# Patient Record
Sex: Male | Born: 1955 | Race: Black or African American | Hispanic: No | Smoking: Never smoker
Health system: Southern US, Community
[De-identification: ages and names within clinical notes are randomized; demographics above are authoritative.]

## PROBLEM LIST (undated history)

## (undated) DIAGNOSIS — I10 Essential (primary) hypertension: Secondary | ICD-10-CM

## (undated) DIAGNOSIS — G8929 Other chronic pain: Secondary | ICD-10-CM

## (undated) DIAGNOSIS — N400 Enlarged prostate without lower urinary tract symptoms: Secondary | ICD-10-CM

## (undated) DIAGNOSIS — I5030 Unspecified diastolic (congestive) heart failure: Secondary | ICD-10-CM

## (undated) DIAGNOSIS — E785 Hyperlipidemia, unspecified: Secondary | ICD-10-CM

## (undated) DIAGNOSIS — F419 Anxiety disorder, unspecified: Secondary | ICD-10-CM

## (undated) DIAGNOSIS — R51 Headache: Secondary | ICD-10-CM

## (undated) DIAGNOSIS — N189 Chronic kidney disease, unspecified: Secondary | ICD-10-CM

## (undated) DIAGNOSIS — F1910 Other psychoactive substance abuse, uncomplicated: Secondary | ICD-10-CM

## (undated) DIAGNOSIS — Z789 Other specified health status: Secondary | ICD-10-CM

## (undated) DIAGNOSIS — K469 Unspecified abdominal hernia without obstruction or gangrene: Secondary | ICD-10-CM

## (undated) DIAGNOSIS — G919 Hydrocephalus, unspecified: Secondary | ICD-10-CM

## (undated) DIAGNOSIS — J449 Chronic obstructive pulmonary disease, unspecified: Secondary | ICD-10-CM

## (undated) HISTORY — DX: Anxiety disorder, unspecified: F41.9

## (undated) HISTORY — DX: Other specified health status: Z78.9

## (undated) HISTORY — DX: Unspecified diastolic (congestive) heart failure: I50.30

## (undated) HISTORY — PX: VENTRICULOPERITONEAL SHUNT: SHX204

## (undated) HISTORY — DX: Hyperlipidemia, unspecified: E78.5

## (undated) HISTORY — DX: Headache: R51

## (undated) HISTORY — DX: Chronic kidney disease, unspecified: N18.9

## (undated) HISTORY — DX: Benign prostatic hyperplasia without lower urinary tract symptoms: N40.0

## (undated) HISTORY — DX: Other psychoactive substance abuse, uncomplicated: F19.10

## (undated) HISTORY — DX: Chronic obstructive pulmonary disease, unspecified: J44.9

## (undated) HISTORY — DX: Hydrocephalus, unspecified: G91.9

## (undated) HISTORY — PX: BRAIN SURGERY: SHX531

## (undated) HISTORY — PX: MANDIBLE SURGERY: SHX707

---

## 2003-05-03 ENCOUNTER — Encounter: Payer: Self-pay | Admitting: Unknown Physician Specialty

## 2003-05-03 ENCOUNTER — Ambulatory Visit (HOSPITAL_COMMUNITY): Admission: RE | Admit: 2003-05-03 | Discharge: 2003-05-03 | Payer: Self-pay | Admitting: Unknown Physician Specialty

## 2003-11-05 ENCOUNTER — Emergency Department (HOSPITAL_COMMUNITY): Admission: EM | Admit: 2003-11-05 | Discharge: 2003-11-05 | Payer: Self-pay | Admitting: Emergency Medicine

## 2004-08-21 ENCOUNTER — Ambulatory Visit: Payer: Self-pay | Admitting: Family Medicine

## 2004-11-02 ENCOUNTER — Ambulatory Visit: Payer: Self-pay | Admitting: Family Medicine

## 2005-02-12 ENCOUNTER — Ambulatory Visit: Payer: Self-pay | Admitting: Family Medicine

## 2005-04-02 ENCOUNTER — Ambulatory Visit: Payer: Self-pay | Admitting: Family Medicine

## 2005-06-14 ENCOUNTER — Ambulatory Visit: Payer: Self-pay | Admitting: Family Medicine

## 2005-08-15 ENCOUNTER — Ambulatory Visit: Payer: Self-pay | Admitting: Family Medicine

## 2005-11-19 ENCOUNTER — Ambulatory Visit: Payer: Self-pay | Admitting: Family Medicine

## 2005-12-14 ENCOUNTER — Ambulatory Visit (HOSPITAL_COMMUNITY): Admission: RE | Admit: 2005-12-14 | Discharge: 2005-12-14 | Payer: Self-pay

## 2006-03-05 ENCOUNTER — Ambulatory Visit: Payer: Self-pay | Admitting: Family Medicine

## 2006-09-10 ENCOUNTER — Ambulatory Visit: Payer: Self-pay | Admitting: Family Medicine

## 2006-10-02 ENCOUNTER — Ambulatory Visit: Payer: Self-pay | Admitting: Family Medicine

## 2006-11-08 ENCOUNTER — Ambulatory Visit: Payer: Self-pay | Admitting: Family Medicine

## 2006-11-15 ENCOUNTER — Ambulatory Visit: Payer: Self-pay | Admitting: Family Medicine

## 2006-11-25 ENCOUNTER — Ambulatory Visit: Payer: Self-pay | Admitting: Family Medicine

## 2007-02-06 ENCOUNTER — Ambulatory Visit: Payer: Self-pay | Admitting: Family Medicine

## 2009-10-03 ENCOUNTER — Encounter: Payer: Self-pay | Admitting: Cardiology

## 2009-10-04 ENCOUNTER — Encounter: Payer: Self-pay | Admitting: Cardiology

## 2009-10-07 ENCOUNTER — Encounter: Payer: Self-pay | Admitting: Cardiology

## 2009-10-08 ENCOUNTER — Ambulatory Visit: Payer: Self-pay | Admitting: Cardiology

## 2009-10-10 ENCOUNTER — Encounter: Payer: Self-pay | Admitting: Cardiology

## 2009-10-12 ENCOUNTER — Encounter: Payer: Self-pay | Admitting: Cardiology

## 2009-10-26 ENCOUNTER — Ambulatory Visit: Payer: Self-pay | Admitting: Cardiology

## 2009-10-26 DIAGNOSIS — E119 Type 2 diabetes mellitus without complications: Secondary | ICD-10-CM

## 2009-10-26 DIAGNOSIS — I509 Heart failure, unspecified: Secondary | ICD-10-CM | POA: Insufficient documentation

## 2009-10-26 DIAGNOSIS — I10 Essential (primary) hypertension: Secondary | ICD-10-CM | POA: Insufficient documentation

## 2009-11-03 ENCOUNTER — Ambulatory Visit: Payer: Self-pay | Admitting: Cardiology

## 2009-11-25 ENCOUNTER — Ambulatory Visit: Payer: Self-pay | Admitting: Cardiology

## 2009-12-02 ENCOUNTER — Ambulatory Visit: Payer: Self-pay | Admitting: Cardiology

## 2010-05-14 ENCOUNTER — Emergency Department (HOSPITAL_COMMUNITY): Admission: EM | Admit: 2010-05-14 | Discharge: 2010-05-14 | Payer: Self-pay | Admitting: Emergency Medicine

## 2010-06-14 ENCOUNTER — Inpatient Hospital Stay (HOSPITAL_COMMUNITY): Admission: EM | Admit: 2010-06-14 | Discharge: 2010-06-18 | Payer: Self-pay | Admitting: Emergency Medicine

## 2010-06-14 ENCOUNTER — Ambulatory Visit: Payer: Self-pay | Admitting: Cardiology

## 2010-06-15 ENCOUNTER — Encounter (INDEPENDENT_AMBULATORY_CARE_PROVIDER_SITE_OTHER): Payer: Self-pay | Admitting: Family Medicine

## 2010-08-10 ENCOUNTER — Encounter: Payer: Self-pay | Admitting: Physician Assistant

## 2010-08-10 ENCOUNTER — Emergency Department (HOSPITAL_COMMUNITY): Admission: EM | Admit: 2010-08-10 | Discharge: 2010-08-10 | Payer: Self-pay | Admitting: Emergency Medicine

## 2010-08-14 ENCOUNTER — Telehealth (INDEPENDENT_AMBULATORY_CARE_PROVIDER_SITE_OTHER): Payer: Self-pay | Admitting: *Deleted

## 2010-08-21 ENCOUNTER — Encounter: Payer: Self-pay | Admitting: Cardiology

## 2010-08-21 ENCOUNTER — Encounter: Payer: Self-pay | Admitting: Physician Assistant

## 2010-08-21 ENCOUNTER — Ambulatory Visit: Payer: Self-pay | Admitting: Cardiology

## 2010-08-21 ENCOUNTER — Inpatient Hospital Stay (HOSPITAL_COMMUNITY)
Admission: EM | Admit: 2010-08-21 | Discharge: 2010-08-23 | Payer: Self-pay | Source: Home / Self Care | Admitting: Emergency Medicine

## 2010-08-22 ENCOUNTER — Encounter: Payer: Self-pay | Admitting: Cardiology

## 2010-08-28 ENCOUNTER — Ambulatory Visit: Payer: Self-pay | Admitting: Physician Assistant

## 2010-08-31 ENCOUNTER — Encounter: Payer: Self-pay | Admitting: Physician Assistant

## 2010-09-04 ENCOUNTER — Encounter: Payer: Self-pay | Admitting: Physician Assistant

## 2010-09-25 ENCOUNTER — Emergency Department (HOSPITAL_COMMUNITY)
Admission: EM | Admit: 2010-09-25 | Discharge: 2010-09-25 | Payer: Self-pay | Source: Home / Self Care | Admitting: Emergency Medicine

## 2010-10-04 ENCOUNTER — Telehealth (INDEPENDENT_AMBULATORY_CARE_PROVIDER_SITE_OTHER): Payer: Self-pay | Admitting: *Deleted

## 2010-10-05 ENCOUNTER — Encounter: Payer: Self-pay | Admitting: Physician Assistant

## 2010-10-08 ENCOUNTER — Emergency Department (HOSPITAL_COMMUNITY)
Admission: EM | Admit: 2010-10-08 | Discharge: 2010-10-08 | Payer: Self-pay | Source: Home / Self Care | Admitting: Emergency Medicine

## 2010-10-09 LAB — DIFFERENTIAL
Basophils Absolute: 0 10*3/uL (ref 0.0–0.1)
Basophils Relative: 0 % (ref 0–1)
Eosinophils Absolute: 0.3 10*3/uL (ref 0.0–0.7)
Eosinophils Relative: 4 % (ref 0–5)
Lymphocytes Relative: 20 % (ref 12–46)
Lymphs Abs: 1.7 10*3/uL (ref 0.7–4.0)
Monocytes Absolute: 0.7 10*3/uL (ref 0.1–1.0)
Monocytes Relative: 8 % (ref 3–12)
Neutro Abs: 5.8 10*3/uL (ref 1.7–7.7)
Neutrophils Relative %: 68 % (ref 43–77)

## 2010-10-09 LAB — POCT CARDIAC MARKERS
CKMB, poc: 1.1 ng/mL (ref 1.0–8.0)
CKMB, poc: <1 ng/mL — ABNORMAL LOW (ref 1.0–8.0)
Myoglobin, poc: 65 ng/mL (ref 12–200)
Myoglobin, poc: 44.1 ng/mL (ref 12–200)
Troponin i, poc: <0.05 ng/mL (ref 0.00–0.09)
Troponin i, poc: <0.05 ng/mL (ref 0.00–0.09)

## 2010-10-09 LAB — GLUCOSE, CAPILLARY: Glucose-Capillary: 200 mg/dL — ABNORMAL HIGH (ref 70–99)

## 2010-10-09 LAB — BLOOD GAS, ARTERIAL
Acid-Base Excess: 1.7 mmol/L (ref 0.0–2.0)
Bicarbonate: 26.4 mEq/L — ABNORMAL HIGH (ref 20.0–24.0)
FIO2: 0.21 %
O2 Saturation: 94 %
Patient temperature: 37
TCO2: 23.2 mmol/L (ref 0–100)
pCO2 arterial: 46 mmHg — ABNORMAL HIGH (ref 35.0–45.0)
pH, Arterial: 7.376 (ref 7.350–7.450)
pO2, Arterial: 69.7 mmHg — ABNORMAL LOW (ref 80.0–100.0)

## 2010-10-09 LAB — BASIC METABOLIC PANEL
BUN: 12 mg/dL (ref 6–23)
CO2: 26 mEq/L (ref 19–32)
Calcium: 8.9 mg/dL (ref 8.4–10.5)
Chloride: 103 mEq/L (ref 96–112)
Creatinine, Ser: 0.91 mg/dL (ref 0.4–1.5)
GFR calc Af Amer: >60 mL/min (ref >60)
GFR calc non Af Amer: >60 mL/min (ref >60)
Glucose, Bld: 111 mg/dL — ABNORMAL HIGH (ref 70–99)
Potassium: 3.2 mEq/L — ABNORMAL LOW (ref 3.5–5.1)
Sodium: 139 mEq/L (ref 135–145)

## 2010-10-09 LAB — CBC
HCT: 41.4 % (ref 39.0–52.0)
Hemoglobin: 14.1 g/dL (ref 13.0–17.0)
MCH: 31.5 pg (ref 26.0–34.0)
MCHC: 34.1 g/dL (ref 30.0–36.0)
MCV: 92.6 fL (ref 78.0–100.0)
Platelets: 230 10*3/uL (ref 150–400)
RBC: 4.47 MIL/uL (ref 4.22–5.81)
RDW: 12.9 % (ref 11.5–15.5)
WBC: 8.6 10*3/uL (ref 4.0–10.5)

## 2010-10-09 LAB — TROPONIN I: Troponin I: 0.01 ng/mL (ref 0.00–0.06)

## 2010-10-09 LAB — CK TOTAL AND CKMB (NOT AT ARMC)
CK, MB: 2.5 ng/mL (ref 0.3–4.0)
Relative Index: 1.6 (ref 0.0–2.5)
Total CK: 153 U/L (ref 7–232)

## 2010-10-09 LAB — MAGNESIUM: Magnesium: 1.9 mg/dL (ref 1.5–2.5)

## 2010-10-09 LAB — BRAIN NATRIURETIC PEPTIDE: Pro B Natriuretic peptide (BNP): <30 pg/mL (ref 0.0–100.0)

## 2010-10-09 LAB — D-DIMER, QUANTITATIVE: D-Dimer, Quant: 0.22 ug/mL-FEU (ref 0.00–0.48)

## 2010-10-10 ENCOUNTER — Emergency Department (HOSPITAL_COMMUNITY)
Admission: EM | Admit: 2010-10-10 | Discharge: 2010-10-11 | Payer: Self-pay | Source: Home / Self Care | Admitting: Emergency Medicine

## 2010-10-11 ENCOUNTER — Encounter: Payer: Self-pay | Admitting: Cardiology

## 2010-10-24 NOTE — Assessment & Plan Note (Signed)
Summary: bp check  Nurse Visit   Vital Signs:  Patient profile:   55 year old male Height:      68 inches Weight:      243 pounds Pulse rate:   92 / minute BP sitting:   125 / 90  (left arm) Cuff size:   large  Vitals Entered By: Carlye Grippe (November 03, 2009 8:42 AM)  CC: nurse BP check  WN:[id number] HTN--yes YQI:[id number] meds?--yes Side effects?-no Chest pain, SOB, Dizziness?--mo A/P: 1. HTN (401.1)             At goal?              If no, physician will be notified.              Follow up in ...Marland KitchenMarland KitchenMarland Kitchen  5 minutes was spent with the patient.      Preventive Screening-Counseling & Management  Alcohol-Tobacco     Smoking Status: never  Visit Type:  nurse BP check  CC:  nurse BP check.   Current Medications (verified): 1)  Depakote Er 500 Mg Xr24h-Tab (Divalproex Sodium) .... Take 1 Tablet By Mouth Two Times A Day 2)  Glyburide 5 Mg Tabs (Glyburide) .... Take 1 Tablet By Mouth Once A Day 3)  Metformin Hcl 500 Mg Xr24h-Tab (Metformin Hcl) .... Take 1 Tablet By Mouth Once A Day 4)  Proventil Hfa 108 (90 Base) Mcg/act Aers (Albuterol Sulfate) .... As Needed 5)  Advair Diskus 500-50 Mcg/dose Aepb (Fluticasone-Salmeterol) .... One Inhalation Two Times A Day 6)  Chlorthalidone 25 Mg Tabs (Chlorthalidone) .... Take 1 Tablet By Mouth Once A Day 7)  Norvasc 10 Mg Tabs (Amlodipine Besylate) .... Take 1 Tablet By Mouth Once A Day 8)  Potassium Chloride Crys Cr 20 Meq Cr-Tabs (Potassium Chloride Crys Cr) .... Take One Tablet By Mouth Daily  Allergies (verified): 1)  ! * Morphine  Comments:  Nurse/Medical Assistant: The patient's medications and allergies were reviewed with the patient and were updated in the Medication and Allergy Lists. Bottles reviewed.  Orders Added: 1)  Est. Patient Level I [95638]

## 2010-10-24 NOTE — Letter (Signed)
Summary: [facility] D/C DR.JAMES PARSONS  [facility] D[facility]C DR[facility]JAMES PARSONS   Imported By: Zachary George 10/21/2009 14:42:09  _____________________________________________________________________  External Attachment:    Type:   Image     Comment:   External Document

## 2010-10-24 NOTE — Assessment & Plan Note (Signed)
Summary: bp check  --agh  Nurse Visit   Vital Signs:  Patient profile:   55 year old male Weight:      249.50 pounds Pulse rate:   63 / minute BP sitting:   123 / 79  (left arm) Cuff size:   large  Vitals Entered By: Hoover Brunette, LPN (December 02, 2009 4:53 PM) Comments Reviewed meds with pt. and doing well.     Allergies: 1)  ! * Morphine

## 2010-10-24 NOTE — Assessment & Plan Note (Signed)
Summary: new hosp fu- d/c [facility]   Visit Type:  Initial Consult Primary Provider:  Dr. Lysbeth Galas  CC:  hospital follow-up visit CHF.  History of Present Illness: the patient is a 55 year old male with history of asthmatic bronchitis. Patient was initially hospitalized on 1:15 2011 with significant shortness of breath. He also reported atypical left-sided chest pain. The patient was ruled out for myocardial infarction. An echocardiogram was obtained and demonstrated an ejection fraction of 60% but with diastolic dysfunction. The patient was initially treated with diuretics and ACE inhibitor which caused a rise in his creatinine. ACE Inhibitor was discontinued. The patient reports function studies done which showed severe obstructive defect with an FEV1 of 35%. The patient does endorse as a long-standing history of asthma. However he feels that more recently he has developed increasing shortness of breath particularly noted during exertion. He states that sometimes there is associated severe left-sided chest pain. Rest seems to relieve the symptoms. The patient has significant hypertension is poorly controlled again in the office today. Upon further questioning the patient he admitted that he has been noncompliant with his medical therapy. As a matter of fact he never filled his amlodipine or hydrochlorothiazide. The patient is also exposed to secondhand smoke from his wife was on oxygen therapy. The patient is not aware of his cholesterol levels. He denies any orthopnea PND he has no palpitations or syncope.  Preventive Screening-Counseling & Management  Alcohol-Tobacco     Smoking Status: never  Current Problems (verified): 1)  Shortness of Breath  (ICD-786.05) 2)  Chronic Obstructive Asthma With Exacerbation  (ICD-493.22) 3)  Acute Diastolic Heart Failure  (ICD-428.31) 4)  CHF  (ICD-428.0) 5)  Hypertension  (ICD-401.9) 6)  Dm  (ICD-250.00) 7)  Place of Occurrence [facility]   (ICD-E849.7) 8)  Unspecified Disorder of Kidney and Ureter  (ICD-593.9) 9)  Hypopotassemia  (ICD-276.8) 10)  Presence of Cerebrospinal Fluid Drainage Device  (ICD-V45.2)  Current Medications (verified): 1)  Amlodipine Besylate 10 Mg Tabs (Amlodipine Besylate) .... Take 1 Tablet By Mouth Once A Day 2)  Potassium Chloride Crys Cr 20 Meq Cr-Tabs (Potassium Chloride Crys Cr) .... Take 1 Tablet By Mouth Once A Day 3)  Hydrochlorothiazide 25 Mg Tabs (Hydrochlorothiazide) .... Take 1 Tablet By Mouth Once A Day 4)  Depakote Er 500 Mg Xr24h-Tab (Divalproex Sodium) .... Take 1 Tablet By Mouth Two Times A Day 5)  Glyburide 5 Mg Tabs (Glyburide) .... Take 1 Tablet By Mouth Once A Day 6)  Metformin Hcl 500 Mg Xr24h-Tab (Metformin Hcl) .... Take 1 Tablet By Mouth Once A Day 7)  Proventil Hfa 108 (90 Base) Mcg/act Aers (Albuterol Sulfate) .... As Needed 8)  Advair Diskus 500-50 Mcg/dose Aepb (Fluticasone-Salmeterol) .... One Inhalation Two Times A Day 9)  Chlorthalidone 25 Mg Tabs (Chlorthalidone) .... Take 1 Tablet By Mouth Once A Day 10)  Norvasc 10 Mg Tabs (Amlodipine Besylate) .... Take 1 Tablet By Mouth Once A Day 11)  Potassium Chloride Crys Cr 20 Meq Cr-Tabs (Potassium Chloride Crys Cr) .... Take One Tablet By Mouth Daily  Allergies: 1)  ! * Morphine  Comments:  Nurse/Medical Assistant: The patient's medications and allergies were reviewed with the patient and were updated in the Medication and Allergy Lists. Bottles reviewed.  Past History:  Past Medical History: Last updated: 10/26/2009 shortness of breath Asthma, Bronchitis Mild CHF Hypertension diabetes Mellitus, type 2 noninsulin dependent Transient renal insufficiency Seizure disorder Hypokalemia  Past Surgical History: Last updated: 10/26/2009 Jaw broke status  post surgical repair VP shunt   Family History: Last updated: 10/26/2009 Father: had CAD and recv coronary artery bypass surgery father died of lung cancer age  86  Social History: Last updated: 10/26/2009 Disabled  Divorced  Lives with a significant other for the past 15 years has 2 grown children No cigarette smoking Past use of alcohol He denies illicit drug use.  Risk Factors: Smoking Status: never (10/26/2009)  Social History: Smoking Status:  never  Review of Systems       The patient complains of chest pain, shortness of breath, and wheezing.  The patient denies fatigue, malaise, fever, weight gain/loss, vision loss, decreased hearing, hoarseness, palpitations, prolonged cough, sleep apnea, coughing up blood, abdominal pain, blood in stool, nausea, vomiting, diarrhea, heartburn, incontinence, blood in urine, muscle weakness, joint pain, leg swelling, rash, skin lesions, headache, fainting, dizziness, depression, anxiety, enlarged lymph nodes, easy bruising or bleeding, and environmental allergies.    Vital Signs:  Patient profile:   55 year old male Height:      68 inches Weight:      241 pounds BMI:     36.78 Pulse rate:   76 / minute BP sitting:   157 / 100  (left arm)  Vitals Entered By: Carlye Grippe (October 26, 2009 9:59 AM)  Nutrition Counseling: Patient's BMI is greater than 25 and therefore counseled on weight management options.  Serial Vital Signs/Assessments:  Time      Position  BP       Pulse  Resp  Temp     By 10:06 AM            147/96   68                    Carlye Grippe  CC: hospital follow-up visit CHF   Physical Exam  Additional Exam:  General: Well-developed, well-nourished in no distress head: Normocephalic and atraumatic eyes PERRLA/EOMI intact, conjunctiva and lids normal nose: No deformity or lesions mouth normal dentition, normal posterior pharynx neck: Supple, no JVD.  No masses, thyromegaly or abnormal cervical nodes lungs: Normal breath sounds bilaterally without wheezing.  Normal percussion heart: regular rate and rhythm with normal S1 and S2, no S3 or S4.  PMI is normal.  No  pathological murmurs abdomen: Normal bowel sounds, abdomen is soft and nontender without masses, organomegaly or hernias noted.  No hepatosplenomegaly musculoskeletal: Back normal, normal gait muscle strength and tone normal pulsus: Pulse is normal in all 4 extremities Extremities: No peripheral pitting edema neurologic: Alert and oriented x 3 skin: Intact without lesions or rashes cervical nodes: No significant adenopathy psychologic: Normal affect    Impression & Recommendations:  Problem # 1:  SHORTNESS OF BREATH (ICD-786.05) the patient's shortness of breath is likely related to his underlying asthmatic bronchitis.his PFTs are very abnormal. There are consistent obstructive pulmonary disease. However it is possible that the patient has a component of diastolic heart failure related to hypertensive heart disease. His blood pressure is poorly controlled. He has been noncompliant with his medications. We will first try to control his blood pressure and if he remains symptomatic consider exercise stress testing. His updated medication list for this problem includes:    Amlodipine Besylate 10 Mg Tabs (Amlodipine besylate) .Marland Kitchen... Take 1 tablet by mouth once a day    Hydrochlorothiazide 25 Mg Tabs (Hydrochlorothiazide) .Marland Kitchen... Take 1 tablet by mouth once a day    Chlorthalidone 25 Mg Tabs (Chlorthalidone) .Marland Kitchen... Take 1 tablet  by mouth once a day    Norvasc 10 Mg Tabs (Amlodipine besylate) .Marland Kitchen... Take 1 tablet by mouth once a day  Problem # 2:  HYPERTENSION (ICD-401.9) patient endorses that is not compliant with his medications. We have refilled amlodipine and change hydrochlorothiazide to chlorthalidone. The patient will come back in one week for an R.N. visit to check his blood pressure. He will see me back in one month to make a decision whether he needs further up titration of medication or additional stress testing. Potassium will be supplemented 20 milliequivalents p.o. q. daily. His updated  medication list for this problem includes:    Amlodipine Besylate 10 Mg Tabs (Amlodipine besylate) .Marland Kitchen... Take 1 tablet by mouth once a day    Hydrochlorothiazide 25 Mg Tabs (Hydrochlorothiazide) .Marland Kitchen... Take 1 tablet by mouth once a day    Chlorthalidone 25 Mg Tabs (Chlorthalidone) .Marland Kitchen... Take 1 tablet by mouth once a day    Norvasc 10 Mg Tabs (Amlodipine besylate) .Marland Kitchen... Take 1 tablet by mouth once a day  Problem # 3:  CHF (ICD-428.0) chronic diastolic heart failure but unable to tolerate ACE inhibitor. Focus of treatment will be on hypertension. His updated medication list for this problem includes:    Amlodipine Besylate 10 Mg Tabs (Amlodipine besylate) .Marland Kitchen... Take 1 tablet by mouth once a day    Hydrochlorothiazide 25 Mg Tabs (Hydrochlorothiazide) .Marland Kitchen... Take 1 tablet by mouth once a day    Chlorthalidone 25 Mg Tabs (Chlorthalidone) .Marland Kitchen... Take 1 tablet by mouth once a day    Norvasc 10 Mg Tabs (Amlodipine besylate) .Marland Kitchen... Take 1 tablet by mouth once a day  Patient Instructions: 1)  Chlorthalidone 25mg  daily 2)  Norvasc 10mg  daily 3)  K-Dur daily 4)  Nurse visit in one week for blood pressure check 5)  Follow up in  1 month Prescriptions: POTASSIUM CHLORIDE CRYS CR 20 MEQ CR-TABS (POTASSIUM CHLORIDE CRYS CR) Take one tablet by mouth daily  #30 x 6   Entered by:   Hoover Brunette, LPN   Authorized by:   Lewayne Bunting, MD, [facility]   Signed by:   Hoover Brunette, LPN on 14/78/2956   Method used:   Electronically to        [facility] [phone]* (retail)       [address]       [place], [place]  [address]       Ph: [phone] or [phone]       Fax: [phone]   RxID:   [id number]   Handout requested. NORVASC 10 MG TABS (AMLODIPINE BESYLATE) Take 1 tablet by mouth once a day  #30 x 6   Entered by:   Hoover Brunette, LPN   Authorized by:   Lewayne Bunting, MD, [facility]   Signed by:   Hoover Brunette, LPN on 95/18/8416   Method used:   [facility]ronically to        [facility] [phone]* (retail)       [address]       [place], [place]  [address]       Ph: [phone] or [phone]       Fax: [phone]   RxID:   [id number]   Handout requested. CHLORTHALIDONE 25 MG TABS (CHLORTHALIDONE) Take 1 tablet by mouth once a day  #30 x 6   Entered by:   Hoover Brunette,  LPN   Authorized by:   Lewayne Bunting, MD, [facility]   Signed by:   Hoover Brunette, LPN on 54/05/8118   Method used:   [facility]ronically to        [facility] [phone]* (retail)       [address]       [place], [place]  [address]       Ph: [phone] or [phone]       Fax: [phone]   RxID:   [id number]   Handout requested.   Appended Document: new hosp fu- d/c [facility] need to discontinue hydrochlorothiazide, because restarted on chlorthalidone per Dr. Andee Lineman.    Appended Document: new hosp fu- d/c [facility] Patient notified.   Patient verbalized understanding.

## 2010-10-24 NOTE — Assessment & Plan Note (Signed)
Summary: 1 mo fu -srs   Visit Type:  Follow-up Primary Provider:  Dr. Lysbeth Galas  CC:  follow-up visit.  History of Present Illness: the patient is a 55 year old male with history of asthmatic bronchitis.  The patient also severe hypertension.  He has been seen for dyspnea.  He svelte of diastolic heart failure related to hypertensive heart disease.  Is initially not been compliant with medications.  He has not been able to tolerate an ACE inhibitor.  He now presents for follow-up.  He denies any chest pain.  He states has less shortness of breath.  His blood pressure however still poorly controlled.  He has no palpitations or syncope.  He has no orthopnea PND.  the patient also has a VP shunt in place.  He is followed at [facility].  Preventive Screening-Counseling & Management  Alcohol-Tobacco     Smoking Status: never  Clinical Review Panels:  CXR CXR results            CHEST - 2 VIEW                             Comparison: 10/03/2009                   Findings: Right basilar atelectasis versus consolidation is         identified.         Upper limits normal heart size again noted.         No evidence of definite pleural effusion or pneumothorax noted.         A catheter overlying the right chest is again noted.         No acute bony abnormalities are identified.                   IMPRESSION:         Right basilar atelectasis versus consolidation.                                           Reported By: JEFF T HU, MD           (10/07/2009)  Echocardiogram Echocardiogram Transthoracic Echocardiogram                    Conclusions:         1. The estimated ejection fraction is 60-65%.          2. Moderate concentric left ventricular hypertrophy is observed.         3. The left atrial chamber size is normal.         4. Abnormal left ventricular diastolic function is observed.         5. There is an E to A reversal in the mitral valve flow pattern  suggestive of diastolic dysfunction.         6. The aortic valve structure is normal.         7. The mitral valve leaflets appear normal.         8. There is mild tricuspid regurgitation.         9. There is a trace pulmonic regurgitation.          10. The inferior vena cava appears normal.  Caymen Dubray NMN Maxine Fredman, MD       (10/10/2009)    Current Medications (verified): 1)  Glyburide 5 Mg Tabs (Glyburide) .... Take 1 Tablet By Mouth Once A Day 2)  Proventil Hfa 108 (90 Base) Mcg/act Aers (Albuterol Sulfate) .... As Needed 3)  Advair Diskus 500-50 Mcg/dose Aepb (Fluticasone-Salmeterol) .... One Inhalation Two Times A Day 4)  Chlorthalidone 25 Mg Tabs (Chlorthalidone) .... Take 1 Tablet By Mouth Once A Day 5)  Norvasc 10 Mg Tabs (Amlodipine Besylate) .... Take 1 Tablet By Mouth Once A Day 6)  Potassium Chloride Crys Cr 20 Meq Cr-Tabs (Potassium Chloride Crys Cr) .... Take One Tablet By Mouth Daily 7)  Clonidine Hcl 0.1 Mg Tabs (Clonidine Hcl) .... Take 1 Tablet By Mouth Once A Day  Allergies (verified): 1)  ! * Morphine  Comments:  Nurse/Medical Assistant: The patient's medications and allergies were reviewed with the patient and were updated in the Medication and Allergy Lists. Bottles reviewed.  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  Risk Factors: Smoking Status: never (11/25/2009)  Family History: Reviewed history from 10/26/2009 and no changes required. Father: had CAD and recv coronary artery bypass surgery father died of lung cancer age 54  Social History: Reviewed history from 10/26/2009 and no changes required. Disabled  Divorced  Lives with a significant other for the past 15 years has 2 grown children No cigarette smoking Past use of alcohol He denies illicit drug use.  Review of Systems       The patient complains of shortness of breath.  The patient denies fatigue, malaise, fever, weight gain/loss, vision loss, decreased hearing, hoarseness, chest pain, palpitations, prolonged cough, wheezing, sleep apnea, coughing up blood, abdominal pain, blood in stool, nausea, vomiting, diarrhea, heartburn, incontinence, blood in urine, muscle weakness, joint pain, leg swelling, rash, skin lesions, headache, fainting, dizziness, depression, anxiety, enlarged lymph nodes, easy bruising or bleeding, and environmental allergies.    Vital Signs:  Patient profile:   55 year old male Height:      68 inches Weight:      245 pounds Pulse rate:   64 / minute BP sitting:   151 / 99  (left arm) Cuff size:   large  Vitals Entered By: Carlye Grippe (November 25, 2009 8:36 AM)  Serial Vital Signs/Assessments:  Time      Position  BP       Pulse  Resp  Temp     By 8:41 AM             148/94   62                    Carlye Grippe  CC: follow-up visit   Physical Exam  Additional Exam:  General: Well-developed, well-nourished in no distress head: Normocephalic and atraumatic eyes PERRLA/EOMI intact, conjunctiva and lids normal nose: No deformity or lesions mouth normal dentition, normal posterior pharynx neck:  Supple, no JVD.  No masses, thyromegaly or abnormal cervical nodes lungs: Normal breath sounds bilaterally without wheezing.  Normal percussion heart: regular rate and rhythm with normal S1 and S2,  no S3 or S4.  PMI is normal.  No pathological murmurs abdomen: Normal bowel sounds, abdomen is soft and nontender without masses, organomegaly or hernias noted.  No hepatosplenomegaly musculoskeletal: Back normal, normal gait muscle strength and tone normal pulsus: Pulse is normal in all 4 extremities Extremities: No peripheral pitting edema neurologic: Alert and oriented x 3 skin: Intact without lesions or rashes cervical nodes: No significant adenopathy psychologic: Normal affect    Impression & Recommendations:  Problem # 1:  SHORTNESS OF BREATH (ICD-786.05) the patient's dyspnea has improved.  I do not think we need to proceed with exercise testing at the present time but continue with Ferderer blood pressure control. His updated medication list for this problem includes:    Chlorthalidone 25 Mg Tabs (Chlorthalidone) .Marland Kitchen... Take 1 tablet by mouth once a day    Norvasc 10 Mg Tabs (Amlodipine besylate) .Marland Kitchen... Take 1 tablet by mouth once a day  Problem # 2:  HYPERTENSION (ICD-401.9) the patient's blood pressure remains poorly controlled.  We will add clonidine .1 mg p.o. daily.  He will have a follow-up RN visit for blood pressure check. His updated medication list for this problem includes:    Chlorthalidone 25 Mg Tabs (Chlorthalidone) .Marland Kitchen... Take 1 tablet by mouth once a day    Norvasc 10 Mg Tabs (Amlodipine besylate) .Marland Kitchen... Take 1 tablet by mouth once a day    Clonidine Hcl 0.1 Mg Tabs (Clonidine hcl) .Marland Kitchen... Take 1 tablet by mouth once a day  Problem # 3:  CHRONIC OBSTRUCTIVE ASTHMA WITH EXACERBATION (ICD-493.22) the patient's dyspnea is a large part related to asthmatic bronchitis.  He has not been able to tolerate an ACE inhibitor.  Pulmonary function studies showed severe obstructive defect with FEV1 of 35%.  He is not compliant with his inhalers. His updated medication list for this problem includes:    Proventil Hfa 108 (90 Base) Mcg/act Aers (Albuterol sulfate) .Marland Kitchen... As needed     Advair Diskus 500-50 Mcg/dose Aepb (Fluticasone-salmeterol) ..... One inhalation two times a day  Problem # 4:  PRESENCE OF CEREBROSPINAL FLUID DRAINAGE DEVICE (ICD-V45.2)  Patient Instructions: 1)  Clonidine 0.1mg  daily  2)  Nurse visit in one week to recheck blood pressure.   3)  Follow up in  6 months. Prescriptions: CLONIDINE HCL 0.1 MG TABS (CLONIDINE HCL) Take 1 tablet by mouth once a day  #30 x 6   Entered by:   Hoover Brunette, LPN   Authorized by:   Lewayne Bunting, MD, [facility]   Signed by:   Hoover Brunette, LPN on 19/14/7829   Method used:   [facility]ronically to        [facility] [phone]* (retail)       [address]       [place], [place]  [address]       Ph: [phone] or [phone]       Fax: [phone]   RxID:   [id number]   Handout requested.

## 2010-10-24 NOTE — Assessment & Plan Note (Signed)
Summary: f81m & f/u on er visit - chest pain  --agh   Visit Type:  Follow-up Primary Provider:  Dr. Lysbeth Galas   History of Present Illness: patient presents for post hospital followup.  He was recently briefly hospitalized at [facility], discharged 11/30. He presented with chest pain, ruled out for MI, and had a low risk exercise Myoview, reviewed by Dr. Diona Browner. A 2-D echo indicated hyperdynamic LVF (EF 65-70%), with no focal wall motion abnormalities; moderate LVH.  Notable lab findings: Potassium 3.2, negative d-dimer, and LDL 115.  Patient has had followup with Dr. Lysbeth Galas, but has not had any post hospital labs.  Patient complains of constant chest pain for the past 2 days, exacerbated by palpation. He reports compliance with his medications. Patient has never smoked tobacco.  Preventive Screening-Counseling & Management  Alcohol-Tobacco     Smoking Status: never  Current Medications (verified): 1)  Glyburide 5 Mg Tabs (Glyburide) .... Take 1 Tablet By Mouth Once A Day 2)  Proventil Hfa 108 (90 Base) Mcg/act Aers (Albuterol Sulfate) .... As Needed 3)  Advair Diskus 500-50 Mcg/dose Aepb (Fluticasone-Salmeterol) .... One Inhalation Two Times A Day 4)  Norvasc 10 Mg Tabs (Amlodipine Besylate) .... Take 1 Tablet By Mouth Once A Day 5)  Depakote 500 Mg Tbec (Divalproex Sodium) .... Take 1 Tablet By Mouth Two Times A Day 6)  Lisinopril 10 Mg Tabs (Lisinopril) .... Take 1 Tablet By Mouth Once A Day 7)  Vicodin 5-500 Mg Tabs (Hydrocodone-Acetaminophen) .... Take One By Mouth Four Times A Day As Needed 8)  Spiriva Handihaler 18 Mcg Caps (Tiotropium Bromide Monohydrate) .... One Inhalation Daily 9)  Aspir-Low 81 Mg Tbec (Aspirin) .... Take 1 Tablet By Mouth Once A Day  Allergies (verified): 1)  ! * Morphine  Comments:  Nurse/Medical Assistant: The patient's medication list and allergies were reviewed with the patient and were updated in the Medication and Allergy Lists.  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 All other systems negative.   Vital Signs:  Patient profile:   55 year old male Height:      68 inches Weight:      243 pounds BMI:     37.08 Pulse rate:   81 / minute BP sitting:   152 / 91  (left arm) Cuff size:   large  Vitals Entered By: Carlye Grippe (August 28, 2010 10:16 AM)  Nutrition Counseling: Patient's BMI is greater than 25 and therefore counseled on weight management options.  Physical Exam  Additional Exam:  GEN: 55 year old male, no distress HEENT: NCAT,PERRLA,EOMI NECK: palpable pulses, no bruits; no JVD; no TM LUNGS: CTA bilaterally HEART: RRR (S1S2); no significant murmurs; no rubs; no gallops ABD: soft, NT; intact BS EXT: intact distal pulses; no edema SKIN: warm, dry MUSC: no obvious deformity NEURO: A/O (x3)     EKG  Procedure date:  08/28/2010  Findings:      normal sinus rhythm with first degree AV block at 68 bpm LVH by voltage criteria; no acute changes  Impression & Recommendations:  Problem # 1:  CHEST PAIN (ICD-786.50)  patient presents with chest pain syndrome. Recently hospitalized at [facility], ruled out for MI, and had a low risk stress Myoview. A  2-D echo indicated hyperdynamic LVF with moderate LVH, and no focal wall motion abnormalities. His chest pain today is atypical, constant, and exacerbated by palpation. A 2-D echocardiogram indicated no acute changes. Therefore, no further cardiac testing is warranted. Patient needs aggressive primary prevention, including blood pressure and diabetes control. Also needs to be started on a statin. We will have him return  to Dr. Andee Lineman, on an as needed basis. Of note, he asked me for a pain medication; I advised him to return to Dr. Lysbeth Galas for management of this issue.  Problem # 2:  DM (ICD-250.00)  patient needs to be on a statin. We will start Lipitor 20 mg daily (generic), with recommended followup FLP/LFT profile in 12 weeks, with Dr. Joyce Copa office. Recommend target LDL 70 or less, if feasible.  Problem # 3:  HYPERTENSION (ICD-401.9)  aggressive BP control recommended, with ongoing monitoring and management, per Dr. Lysbeth Galas.  Problem # 4:  HYPOKALEMIA (ICD-276.8)  Will order post hospital labs, following recent discharge low of 3.2. Further monitoring and management, per Dr. Lysbeth Galas.  Other Orders: T-Basic Metabolic Panel [phone])  Patient Instructions: 1)  Follow up with Dr. Earnestine Leys as needed. 2)  Start Lipitor 20mg  by mouth at bedtime.  3)  Have Dr. Lysbeth Galas do cholesterol/liver function labs in 12 weeks and prescribe Lipitor in the future.  Prescriptions: LIPITOR 20 MG TABS (ATORVASTATIN CALCIUM) Take one tablet by mouth at bedtime.  #30 x 2   Entered by:   Cyril Loosen, RN, BSN   Authorized by:   Nelida Meuse, PA-C   Signed by:   Cyril Loosen, RN, BSN on 08/28/2010   Method used:   [facility] to        [facility] [phone][facility] (retail)       [address]       [place], [place]  [address]       Ph: [phone] or [phone]       Fax: [phone]   RxID:   [id number] LIPITOR 80 MG TABS (ATORVASTATIN CALCIUM) Take one tablet by mouth at bedtime.  #30 x 2   Entered by:   Cyril Loosen, RN, BSN   Authorized by:   Nelida Meuse, PA-C   Signed by:   Cyril Loosen, RN, BSN on 08/28/2010   Method used:   Electronically to        [facility] [phone]* (retail)       [address]       [place], [place]  [address]       Ph: [phone] or [phone]       Fax: [phone]   RxID:    [id number]   Handout requested.   I have reviewed and approved all prescriptions at the time of the office visit. Nelida Meuse, PA-C  August 28, 2010 11:13 AM

## 2010-10-24 NOTE — Progress Notes (Signed)
Summary: CHEST HURTUNG  Phone Note Call from Patient Call back at Home Phone [phone]   Caller: Patient Reason for Call: Talk to Nurse Summary of Call: PATIENT EXPERIENCING CHEST TIGHTNESS & HURTING REALLY BAD.  SHARP PAINS GOING DOWN HIS ARM  HAS BEEN TO [place] ED AND WAS TOLD IT WAS HIS MUSCLES  TELEPHONE  [phone] Initial call taken by: Claudette Laws,  August 14, 2010 1:00 PM  Follow-up for Phone Call        Spoke with patient and states that he has had this pain all day.  Does not have NTG.  Was seen in ED at [facility] and PMD.  Was told muscle related and give pain med.  Advised pt to go back to ED or call 911 if symptoms worsen.  Scheduled OV for 12/5 at 10:20 with GS.  This visit will serve as his 6 month f/u since he was due in September and f/u on chest pain.  Patient verbalized understanding.  Follow-up by: Hoover Brunette, LPN,  August 14, 2010 4:18 PM

## 2010-10-26 NOTE — Medication Information (Signed)
Summary: RX Folder/ PRIOR AUTHORIZATION ATORVASTATIN  RX Folder/ PRIOR AUTHORIZATION ATORVASTATIN   Imported By: Dorise Hiss 10/05/2010 14:09:30  _____________________________________________________________________  External Attachment:    Type:   Image     Comment:   External Document

## 2010-10-26 NOTE — Progress Notes (Signed)
Summary: PHONE:   Phone Note Call from Patient Call back at Home Phone [phone]   Caller: SELF Details for Reason: Returned call Summary of Call: Mr. Spadafore is returning your telephone call regarding his cholesterol Initial call taken by: Zachary George,  October 04, 2010 10:56 AM  Follow-up for Phone Call        spoke with patient r/e questions of cholesterol medications. Follow-up by: Carlye Grippe,  October 04, 2010 3:35 PM

## 2010-10-26 NOTE — Miscellaneous (Signed)
Summary: Orders Update-BMET, MAGNESIUM  Clinical Lists Changes  Orders: Added new Test order of T-Basic Metabolic Panel [phone]) - Signed Added new Test order of T-Magnesium [phone]) - Signed

## 2010-10-26 NOTE — Medication Information (Signed)
Summary: RX Folder/ ATORVASTATIN APPROVED FOR 1 YR  RX Folder/ ATORVASTATIN APPROVED FOR 1 YR   Imported By: Dorise Hiss 10/05/2010 15:03:49  _____________________________________________________________________  External Attachment:    Type:   Image     Comment:   External Document

## 2010-10-26 NOTE — Miscellaneous (Signed)
Summary: rx - potassium  Clinical Lists Changes  Medications: Added new medication of POTASSIUM CHLORIDE CRYS CR 20 MEQ CR-TABS (POTASSIUM CHLORIDE CRYS CR) take 2 tabs ( ) daily - Signed Rx of POTASSIUM CHLORIDE CRYS CR 20 MEQ CR-TABS (POTASSIUM CHLORIDE CRYS CR) take 2 tabs ( ) daily;  #60 x 6;  Signed;  Entered by: Hoover Brunette, LPN;  Authorized by: Nelida Meuse, PA-C;  Method used: Electronically to [facility] [phone]*, [address], [place], [place], [place]  [address], Ph: [phone] or [phone], Fax: [phone]    Prescriptions: POTASSIUM CHLORIDE CRYS CR 20 MEQ CR-TABS (POTASSIUM CHLORIDE CRYS CR) take 2 tabs ( ) daily  #60 x 6   Entered by:   Hoover Brunette, LPN   Authorized by:   Nelida Meuse, PA-C   Signed by:   Hoover Brunette, LPN on 44/11/4740   Method used:   Electronically to        [facility] [phone]* (retail)       [address]       [place], [place]  [address]       Ph: [phone] or [phone]       Fax: [phone]   RxID:   [id number]

## 2010-10-26 NOTE — Consult Note (Signed)
Summary: [facility]  [facility]   Imported By: Marylou Mccoy 09/13/2010 12:28:46  _____________________________________________________________________  External Attachment:    Type:   Image     Comment:   External Document

## 2010-11-02 ENCOUNTER — Emergency Department (HOSPITAL_COMMUNITY)
Admission: EM | Admit: 2010-11-02 | Discharge: 2010-11-02 | Disposition: A | Discharge status: Home / Self Care | Payer: Medicaid Other | Attending: Emergency Medicine | Admitting: Emergency Medicine

## 2010-11-02 ENCOUNTER — Emergency Department (HOSPITAL_COMMUNITY): Payer: Medicaid Other

## 2010-11-02 DIAGNOSIS — R05 Cough: Secondary | ICD-10-CM | POA: Insufficient documentation

## 2010-11-02 DIAGNOSIS — R059 Cough, unspecified: Secondary | ICD-10-CM | POA: Insufficient documentation

## 2010-11-02 DIAGNOSIS — I10 Essential (primary) hypertension: Secondary | ICD-10-CM | POA: Insufficient documentation

## 2010-11-02 DIAGNOSIS — J449 Chronic obstructive pulmonary disease, unspecified: Secondary | ICD-10-CM | POA: Insufficient documentation

## 2010-11-02 DIAGNOSIS — Z79899 Other long term (current) drug therapy: Secondary | ICD-10-CM | POA: Insufficient documentation

## 2010-11-02 DIAGNOSIS — E119 Type 2 diabetes mellitus without complications: Secondary | ICD-10-CM | POA: Insufficient documentation

## 2010-11-02 DIAGNOSIS — J4489 Other specified chronic obstructive pulmonary disease: Secondary | ICD-10-CM | POA: Insufficient documentation

## 2010-11-02 DIAGNOSIS — R079 Chest pain, unspecified: Secondary | ICD-10-CM | POA: Insufficient documentation

## 2010-11-02 LAB — CBC
HCT: 43.4 % (ref 39.0–52.0)
Hemoglobin: 15 g/dL (ref 13.0–17.0)
MCH: 31.7 pg (ref 26.0–34.0)
RBC: 4.73 MIL/uL (ref 4.22–5.81)

## 2010-11-02 LAB — POCT CARDIAC MARKERS
CKMB, poc: 2.5 ng/mL (ref 1.0–8.0)
CKMB, poc: 2.8 ng/mL (ref 1.0–8.0)
Myoglobin, poc: 162 ng/mL (ref 12–200)
Myoglobin, poc: 128 ng/mL (ref 12–200)

## 2010-11-02 LAB — DIFFERENTIAL
Basophils Relative: 0 % (ref 0–1)
Lymphocytes Relative: 22 % (ref 12–46)
Lymphs Abs: 2.1 10*3/uL (ref 0.7–4.0)
Monocytes Absolute: 0.8 10*3/uL (ref 0.1–1.0)
Monocytes Relative: 8 % (ref 3–12)
Neutro Abs: 6.5 10*3/uL (ref 1.7–7.7)
Neutrophils Relative %: 70 % (ref 43–77)

## 2010-11-02 LAB — BASIC METABOLIC PANEL
CO2: 24 mEq/L (ref 19–32)
Chloride: 105 mEq/L (ref 96–112)
Creatinine, Ser: 1.18 mg/dL (ref 0.4–1.5)
GFR calc Af Amer: >60 mL/min (ref >60)
Glucose, Bld: 110 mg/dL — ABNORMAL HIGH (ref 70–99)

## 2010-11-02 LAB — D-DIMER, QUANTITATIVE: D-Dimer, Quant: 0.24 ug/mL-FEU (ref 0.00–0.48)

## 2010-11-05 ENCOUNTER — Emergency Department (HOSPITAL_COMMUNITY): Payer: Medicaid Other

## 2010-11-05 ENCOUNTER — Inpatient Hospital Stay (HOSPITAL_COMMUNITY)
Admission: EM | Admit: 2010-11-05 | Discharge: 2010-11-07 | DRG: 202 | Disposition: A | Discharge status: Home / Self Care | Payer: Medicaid Other | Attending: Internal Medicine | Admitting: Internal Medicine

## 2010-11-05 DIAGNOSIS — T380X5A Adverse effect of glucocorticoids and synthetic analogues, initial encounter: Secondary | ICD-10-CM | POA: Diagnosis present

## 2010-11-05 DIAGNOSIS — Z6835 Body mass index (BMI) 35.0-35.9, adult: Secondary | ICD-10-CM

## 2010-11-05 DIAGNOSIS — IMO0001 Reserved for inherently not codable concepts without codable children: Secondary | ICD-10-CM | POA: Diagnosis present

## 2010-11-05 DIAGNOSIS — E876 Hypokalemia: Secondary | ICD-10-CM | POA: Diagnosis present

## 2010-11-05 DIAGNOSIS — J45901 Unspecified asthma with (acute) exacerbation: Principal | ICD-10-CM | POA: Diagnosis present

## 2010-11-05 DIAGNOSIS — D72829 Elevated white blood cell count, unspecified: Secondary | ICD-10-CM | POA: Diagnosis not present

## 2010-11-05 DIAGNOSIS — R0789 Other chest pain: Secondary | ICD-10-CM | POA: Diagnosis present

## 2010-11-05 DIAGNOSIS — Z982 Presence of cerebrospinal fluid drainage device: Secondary | ICD-10-CM

## 2010-11-05 DIAGNOSIS — R51 Headache: Secondary | ICD-10-CM | POA: Diagnosis present

## 2010-11-05 DIAGNOSIS — I10 Essential (primary) hypertension: Secondary | ICD-10-CM | POA: Diagnosis not present

## 2010-11-05 LAB — POCT CARDIAC MARKERS: Troponin i, poc: <0.05 ng/mL (ref 0.00–0.09)

## 2010-11-05 LAB — CBC
HCT: 44.5 % (ref 39.0–52.0)
Hemoglobin: 15.1 g/dL (ref 13.0–17.0)
MCH: 30.6 pg (ref 26.0–34.0)
MCV: 90.1 fL (ref 78.0–100.0)
RBC: 4.94 MIL/uL (ref 4.22–5.81)

## 2010-11-05 LAB — GLUCOSE, CAPILLARY: Glucose-Capillary: 220 mg/dL — ABNORMAL HIGH (ref 70–99)

## 2010-11-05 LAB — BASIC METABOLIC PANEL
CO2: 27 mEq/L (ref 19–32)
Chloride: 104 mEq/L (ref 96–112)
GFR calc Af Amer: >60 mL/min (ref >60)
Potassium: 2.9 mEq/L — ABNORMAL LOW (ref 3.5–5.1)

## 2010-11-05 LAB — DIFFERENTIAL
Lymphocytes Relative: 27 % (ref 12–46)
Lymphs Abs: 2.7 10*3/uL (ref 0.7–4.0)
Monocytes Relative: 9 % (ref 3–12)
Neutro Abs: 6.2 10*3/uL (ref 1.7–7.7)
Neutrophils Relative %: 62 % (ref 43–77)

## 2010-11-05 LAB — CARDIAC PANEL(CRET KIN+CKTOT+MB+TROPI)
CK, MB: 2.9 ng/mL (ref 0.3–4.0)
Relative Index: 2.1 (ref 0.0–2.5)
Total CK: 139 U/L (ref 7–232)
Troponin I: 0.01 ng/mL (ref 0.00–0.06)

## 2010-11-05 LAB — MAGNESIUM: Magnesium: 2.3 mg/dL (ref 1.5–2.5)

## 2010-11-06 LAB — COMPREHENSIVE METABOLIC PANEL
ALT: 13 U/L (ref 0–53)
Alkaline Phosphatase: 55 U/L (ref 39–117)
CO2: 27 mEq/L (ref 19–32)
Calcium: 9.7 mg/dL (ref 8.4–10.5)
GFR calc non Af Amer: >60 mL/min (ref >60)
Glucose, Bld: 165 mg/dL — ABNORMAL HIGH (ref 70–99)
Potassium: 3.9 mEq/L (ref 3.5–5.1)
Sodium: 142 mEq/L (ref 135–145)
Total Bilirubin: 1.1 mg/dL (ref 0.3–1.2)

## 2010-11-06 LAB — GLUCOSE, CAPILLARY: Glucose-Capillary: 126 mg/dL — ABNORMAL HIGH (ref 70–99)

## 2010-11-06 LAB — CARDIAC PANEL(CRET KIN+CKTOT+MB+TROPI): CK, MB: 2.7 ng/mL (ref 0.3–4.0)

## 2010-11-06 LAB — MAGNESIUM: Magnesium: 2.2 mg/dL (ref 1.5–2.5)

## 2010-11-06 LAB — HEMOGLOBIN A1C: Mean Plasma Glucose: 108 mg/dL (ref <117)

## 2010-11-06 LAB — DIFFERENTIAL
Basophils Absolute: 0 10*3/uL (ref 0.0–0.1)
Lymphocytes Relative: 6 % — ABNORMAL LOW (ref 12–46)
Monocytes Absolute: 0.5 10*3/uL (ref 0.1–1.0)
Neutro Abs: 17.7 10*3/uL — ABNORMAL HIGH (ref 1.7–7.7)

## 2010-11-06 LAB — CBC
HCT: 42.4 % (ref 39.0–52.0)
Hemoglobin: 14.3 g/dL (ref 13.0–17.0)
MCHC: 33.7 g/dL (ref 30.0–36.0)

## 2010-11-06 LAB — TSH: TSH: 0.636 u[IU]/mL (ref 0.350–4.500)

## 2010-11-07 LAB — DIFFERENTIAL
Lymphs Abs: 1.8 10*3/uL (ref 0.7–4.0)
Monocytes Relative: 7 % (ref 3–12)
Neutro Abs: 19.7 10*3/uL — ABNORMAL HIGH (ref 1.7–7.7)
Neutrophils Relative %: 85 % — ABNORMAL HIGH (ref 43–77)

## 2010-11-07 LAB — BASIC METABOLIC PANEL
BUN: 14 mg/dL (ref 6–23)
CO2: 29 mEq/L (ref 19–32)
Chloride: 105 mEq/L (ref 96–112)
Creatinine, Ser: 1.08 mg/dL (ref 0.4–1.5)

## 2010-11-07 LAB — CBC
Hemoglobin: 12.9 g/dL — ABNORMAL LOW (ref 13.0–17.0)
MCH: 31.6 pg (ref 26.0–34.0)
MCV: 97.3 fL (ref 78.0–100.0)
RBC: 4.08 MIL/uL — ABNORMAL LOW (ref 4.22–5.81)

## 2010-11-09 NOTE — Consult Note (Signed)
Summary: [facility] Consultation Report  [facility] Consultation Report   Imported By: Earl Many 11/01/2010 16:58:05  _____________________________________________________________________  External Attachment:    Type:   Image     Comment:   External Document

## 2010-11-10 NOTE — Discharge Summary (Signed)
[name], [name]                ACCOUNT NO.:  [id number]  MEDICAL RECORD NO.:  [id number]           PATIENT TYPE:  I  LOCATION:  A308                          FACILITY:  [facility]  PHYSICIAN:  Elliot Cousin, M.D.    DATE OF BIRTH:  01-Jun-1956  DATE OF ADMISSION:  11/05/2010 DATE OF DISCHARGE:  02/14/2012LH                              DISCHARGE SUMMARY   DISCHARGE DIAGNOSES: 1. Asthma with acute exacerbation. 2. Malignant hypertension. 3. Hypokalemia. 4. Type 2 diabetes mellitus. 5. Noncardiac chest pain. 6. Leukocytosis secondary to steroid therapy. 7. Chronic headaches.  DISCHARGE MEDICATIONS: 1. Potassium chloride 20 mEq daily. 2. Levaquin 500 mg daily for 5 more days. 3. Prednisone taper with 10 mg tablets, to be tapered     as directed over the next 5 days. 4. Lisinopril.  (The dose was increased from 10 mg daily to 15 mg     daily.  The patient was instructed to take 1-1/2 tablets daily). 5. Advair Diskus 500/50 one puff b.i.d. 6. Albuterol inhaler 1-2 puffs every 6 hours as needed for wheezing     and shortness of breath. 7. Albuterol nebulization one nebulization every 4 hours as needed for     shortness of breath. 8. Amlodipine 10 mg daily. 9. Aspirin 81 mg daily. 10.Depakote 500 mg b.i.d. 11.Glyburide 5 mg daily. 12.Hydrocodone/APAP 7.5/325 mg twice daily as needed for headaches. 13.Spiriva 18 mcg 1 inhalation daily. 14.Topamax 50 mg three times daily as needed for headaches.  DISCHARGE DISPOSITION:  The patient was discharged to home in improved and stable condition on November 07, 2010.  He will follow up with his neurologist in [place] in 3 weeks and with his primary care physician Dr. Lysbeth Galas in 2-1/2 weeks.  CONSULTATIONS:  None.  PROCEDURES PERFORMED:  In the emergency department, the patient's chest x-ray revealed no acute disease.  HISTORY OF PRESENT ILLNESS:  The patient is a 55 year old man with a past medical history significant for  noncardiac chest pain, type 2 diabetes mellitus, hypertension, and asthma.  He presented to the emergency department on November 05, 2010, with a chief complaint of shortness of breath and wheezing.  When he was initially evaluated, he was noted to be hemodynamically stable and afebrile.  His EKG revealed normal sinus rhythm with a heart rate of 87 beats per minute but no ST or T-wave abnormalities.  His serum potassium was low at 2.9.  His chest x-ray revealed no acute disease.  He was admitted for further evaluation and management.  HOSPITAL COURSE: 1. ASTHMA WITH ACUTE BRONCHITIC EXACERBATION.  The patient was given a     total of 250 mg of Solu-Medrol by emergency department physician,     Dr. Rosalia Hammers.  He was also treated with a number of albuterol     nebulizations in the emergency department.  Following admission,     he was started on treatment with albuterol and Atrovent nebulizers     every 4 hours and then every 2 hours as needed, intravenous Solu-     Medrol 80 mg IV q.8 h. and empiric antibiotic coverage with  Levaquin 500 mg daily.  He was also given 1 g of magnesium sulfate.     He was maintained on Advair.  Spiriva was withheld due to Atrovent     nebulizations.  Over the course of the hospitalization, the     patient's wheezing and chest congestion quickly abated.  It     subsequently resolved completely prior to discharge.  Solu-Medrol     was discontinued in favor of prednisone.  The nebulizations were     discontinued in favor of albuterol HFA inhaler and Spiriva.  He was     discharged to home on a prednisone taper, Levaquin, and     bronchodilator therapy.  The patient was strongly advised to use     Advair twice daily on a more consistent basis.  He voiced     understanding. 2. HYPERTENSION.  The patient has a history of hypertension treated     with both lisinopril and amlodipine.  During the hospitalization,     his blood pressures became more elevated, ranging  in the 160s to     170s systolically.  Therefore, the dose of lisinopril was increased     from 10 mg to 15 mg daily.  In part, his elevated blood pressures     were likely the consequence of high-dose steroid therapy.  Further     management will be deferred to Dr. Lysbeth Galas. 3. HYPOKALEMIA.  The patient's serum potassium was 2.9 on admission.     His magnesium level was within normal limits.  He was repleted with     potassium chloride.  Prior to discharge, his serum potassium was     4.0. 4. TYPE 2 DIABETES MELLITUS.  The patient was maintained on glyburide.     His capillary blood glucose was monitored before each meal and at     bedtime.  His capillary blood glucose did become elevated on     steroid therapy.  He was treated with insulin accordingly.  His     hemoglobin A1c was noted to be 5.4. 5. LEUKOCYTOSIS.  The patient's white blood cell count increased to     23.1.  The leukocytosis was secondary to high-dose steroid therapy.     He remained afebrile.  There was no evidence of pneumonia on the     chest x-ray, although he was treated empirically with Levaquin. 6. CHRONIC HEADACHES AND HISTORY OF VP SHUNT.  The patient had no     neurological deficits during the hospitalization.  He was treated     appropriately with Depakote and analgesics.  He did admit being     noncompliant with Depakote.  His valproic acid level was less than     10.  He was given a prescription for Depakote prior to discharge     and encouraged to be more compliant.  He indicated that the     Depakote was not only for headaches but for prophylaxis against     seizures. 7. CHEST PAIN.  The patient complained of chest pain which was likely     secondary to chest wall pain from coughing and from asthma     exacerbation.  All of his cardiac enzymes were within normal     limits.  His TSH was     within normal limits at 0.6 and his free T4 was within normal     limits at 0.92. 8. MORBID OBESITY.  The  registered dietitian was consulted.  She  instructed the patient on weight loss strategies and changes in his     meal planning for healthier eating and for weight loss.  The     patient was receptive.     Elliot Cousin, M.D.     DF/MEDQ  D:  11/07/2010  T:  11/08/2010  Job:  [id number]  cc:   Delaney Meigs, M.D. Fax: [phone]  Electronically Signed by Elliot Cousin M.D. on 11/10/2010 09:50:32 AM

## 2010-11-10 NOTE — H&P (Signed)
[name], [name]                ACCOUNT NO.:  [id number]  MEDICAL RECORD NO.:  [id number]           PATIENT TYPE:  I  LOCATION:  A308                          FACILITY:  [facility]  PHYSICIAN:  Elliot Cousin, M.D.    DATE OF BIRTH:  December 21, 1955  DATE OF ADMISSION:  11/05/2010 DATE OF DISCHARGE:  LH                             HISTORY & PHYSICAL   The patient's primary care physician is Dr. Joette Catching.  CHIEF COMPLAINT:  Shortness of breath and wheezing.  HISTORY OF PRESENT ILLNESS:  The patient is a 55 year old man with a past medical history significant for asthma, VP shunt, chronic headaches, and type 2 diabetes mellitus.  He presents to the emergency department today with a chief complaint of shortness of breath and wheezing.  His symptoms started approximately 2-3 days ago.  He actually presented to the emergency department on November 02, 2010 with the same symptoms.  The transcribed report from the emergency department is not available currently.  The patient states that he was given breathing treatments and blood work was performed.  He was sent home after feeling better.  Over the past 48 hours, his wheezing and shortness of breath have worsened.  He has central chest pain which he believes is secondary to the wheezing and coughing.  His cough has been nonproductive.  He has had no pleurisy.  His shortness of breath is primarily with ambulation but not necessarily when laying flat or at rest.  He has had subjective fever and chills.  He has had several loose stools over the past 24 hours.  He has had nausea but no vomiting.  He has chronic headaches, not worse than usual.  Over the past 24 hours, he has used his albuterol nebulizer at least 6 times.  He uses his Spiriva daily.  He admits that he does not use Advair but 2-3 times weekly.  In the emergency department, the patient is noted to be afebrile and hemodynamically stable.  His EKG reveals normal sinus rhythm with  a heart rate of 87 beats per minute and no ST or T-wave abnormalities. His CBC is within normal limits.  His potassium is low at 2.9.  His chest x-ray reveals no acute disease.  He is being admitted for further evaluation and management.  PAST MEDICAL HISTORY: 1. Recent hospitalization in November to December 2011 for chest pain.     The patient ruled out for myocardial infarction.  The cardiac     stress Myoview test revealed no clearly diagnostic ST-segment     changes, no chest pain reported, moderate sized fixed inferior     perfusion defect most consistent with soft tissue attenuation, no     clear evidence of scar or ischemia, and ejection fraction of 49%     which appeared to be underestimated.  His 2-D echocardiogram on     August 22, 2010 revealed an ejection fraction of 65% and grade 1     diastolic dysfunction. 2. Asthma with recent frequent exacerbations. 3. History of VP shunt in 2005, presumably for hydrocephalus.  The VP     shunt  had to be replaced twice, once in 2005, and another time in     2008. 4. Chronic headaches. 5. Type 2 diabetes mellitus. 6. Hypertension. 7. Morbid obesity. 8. Status post circumcision in March 2007 for phimosis. 9. History of surgical jaw repair several years ago secondary to     trauma.  MEDICATIONS: 1. Aspirin 81 mg daily. 2. Advair Diskus 500 mg 1 puff twice daily. 3. Albuterol inhaler 1-2 puffs every 4-6 hours as needed for shortness     of breath and wheezing. 4. Albuterol nebulization as needed but lately 5-6 times daily. 5. Amlodipine 10 mg daily. 6. Depakote 500 mg twice daily. 7. Glyburide 5 mg daily. 8. Hydrocodone/APAP 5/500 mg 1 tablet every 4 hours as needed for     pain. 9. Lisinopril 10 mg daily. 10.Spiriva 18 mcg daily.  ALLERGIES:  The patient has an allergy to MORPHINE.  SOCIAL HISTORY:  The patient is married.  [place]  He [place] disability benefits.  His wife is  somewhat disabled and she is currently in a rest home.  He has 2 children.  He denies tobacco, alcohol, and illicit drug use.  FAMILY HISTORY:  His mother has hypertension and degenerative joint disease.  She is 37 years of age.  His father died of emphysema at 19 years of age.  REVIEW OF SYSTEMS:  The patient's review of systems is positive for increase in stress and anxiety at home due to the illness of his wife.  PHYSICAL EXAMINATION:  VITAL SIGNS:  Temperature 98.6, blood pressure 144/75, pulse 96, respiratory rate 17, oxygen saturation 93% on room air. GENERAL:  The patient is a pleasant 55 year old obese African American man who is currently lying in bed in no acute distress. HEENT:  Head is normocephalic nontraumatic.  Pupils are equal, round, and reactive to light.  Extraocular muscles are intact.  Conjunctivae are clear.  Sclerae are white.  Tympanic membranes are clear bilaterally.  Nasal mucosa is dry with no sinus tenderness.  Oropharynx reveals mildly dry mucous membranes.  No posterior exudates or erythema. NECK:  Supple.  No adenopathy, no thyromegaly, no bruit, no JVD. LUNGS:  Mild diffuse wheezing throughout all lung fields.  Breath sounds are audible down to the bases.  Breathing is currently nonlabored. HEART:  S1, S2 with borderline tachycardia. ABDOMEN:  Obese positive bowel sounds, soft, nontender, nondistended. No hepatosplenomegaly, no masses palpated. GU and RECTAL:  Deferred. EXTREMITIES:  Pedal pulses are palpable bilaterally.  No pretibial edema and no pedal edema. NEUROLOGIC:  The patient is alert and oriented x3.  Cranial nerves II- XII are intact.  Strength is 5/5 throughout.  Sensation is intact. PSYCHOLOGICAL:  The patient is alert and oriented x3.  His speech is clear.  He is cooperative.  He has somewhat of a sad affect.  ADMISSION LABORATORIES:  Chest x-ray reveals no acute disease.  EKG reveals normal sinus rhythm with a heart rate of 87 beats  per minute with no acute abnormalities.  WBC 9.9, hemoglobin 15.1, platelet count 332,000.  Sodium 140, potassium 2.9, chloride 104, CO2 27, glucose 132, BUN 9, creatinine 1.18, calcium 9.2, CK-MB 1.6, troponin I less than 0.05, myoglobin 118.  ASSESSMENT: 1. Asthma with acute exacerbation. 2. Hypokalemia with a history of hypokalemia.  The patient's serum     potassium is currently 2.9. 3. Type 2 diabetes mellitus.  The patient's hemoglobin A1c was 5.0 in     November 2011. 4. Chest pain.  It is likely that the patient's chest pain is the     consequence of asthma with exacerbation.  His EKG revealed no     worrisome changes. 5. Hypertension.  The patient does have a history of malignant     hypertension.  It is currently modestly elevated. 6. Status post VP shunt with residual chronic headaches.  PLAN: 1. The patient was given a total of 250 mg of Solu-Medrol and multiple     albuterol nebulizations in the emergency department as ordered by     Dr. Rosalia Hammers. 2. We will continue treatment with Solu-Medrol, albuterol and     Atrovent nebulizations, Advair, and Levaquin. 3. We will give him 1 empiric dose of magnesium sulfate. 4. We will continue Advair and hold Spiriva while he is receiving     Atrovent nebulizations. 5. We will check cardiac enzymes q.6 h. x3.  We will also order a     magnesium level to rule out deficiency. 6. We will replete his potassium chloride orally and in the IV fluids. 7. We will add sliding scale NovoLog and Lantus to glyburide in     anticipation that his capillary blood glucose will increase on     steroid therapy. 8. We will check a valproic acid level. 9. We will consult the nutritionist/registered dietitian to assist     with weight loss management.     Elliot Cousin, M.D.     DF/MEDQ  D:  11/05/2010  T:  11/05/2010  Job:  [id number]  cc:   Delaney Meigs, M.D. Fax: [phone]  Electronically Signed by Elliot Cousin M.D. on 11/10/2010  09:51:59 AM

## 2010-11-12 ENCOUNTER — Emergency Department (HOSPITAL_COMMUNITY): Payer: Medicaid Other

## 2010-11-12 ENCOUNTER — Emergency Department (HOSPITAL_COMMUNITY)
Admission: EM | Admit: 2010-11-12 | Discharge: 2010-11-12 | Disposition: A | Discharge status: Home / Self Care | Payer: Medicaid Other | Attending: Emergency Medicine | Admitting: Emergency Medicine

## 2010-11-12 DIAGNOSIS — R0609 Other forms of dyspnea: Secondary | ICD-10-CM | POA: Insufficient documentation

## 2010-11-12 DIAGNOSIS — R0989 Other specified symptoms and signs involving the circulatory and respiratory systems: Secondary | ICD-10-CM | POA: Insufficient documentation

## 2010-11-12 DIAGNOSIS — I10 Essential (primary) hypertension: Secondary | ICD-10-CM | POA: Insufficient documentation

## 2010-11-12 DIAGNOSIS — R05 Cough: Secondary | ICD-10-CM | POA: Insufficient documentation

## 2010-11-12 DIAGNOSIS — J45909 Unspecified asthma, uncomplicated: Secondary | ICD-10-CM | POA: Insufficient documentation

## 2010-11-12 DIAGNOSIS — R071 Chest pain on breathing: Secondary | ICD-10-CM | POA: Insufficient documentation

## 2010-11-12 DIAGNOSIS — R059 Cough, unspecified: Secondary | ICD-10-CM | POA: Insufficient documentation

## 2010-11-12 DIAGNOSIS — E876 Hypokalemia: Secondary | ICD-10-CM | POA: Insufficient documentation

## 2010-11-12 LAB — DIFFERENTIAL
Eosinophils Absolute: 0.1 10*3/uL (ref 0.0–0.7)
Eosinophils Relative: 1 % (ref 0–5)
Lymphs Abs: 2.1 10*3/uL (ref 0.7–4.0)
Monocytes Relative: 8 % (ref 3–12)

## 2010-11-12 LAB — BASIC METABOLIC PANEL
BUN: 7 mg/dL (ref 6–23)
Chloride: 102 mEq/L (ref 96–112)
Creatinine, Ser: 1.13 mg/dL (ref 0.4–1.5)
Glucose, Bld: 98 mg/dL (ref 70–99)

## 2010-11-12 LAB — CBC
MCH: 31.1 pg (ref 26.0–34.0)
MCV: 94.8 fL (ref 78.0–100.0)
Platelets: 261 10*3/uL (ref 150–400)
RBC: 4.6 MIL/uL (ref 4.22–5.81)

## 2010-11-20 ENCOUNTER — Encounter (HOSPITAL_COMMUNITY): Payer: Self-pay | Admitting: Radiology

## 2010-11-20 ENCOUNTER — Emergency Department (HOSPITAL_COMMUNITY): Payer: Medicaid Other

## 2010-11-20 ENCOUNTER — Observation Stay (HOSPITAL_COMMUNITY)
Admission: EM | Admit: 2010-11-20 | Discharge: 2010-11-21 | Disposition: A | Discharge status: Home / Self Care | Payer: Medicaid Other | Attending: Family Medicine | Admitting: Family Medicine

## 2010-11-20 DIAGNOSIS — J45901 Unspecified asthma with (acute) exacerbation: Principal | ICD-10-CM | POA: Insufficient documentation

## 2010-11-20 DIAGNOSIS — Z79899 Other long term (current) drug therapy: Secondary | ICD-10-CM | POA: Insufficient documentation

## 2010-11-20 DIAGNOSIS — E119 Type 2 diabetes mellitus without complications: Secondary | ICD-10-CM | POA: Insufficient documentation

## 2010-11-20 DIAGNOSIS — I10 Essential (primary) hypertension: Secondary | ICD-10-CM | POA: Insufficient documentation

## 2010-11-20 DIAGNOSIS — G894 Chronic pain syndrome: Secondary | ICD-10-CM | POA: Insufficient documentation

## 2010-11-20 HISTORY — DX: Essential (primary) hypertension: I10

## 2010-11-20 LAB — BLOOD GAS, ARTERIAL
Acid-Base Excess: 5.8 mmol/L — ABNORMAL HIGH (ref 0.0–2.0)
Bicarbonate: 30 mEq/L — ABNORMAL HIGH (ref 20.0–24.0)
FIO2: 0.21 %
O2 Saturation: 99.5 %
Patient temperature: 37
TCO2: 26 mmol/L (ref 0–100)

## 2010-11-20 LAB — CBC
HCT: 43.5 % (ref 39.0–52.0)
Hemoglobin: 14.2 g/dL (ref 13.0–17.0)
MCH: 31.1 pg (ref 26.0–34.0)
MCHC: 32.6 g/dL (ref 30.0–36.0)
MCV: 95.2 fL (ref 78.0–100.0)
RDW: 12.9 % (ref 11.5–15.5)

## 2010-11-20 LAB — BASIC METABOLIC PANEL
BUN: 7 mg/dL (ref 6–23)
CO2: 30 mEq/L (ref 19–32)
Calcium: 8.9 mg/dL (ref 8.4–10.5)
GFR calc non Af Amer: >60 mL/min (ref >60)
Glucose, Bld: 100 mg/dL — ABNORMAL HIGH (ref 70–99)

## 2010-11-20 LAB — CARDIAC PANEL(CRET KIN+CKTOT+MB+TROPI)
CK, MB: 2.3 ng/mL (ref 0.3–4.0)
Relative Index: INVALID (ref 0.0–2.5)
Total CK: 91 U/L (ref 7–232)

## 2010-11-20 LAB — DIFFERENTIAL
Eosinophils Relative: 1 % (ref 0–5)
Lymphocytes Relative: 29 % (ref 12–46)
Lymphs Abs: 2.2 10*3/uL (ref 0.7–4.0)
Monocytes Absolute: 0.5 10*3/uL (ref 0.1–1.0)
Monocytes Relative: 6 % (ref 3–12)

## 2010-11-21 LAB — CARDIAC PANEL(CRET KIN+CKTOT+MB+TROPI)
Relative Index: INVALID (ref 0.0–2.5)
Total CK: 90 U/L (ref 7–232)

## 2010-11-21 LAB — CBC
HCT: 40.8 % (ref 39.0–52.0)
MCH: 31.2 pg (ref 26.0–34.0)
MCHC: 32.4 g/dL (ref 30.0–36.0)
MCV: 96.5 fL (ref 78.0–100.0)
Platelets: 261 10*3/uL (ref 150–400)
RDW: 13.2 % (ref 11.5–15.5)
WBC: 22.3 10*3/uL — ABNORMAL HIGH (ref 4.0–10.5)

## 2010-11-21 LAB — BASIC METABOLIC PANEL
BUN: 11 mg/dL (ref 6–23)
CO2: 28 mEq/L (ref 19–32)
GFR calc non Af Amer: >60 mL/min (ref >60)
Glucose, Bld: 177 mg/dL — ABNORMAL HIGH (ref 70–99)
Potassium: 3.4 mEq/L — ABNORMAL LOW (ref 3.5–5.1)

## 2010-11-21 LAB — DIFFERENTIAL
Eosinophils Absolute: 0 10*3/uL (ref 0.0–0.7)
Eosinophils Relative: 0 % (ref 0–5)
Lymphocytes Relative: 4 % — ABNORMAL LOW (ref 12–46)
Lymphs Abs: 0.8 10*3/uL (ref 0.7–4.0)
Monocytes Absolute: 0.5 10*3/uL (ref 0.1–1.0)

## 2010-11-21 NOTE — H&P (Signed)
[name], [name]                ACCOUNT NO.:  [id number]  MEDICAL RECORD NO.:  [id number]           PATIENT TYPE:  I  LOCATION:  A301                          FACILITY:  [facility]  PHYSICIAN:  Tarry Kos, MD       DATE OF BIRTH:  03-15-1956  DATE OF ADMISSION:  11/20/2010 DATE OF DISCHARGE:  LH                             HISTORY & PHYSICAL   CHIEF COMPLAINT:  Wheezing.  PRIMARY CARE PHYSICIAN:  Dr. Arlyce Dice.  HISTORY OF PRESENT ILLNESS:  [name] is a 55 year old male with a longstanding history of asthma who presents to the emergency room after several days of wheezing.  He was actually discharged on November 08, 2010, with an asthma exacerbation, was placed on steroid taper that lasted only 5 days.  [name] says that as long as he was on steroids he did well and then within about 2-3 days of being off the steroids he started having more wheezing.  He came to the emergency department.  He got several breathing treatments in the ED and his wheezing has almost completely resolved.  He denies running any fevers.  He does not smoke, but his wife smokes and his wife smokes in the house and he is around tobacco smoke daily.  He is currently in the hospital bed.  He is improved clinically, however, he says he is still wheezing and is requesting IV Dilaudid.  He takes hydrocodone chronically at home.  REVIEW OF SYSTEMS:  Otherwise negative.  PAST MEDICAL HISTORY: 1. Malignant hypertension. 2. Type 2 diabetes. 3. Noncardiac chest pain. 4. Chronic headaches. 5. Chronic narcotic dependence. 6. Asthma. 7. In December 2011, stress Myoview test showed EF of 49%, moderate-     sized fixed inferior perfusion defect most consistent with soft     tissue attenuation, no ischemia. 8. Frequent ED visits for his abdomen. 9. 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 10.Status post circumcision in March 2007 for phimosis. 11.History of  surgical jaw repair secondary to trauma in the past.  MEDICATIONS:  Per  his discharge summary from November 08, 2010: 1. He is supposed to be taking KCl 20 mEq a day. 2. He just finished Levaquin 500 mg daily 5 days. 3. He just also finished prednisone taper with 10 mg tablets to be     tapered as directed over 5-day period from that time. 4. Lisinopril 15 mg daily. 5. Advair Diskus 500/50 one puff twice a day. 6. Albuterol inhaler 1-2 puffs every 6 as needed for wheezing or     shortness of breath. 7. Albuterol nebulizer 1 neb every 4 hours as needed for shortness of     breath. 8. Amlodipine 10 mg daily. 9. Aspirin 81 mg daily. 10.Depakote 500 mg twice a day. 11.Glyburide 5 mg daily/ 12.Hydrocodone/APAP 7.5/325 mg twice a day as needed for headaches. 13.Spiriva 18 mcg 1 inhaled daily. 14.Topamax 50 mg 3 times daily as needed for headaches.  ALLERGIES:  Allergic to MORPHINE.  SOCIAL HISTORY:  He is married.  He does not smoke  again, but his wife does.  He has 2 children.  He does not do any alcohol or any other drugs.  FAMILY HISTORY:  Noncontributory.  PHYSICAL EXAMINATION:  VITAL SIGNS:  Temperature is 98.1, pulse 99, respirations 22, blood pressure 173/101, and 94% O2 sats on room air. GENERAL:  He is alert and oriented, in no apparent distress, can speak in full sentences without any difficulty. HEENT:  Extraocular muscles are intact.  Pupils are equal and reactive to light.  Oropharynx is clear.  Mucous membranes are moist. NECK:  No JVD.  No carotid bruits. HEART:  Regular rate and rhythm without murmurs or gallops. CHEST:  Clear to auscultation bilaterally.  There are no wheezes, rhonchi, or rales.  When the patient breathes outward, he makes good efforts to make upper airway noise and he is trying to make himself sound like he is wheezing. ABDOMEN:  Soft and nontender.  Positive bowel sounds.  No hepatosplenomegaly.  He has got a reducible stable ventral  hernia. EXTREMITIES:  No clubbing, cyanosis, or edema. PSYCH:  Normal affect. NEURO:  No focal neurologic deficits. SKIN:  No rashes.  LABORATORY DATA:  Chest x-ray is negative.  BMP is normal with a potassium of 3.  White count is normal.  Hemoglobin is normal.  ABG: PCO2 is 44.8, pO2 is 169 and this was on room air with a pH of 7.44.  ASSESSMENT/PLAN:  This is a 55 year old male with asthma exacerbation. 1. Asthma exacerbation.  We will place him on prednisone 30 mg twice a     day and do a longer taper.  He was wheezing in the ED, however, he     has received about 3 breathing treatments and his wheezing has     resolved.  We will place him on oral steroids and observe him     overnight and probably discharge him tomorrow. 2. Chronic pain syndrome, highly suspecting drug-seeking behavior.  He     is only requesting IV Dilaudid at this     time.  I am not going to give him any narcotics.  He accepts he     chronically takes it at home. 3. Clarify home medications and resume those.  We will observe     overnight.  Further recommendations depending on hospital course.                                           ______________________________ Tarry Kos, MD     RD/MEDQ  D:  11/20/2010  T:  11/21/2010  Job:  [id number]  Electronically Signed by Eldridge Dace MD on 11/21/2010 11:04:41 AM

## 2010-11-24 NOTE — Discharge Summary (Signed)
  [name], [name]                ACCOUNT NO.:  [id number]  MEDICAL RECORD NO.:  [id number]           PATIENT TYPE:  LOCATION:                                 FACILITY:  PHYSICIAN:  Tarry Kos, MD       DATE OF BIRTH:  [birth year]  DATE OF ADMISSION: DATE OF DISCHARGE:  LH                              DISCHARGE SUMMARY   DISCHARGE DIAGNOSES: 1. Acute asthma exacerbation. 2. Hypertension. 3. Chronic pain syndrome. 4. Possibly anxiety.  SUMMARY OF HOSPITAL COURSE:  [name] is a 55 year old male presented to the emergency room with an asthma exacerbation.  He had a quick turnaround and had much improvement with approximately 2-3 nebulizer treatments in the ED and was observed overnight.  He never required any oxygen, he was afebrile.  PHYSICAL EXAMINATION:  VITAL SIGNS:  Stable. GENERAL:  He has been alert and oriented in no apparent stress, cooperative and friendly. COR:  Regular rate and rhythm without murmur, rubs, or gallops. CHEST:  Clear to auscultation bilaterally.  No wheezing, rhonchi, or rales. ABDOMEN:  Soft, nontender, nondistended, positive bowel sounds.  No hepatosplenomegaly. EXTREMITIES:  No clubbing, cyanosis, or edema. PSYCHIATRIC:  Normal affect. NEUROLOGICAL:  No focal neurologic deficits. SKIN:  No rashes.  He had a chest x-ray which did not show any infiltrate.  He was placed on prednisone 30 mg twice a day.  He also has ran out of his pain medications approximately a month ago and had multiple request for IV Dilaudid, drug seeking behavior was suspected, and indeed he has ran out of his chronic pain meds, he takes about 4 hydrocodone 7.5 mg daily for chronic headaches and noncardiac chest pain.  He gets his pain medications from his neurologist and he has missed his recent appointment because his wife is going to treatment for cancer.  For this reason, I am going to give him 30 pills of the hydrocodone, and he needs to follow up with the  physician who chronically gives him his pain meds for his followup appointment in March.  He is being discharged home.  Follow up with primary care physician in 1 week for on a long prednisone taper and again I have given him 30 of his hydrocodone pills.  He is to continue his home medication regimen otherwise.                                           ______________________________ Tarry Kos, MD     RD/MEDQ  D:  11/21/2010  T:  11/22/2010  Job:  [id number]  Electronically Signed by Eldridge Dace MD on 11/24/2010 02:10:53 PM

## 2010-11-30 ENCOUNTER — Emergency Department (HOSPITAL_COMMUNITY)
Admission: EM | Admit: 2010-11-30 | Discharge: 2010-11-30 | Disposition: A | Discharge status: Home / Self Care | Payer: Medicaid Other | Attending: Emergency Medicine | Admitting: Emergency Medicine

## 2010-11-30 ENCOUNTER — Emergency Department (HOSPITAL_COMMUNITY): Payer: Medicaid Other

## 2010-11-30 DIAGNOSIS — J069 Acute upper respiratory infection, unspecified: Secondary | ICD-10-CM | POA: Insufficient documentation

## 2010-11-30 DIAGNOSIS — E119 Type 2 diabetes mellitus without complications: Secondary | ICD-10-CM | POA: Insufficient documentation

## 2010-11-30 DIAGNOSIS — J45909 Unspecified asthma, uncomplicated: Secondary | ICD-10-CM | POA: Insufficient documentation

## 2010-11-30 DIAGNOSIS — I10 Essential (primary) hypertension: Secondary | ICD-10-CM | POA: Insufficient documentation

## 2010-12-04 ENCOUNTER — Emergency Department (HOSPITAL_COMMUNITY)
Admission: EM | Admit: 2010-12-04 | Discharge: 2010-12-05 | Disposition: A | Discharge status: Other Acute Inpatient Hospital | Payer: Medicaid Other | Attending: Emergency Medicine | Admitting: Emergency Medicine

## 2010-12-04 ENCOUNTER — Emergency Department (HOSPITAL_COMMUNITY): Payer: Medicaid Other

## 2010-12-04 DIAGNOSIS — R45850 Homicidal ideations: Secondary | ICD-10-CM | POA: Insufficient documentation

## 2010-12-04 DIAGNOSIS — F1420 Cocaine dependence, uncomplicated: Secondary | ICD-10-CM | POA: Insufficient documentation

## 2010-12-04 LAB — CBC
Hemoglobin: 14 g/dL (ref 13.0–17.0)
MCH: 31 pg (ref 26.0–34.0)
MCV: 93 fL (ref 78.0–100.0)
Platelets: 211 10*3/uL (ref 150–400)
Platelets: 307 10*3/uL (ref 150–400)
RBC: 4.32 MIL/uL (ref 4.22–5.81)
RBC: 4.41 MIL/uL (ref 4.22–5.81)
WBC: 6.4 10*3/uL (ref 4.0–10.5)
WBC: 7.1 10*3/uL (ref 4.0–10.5)

## 2010-12-04 LAB — COMPREHENSIVE METABOLIC PANEL
Alkaline Phosphatase: 55 U/L (ref 39–117)
BUN: 9 mg/dL (ref 6–23)
Chloride: 103 mEq/L (ref 96–112)
Creatinine, Ser: 1.33 mg/dL (ref 0.4–1.5)
GFR calc non Af Amer: 56 mL/min — ABNORMAL LOW (ref >60)
Glucose, Bld: 102 mg/dL — ABNORMAL HIGH (ref 70–99)
Potassium: 3.3 mEq/L — ABNORMAL LOW (ref 3.5–5.1)
Total Bilirubin: 0.8 mg/dL (ref 0.3–1.2)

## 2010-12-04 LAB — BASIC METABOLIC PANEL
Chloride: 103 mEq/L (ref 96–112)
Creatinine, Ser: 1.05 mg/dL (ref 0.4–1.5)
GFR calc Af Amer: >60 mL/min (ref >60)
Potassium: 3 mEq/L — ABNORMAL LOW (ref 3.5–5.1)
Sodium: 142 mEq/L (ref 135–145)

## 2010-12-04 LAB — DIFFERENTIAL
Basophils Absolute: 0 10*3/uL (ref 0.0–0.1)
Basophils Relative: 0 % (ref 0–1)
Eosinophils Absolute: 0.1 10*3/uL (ref 0.0–0.7)
Eosinophils Relative: 3 % (ref 0–5)
Lymphocytes Relative: 28 % (ref 12–46)
Lymphs Abs: 1.8 10*3/uL (ref 0.7–4.0)
Neutro Abs: 5 10*3/uL (ref 1.7–7.7)
Neutrophils Relative %: 62 % (ref 43–77)
Neutrophils Relative %: 70 % (ref 43–77)

## 2010-12-04 LAB — POCT CARDIAC MARKERS
Myoglobin, poc: 59.5 ng/mL (ref 12–200)
Troponin i, poc: <0.05 ng/mL (ref 0.00–0.09)

## 2010-12-04 LAB — CK TOTAL AND CKMB (NOT AT ARMC)
Relative Index: INVALID (ref 0.0–2.5)
Total CK: 98 U/L (ref 7–232)

## 2010-12-04 LAB — ETHANOL: Alcohol, Ethyl (B): <5 mg/dL (ref 0–10)

## 2010-12-04 LAB — TROPONIN I: Troponin I: 0.01 ng/mL (ref 0.00–0.06)

## 2010-12-04 LAB — RAPID URINE DRUG SCREEN, HOSP PERFORMED: Barbiturates: NOT DETECTED

## 2010-12-05 LAB — CBC
HCT: 42.8 % (ref 39.0–52.0)
HCT: 45.7 % (ref 39.0–52.0)
HCT: 45.8 % (ref 39.0–52.0)
Hemoglobin: 14.4 g/dL (ref 13.0–17.0)
Hemoglobin: 16.1 g/dL (ref 13.0–17.0)
MCHC: 32.8 g/dL (ref 30.0–36.0)
MCV: 94.6 fL (ref 78.0–100.0)
RBC: 5 MIL/uL (ref 4.22–5.81)
RDW: 12.7 % (ref 11.5–15.5)
RDW: 13.4 % (ref 11.5–15.5)
WBC: 6.8 10*3/uL (ref 4.0–10.5)
WBC: 7 10*3/uL (ref 4.0–10.5)

## 2010-12-05 LAB — POCT CARDIAC MARKERS
CKMB, poc: 1.2 ng/mL (ref 1.0–8.0)
CKMB, poc: 1.6 ng/mL (ref 1.0–8.0)
CKMB, poc: <1 ng/mL — ABNORMAL LOW (ref 1.0–8.0)
Myoglobin, poc: 96.7 ng/mL (ref 12–200)
Troponin i, poc: <0.05 ng/mL (ref 0.00–0.09)

## 2010-12-05 LAB — BASIC METABOLIC PANEL
BUN: 11 mg/dL (ref 6–23)
CO2: 30 mEq/L (ref 19–32)
Calcium: 8.8 mg/dL (ref 8.4–10.5)
Chloride: 103 mEq/L (ref 96–112)
GFR calc Af Amer: >60 mL/min (ref >60)
GFR calc Af Amer: >60 mL/min (ref >60)
GFR calc Af Amer: >60 mL/min (ref >60)
GFR calc non Af Amer: 55 mL/min — ABNORMAL LOW (ref >60)
GFR calc non Af Amer: >60 mL/min (ref >60)
GFR calc non Af Amer: >60 mL/min (ref >60)
Glucose, Bld: 119 mg/dL — ABNORMAL HIGH (ref 70–99)
Glucose, Bld: 179 mg/dL — ABNORMAL HIGH (ref 70–99)
Potassium: 3 mEq/L — ABNORMAL LOW (ref 3.5–5.1)
Potassium: 3.2 mEq/L — ABNORMAL LOW (ref 3.5–5.1)
Potassium: 3.5 mEq/L (ref 3.5–5.1)
Sodium: 140 mEq/L (ref 135–145)
Sodium: 143 mEq/L (ref 135–145)
Sodium: 143 mEq/L (ref 135–145)

## 2010-12-05 LAB — HEPATIC FUNCTION PANEL
ALT: 16 U/L (ref 0–53)
Bilirubin, Direct: 0.1 mg/dL (ref 0.0–0.3)
Indirect Bilirubin: 1.1 mg/dL — ABNORMAL HIGH (ref 0.3–0.9)
Total Bilirubin: 1.2 mg/dL (ref 0.3–1.2)

## 2010-12-05 LAB — LIPID PANEL
HDL: 34 mg/dL — ABNORMAL LOW (ref >39)
Triglycerides: 171 mg/dL — ABNORMAL HIGH (ref <150)

## 2010-12-05 LAB — DIFFERENTIAL
Basophils Absolute: 0 10*3/uL (ref 0.0–0.1)
Basophils Absolute: 0.1 10*3/uL (ref 0.0–0.1)
Basophils Relative: 1 % (ref 0–1)
Eosinophils Relative: 3 % (ref 0–5)
Lymphocytes Relative: 33 % (ref 12–46)
Lymphocytes Relative: 36 % (ref 12–46)
Lymphocytes Relative: 45 % (ref 12–46)
Lymphs Abs: 2.5 10*3/uL (ref 0.7–4.0)
Monocytes Absolute: 0.5 10*3/uL (ref 0.1–1.0)
Monocytes Absolute: 0.5 10*3/uL (ref 0.1–1.0)
Monocytes Absolute: 0.6 10*3/uL (ref 0.1–1.0)
Monocytes Relative: 7 % (ref 3–12)
Monocytes Relative: 8 % (ref 3–12)
Neutro Abs: 2.9 10*3/uL (ref 1.7–7.7)
Neutro Abs: 3.8 10*3/uL (ref 1.7–7.7)

## 2010-12-05 LAB — CK TOTAL AND CKMB (NOT AT ARMC)
CK, MB: 1.8 ng/mL (ref 0.3–4.0)
Relative Index: 1 (ref 0.0–2.5)

## 2010-12-05 LAB — GLUCOSE, CAPILLARY
Glucose-Capillary: 113 mg/dL — ABNORMAL HIGH (ref 70–99)
Glucose-Capillary: 52 mg/dL — ABNORMAL LOW (ref 70–99)
Glucose-Capillary: 74 mg/dL (ref 70–99)
Glucose-Capillary: 82 mg/dL (ref 70–99)

## 2010-12-05 LAB — HEMOGLOBIN A1C
Hgb A1c MFr Bld: 5 % (ref <5.7)
Mean Plasma Glucose: 97 mg/dL (ref <117)

## 2010-12-05 LAB — CARDIAC PANEL(CRET KIN+CKTOT+MB+TROPI)
CK, MB: 1.8 ng/mL (ref 0.3–4.0)
Relative Index: 1 (ref 0.0–2.5)
Total CK: 181 U/L (ref 7–232)
Troponin I: 0.01 ng/mL (ref 0.00–0.06)
Troponin I: 0.02 ng/mL (ref 0.00–0.06)

## 2010-12-05 LAB — PROTIME-INR
INR: 0.98 (ref 0.00–1.49)
Prothrombin Time: 13.2 seconds (ref 11.6–15.2)

## 2010-12-06 ENCOUNTER — Inpatient Hospital Stay (HOSPITAL_COMMUNITY)
Admission: EM | Admit: 2010-12-06 | Discharge: 2010-12-08 | DRG: 897 | Disposition: A | Discharge status: Home / Self Care | Payer: Medicaid Other | Attending: Psychiatry | Admitting: Psychiatry

## 2010-12-06 DIAGNOSIS — F1910 Other psychoactive substance abuse, uncomplicated: Principal | ICD-10-CM

## 2010-12-06 DIAGNOSIS — I10 Essential (primary) hypertension: Secondary | ICD-10-CM

## 2010-12-06 DIAGNOSIS — G894 Chronic pain syndrome: Secondary | ICD-10-CM

## 2010-12-06 DIAGNOSIS — E119 Type 2 diabetes mellitus without complications: Secondary | ICD-10-CM

## 2010-12-06 DIAGNOSIS — F1994 Other psychoactive substance use, unspecified with psychoactive substance-induced mood disorder: Secondary | ICD-10-CM

## 2010-12-06 DIAGNOSIS — F1420 Cocaine dependence, uncomplicated: Secondary | ICD-10-CM

## 2010-12-06 DIAGNOSIS — E7800 Pure hypercholesterolemia, unspecified: Secondary | ICD-10-CM

## 2010-12-06 DIAGNOSIS — J45909 Unspecified asthma, uncomplicated: Secondary | ICD-10-CM

## 2010-12-06 DIAGNOSIS — F3289 Other specified depressive episodes: Secondary | ICD-10-CM

## 2010-12-06 DIAGNOSIS — F329 Major depressive disorder, single episode, unspecified: Secondary | ICD-10-CM

## 2010-12-06 DIAGNOSIS — E669 Obesity, unspecified: Secondary | ICD-10-CM

## 2010-12-06 LAB — GLUCOSE, CAPILLARY
Glucose-Capillary: 108 mg/dL — ABNORMAL HIGH (ref 70–99)
Glucose-Capillary: 177 mg/dL — ABNORMAL HIGH (ref 70–99)
Glucose-Capillary: 137 mg/dL — ABNORMAL HIGH (ref 70–99)

## 2010-12-07 DIAGNOSIS — F1020 Alcohol dependence, uncomplicated: Secondary | ICD-10-CM

## 2010-12-07 DIAGNOSIS — F1910 Other psychoactive substance abuse, uncomplicated: Secondary | ICD-10-CM

## 2010-12-07 LAB — BASIC METABOLIC PANEL
BUN: 15 mg/dL (ref 6–23)
BUN: 6 mg/dL (ref 6–23)
CO2: 28 mEq/L (ref 19–32)
CO2: 29 mEq/L (ref 19–32)
CO2: 29 mEq/L (ref 19–32)
CO2: 34 mEq/L — ABNORMAL HIGH (ref 19–32)
Calcium: 8.9 mg/dL (ref 8.4–10.5)
Calcium: 9 mg/dL (ref 8.4–10.5)
Calcium: 9.3 mg/dL (ref 8.4–10.5)
Calcium: 9.5 mg/dL (ref 8.4–10.5)
Chloride: 103 mEq/L (ref 96–112)
Chloride: 94 mEq/L — ABNORMAL LOW (ref 96–112)
Creatinine, Ser: 0.92 mg/dL (ref 0.4–1.5)
Creatinine, Ser: 1.04 mg/dL (ref 0.4–1.5)
Creatinine, Ser: 1.08 mg/dL (ref 0.4–1.5)
Creatinine, Ser: 1.1 mg/dL (ref 0.4–1.5)
GFR calc Af Amer: >60 mL/min (ref >60)
GFR calc Af Amer: >60 mL/min (ref >60)
GFR calc Af Amer: >60 mL/min (ref >60)
GFR calc non Af Amer: >60 mL/min (ref >60)
GFR calc non Af Amer: >60 mL/min (ref >60)
GFR calc non Af Amer: >60 mL/min (ref >60)
Glucose, Bld: 216 mg/dL — ABNORMAL HIGH (ref 70–99)
Glucose, Bld: 229 mg/dL — ABNORMAL HIGH (ref 70–99)
Glucose, Bld: 244 mg/dL — ABNORMAL HIGH (ref 70–99)
Potassium: 2.8 mEq/L — ABNORMAL LOW (ref 3.5–5.1)
Sodium: 138 mEq/L (ref 135–145)
Sodium: 139 mEq/L (ref 135–145)
Sodium: 143 mEq/L (ref 135–145)

## 2010-12-07 LAB — DIFFERENTIAL
Basophils Absolute: 0 10*3/uL (ref 0.0–0.1)
Basophils Absolute: 0 10*3/uL (ref 0.0–0.1)
Basophils Absolute: 0 10*3/uL (ref 0.0–0.1)
Basophils Relative: 0 % (ref 0–1)
Basophils Relative: 0 % (ref 0–1)
Basophils Relative: 0 % (ref 0–1)
Eosinophils Absolute: 0 10*3/uL (ref 0.0–0.7)
Eosinophils Absolute: 0 10*3/uL (ref 0.0–0.7)
Eosinophils Absolute: 0 10*3/uL (ref 0.0–0.7)
Eosinophils Absolute: 0.4 10*3/uL (ref 0.0–0.7)
Eosinophils Relative: 0 % (ref 0–5)
Lymphocytes Relative: 29 % (ref 12–46)
Lymphocytes Relative: 8 % — ABNORMAL LOW (ref 12–46)
Lymphs Abs: 1.7 10*3/uL (ref 0.7–4.0)
Lymphs Abs: 1.8 10*3/uL (ref 0.7–4.0)
Monocytes Absolute: 1.3 10*3/uL — ABNORMAL HIGH (ref 0.1–1.0)
Monocytes Relative: 0 % — ABNORMAL LOW (ref 3–12)
Monocytes Relative: 6 % (ref 3–12)
Monocytes Relative: 8 % (ref 3–12)
Neutro Abs: 18.8 10*3/uL — ABNORMAL HIGH (ref 1.7–7.7)
Neutro Abs: 21.1 10*3/uL — ABNORMAL HIGH (ref 1.7–7.7)
Neutro Abs: 7.3 10*3/uL (ref 1.7–7.7)
Neutrophils Relative %: 55 % (ref 43–77)
Neutrophils Relative %: 86 % — ABNORMAL HIGH (ref 43–77)
Neutrophils Relative %: 94 % — ABNORMAL HIGH (ref 43–77)
Neutrophils Relative %: 95 % — ABNORMAL HIGH (ref 43–77)

## 2010-12-07 LAB — GLUCOSE, CAPILLARY
Glucose-Capillary: 101 mg/dL — ABNORMAL HIGH (ref 70–99)
Glucose-Capillary: 146 mg/dL — ABNORMAL HIGH (ref 70–99)
Glucose-Capillary: 163 mg/dL — ABNORMAL HIGH (ref 70–99)
Glucose-Capillary: 166 mg/dL — ABNORMAL HIGH (ref 70–99)
Glucose-Capillary: 202 mg/dL — ABNORMAL HIGH (ref 70–99)
Glucose-Capillary: 202 mg/dL — ABNORMAL HIGH (ref 70–99)
Glucose-Capillary: 228 mg/dL — ABNORMAL HIGH (ref 70–99)
Glucose-Capillary: 229 mg/dL — ABNORMAL HIGH (ref 70–99)
Glucose-Capillary: 259 mg/dL — ABNORMAL HIGH (ref 70–99)
Glucose-Capillary: 283 mg/dL — ABNORMAL HIGH (ref 70–99)
Glucose-Capillary: 131 mg/dL — ABNORMAL HIGH (ref 70–99)

## 2010-12-07 LAB — CBC
HCT: 44.7 % (ref 39.0–52.0)
Hemoglobin: 14.1 g/dL (ref 13.0–17.0)
Hemoglobin: 14.2 g/dL (ref 13.0–17.0)
Hemoglobin: 14.5 g/dL (ref 13.0–17.0)
MCH: 30.8 pg (ref 26.0–34.0)
MCH: 31.4 pg (ref 26.0–34.0)
MCH: 31.5 pg (ref 26.0–34.0)
MCH: 31.9 pg (ref 26.0–34.0)
MCHC: 32.5 g/dL (ref 30.0–36.0)
MCHC: 33.3 g/dL (ref 30.0–36.0)
MCHC: 33.6 g/dL (ref 30.0–36.0)
MCV: 94.8 fL (ref 78.0–100.0)
Platelets: 241 10*3/uL (ref 150–400)
Platelets: 269 10*3/uL (ref 150–400)
Platelets: 274 10*3/uL (ref 150–400)
RBC: 4.51 MIL/uL (ref 4.22–5.81)
RBC: 4.71 MIL/uL (ref 4.22–5.81)
RDW: 13.3 % (ref 11.5–15.5)
RDW: 13.4 % (ref 11.5–15.5)
WBC: 21.8 10*3/uL — ABNORMAL HIGH (ref 4.0–10.5)
WBC: 6.1 10*3/uL (ref 4.0–10.5)

## 2010-12-07 LAB — CARDIAC PANEL(CRET KIN+CKTOT+MB+TROPI)
CK, MB: 2.8 ng/mL (ref 0.3–4.0)
CK, MB: 4.9 ng/mL — ABNORMAL HIGH (ref 0.3–4.0)
Relative Index: 1.5 (ref 0.0–2.5)
Relative Index: 2.8 — ABNORMAL HIGH (ref 0.0–2.5)
Total CK: 168 U/L (ref 7–232)
Total CK: 181 U/L (ref 7–232)
Troponin I: 0.02 ng/mL (ref 0.00–0.06)
Troponin I: 0.02 ng/mL (ref 0.00–0.06)
Troponin I: <0.01 ng/mL (ref 0.00–0.06)

## 2010-12-07 LAB — BRAIN NATRIURETIC PEPTIDE: Pro B Natriuretic peptide (BNP): <30 pg/mL (ref 0.0–100.0)

## 2010-12-07 LAB — HEMOGLOBIN A1C: Hgb A1c MFr Bld: 6.1 % — ABNORMAL HIGH (ref <5.7)

## 2010-12-08 LAB — CBC
Hemoglobin: 14.4 g/dL (ref 13.0–17.0)
MCH: 31.1 pg (ref 26.0–34.0)
MCHC: 33.2 g/dL (ref 30.0–36.0)
MCV: 93.9 fL (ref 78.0–100.0)
RBC: 4.64 MIL/uL (ref 4.22–5.81)

## 2010-12-08 LAB — BASIC METABOLIC PANEL
CO2: 30 mEq/L (ref 19–32)
Calcium: 9 mg/dL (ref 8.4–10.5)
Chloride: 97 mEq/L (ref 96–112)
GFR calc Af Amer: >60 mL/min (ref >60)
Glucose, Bld: 114 mg/dL — ABNORMAL HIGH (ref 70–99)
Sodium: 134 mEq/L — ABNORMAL LOW (ref 135–145)

## 2010-12-08 LAB — DIFFERENTIAL
Basophils Relative: 2 % — ABNORMAL HIGH (ref 0–1)
Eosinophils Absolute: 0.4 10*3/uL (ref 0.0–0.7)
Eosinophils Relative: 6 % — ABNORMAL HIGH (ref 0–5)
Lymphs Abs: 2 10*3/uL (ref 0.7–4.0)
Monocytes Absolute: 0.5 10*3/uL (ref 0.1–1.0)
Monocytes Relative: 8 % (ref 3–12)

## 2010-12-08 LAB — GLUCOSE, CAPILLARY: Glucose-Capillary: 88 mg/dL (ref 70–99)

## 2010-12-08 NOTE — H&P (Signed)
[name], [name]                ACCOUNT NO.:  [id number]  MEDICAL RECORD NO.:  [id number]           PATIENT TYPE:  I  LOCATION:  0403                          FACILITY:  [facility]  PHYSICIAN:  Eulogio Ditch, MD DATE OF BIRTH:  1956-09-15  DATE OF ADMISSION:  12/06/2010 DATE OF DISCHARGE:                      PSYCHIATRIC ADMISSION ASSESSMENT   IDENTIFYING INFORMATION:  This is a 54 year old male who was voluntarily admitted December 05, 2010.  HISTORY OF PRESENT ILLNESS:  The patient presents with a history of wanting to get help with his substance use, has been using crack cocaine off and on for the past four to five months with the longest history of sobriety two to three years.  Denies any IV drug use.  Denies any other substance use.  Denies any suicidal or homicidal thoughts.  Endorses auditory hallucinations, non-command type.  He wants to get help and is considering going to an inpatient rehab program.  PAST PSYCHIATRIC HISTORY:  First admission to [facility]. No current outpatient mental health treatment.  SOCIAL HISTORY:  The patient lives in [place].  He lives with his fiance.  He receives an SSI check and denies any legal troubles.  FAMILY HISTORY:  None.  ALCOHOL/DRUG HISTORY:  Denies any other substance use, but has been using crack cocaine.  Primary care provider is Dr. Lysbeth Galas in [place], [place].  MEDICAL PROBLEMS:  History of hypertension and has a shunt, and history of asthma.  MEDICATIONS: 1. He list lisinopril 15 mg daily. 2. Advair Diskus 550. 3. Albuterol inhalers as needed. 4. Amlodipine 10 mg daily. 5. Glyburide 5 mg daily. 6. Spiriva 15 mcg unknown frequency with last use of medications     approximately 2 months ago.  DRUG ALLERGIES:  MORPHINE, complaints of itching.  PHYSICAL EXAM:  This is a well-nourished, well-developed male, assessed in the emergency department.  Physical exam was reviewed.  Of note, the patient  had some bruising under his toenails of both great toes.  LABORATORY DATA:  Shows a glucose of 112.  Urine drug screen positive for cocaine.  X-ray showed no acute cardiopulmonary abnormalities.  CBC within normal limits.  Alcohol level less than 5.  Potassium at 3.3.  MENTAL STATUS EXAM:  The patient is fully alert and cooperative, resting in bed.  His speech is clear.  He does not appear to be actively responding.  He denies any psychotic symptoms at this time.  He denies any suicidal or homicidal thoughts.  He feels tired.  AXIS I:  Substance use, mood disorder, cocaine abuse, rule out dependence. AXIS II:  Deferred. AXIS III:  History of hypertension, ventriculoperitoneal shunt, high cholesterol and type 2 diabetes. AXIS IV:  Medical problems, chronic substance use. AXIS V:  Current is 35-40.  PLAN:  Our plan is to monitor the patient's blood sugars, put the patient on a 60-g carb diet.  We will monitor his blood pressures, assess his motivation for rehab.  We will continue to assess other comorbidities in his support group.  Tentative length of stay at this time is 3-4 days.     Landry Corporal, N.P.   ______________________________  Eulogio Ditch, MD    JO/MEDQ  D:  12/06/2010  T:  12/06/2010  Job:  [id number]  Electronically Signed by Limmie PatriciaP. on 12/07/2010 09:18:34 AM Electronically Signed by Eulogio Ditch  on 12/08/2010 04:03:17 AM

## 2010-12-13 ENCOUNTER — Emergency Department (HOSPITAL_COMMUNITY)
Admission: EM | Admit: 2010-12-13 | Discharge: 2010-12-13 | Disposition: A | Discharge status: Home / Self Care | Payer: Medicaid Other | Attending: Emergency Medicine | Admitting: Emergency Medicine

## 2010-12-13 DIAGNOSIS — R0789 Other chest pain: Secondary | ICD-10-CM | POA: Insufficient documentation

## 2010-12-13 DIAGNOSIS — E119 Type 2 diabetes mellitus without complications: Secondary | ICD-10-CM | POA: Insufficient documentation

## 2010-12-13 DIAGNOSIS — R0602 Shortness of breath: Secondary | ICD-10-CM | POA: Insufficient documentation

## 2010-12-13 DIAGNOSIS — I10 Essential (primary) hypertension: Secondary | ICD-10-CM | POA: Insufficient documentation

## 2010-12-13 DIAGNOSIS — J45901 Unspecified asthma with (acute) exacerbation: Secondary | ICD-10-CM | POA: Insufficient documentation

## 2010-12-18 ENCOUNTER — Emergency Department (HOSPITAL_COMMUNITY)
Admission: EM | Admit: 2010-12-18 | Discharge: 2010-12-18 | Disposition: A | Discharge status: Home / Self Care | Payer: Medicaid Other | Attending: Emergency Medicine | Admitting: Emergency Medicine

## 2010-12-18 DIAGNOSIS — R059 Cough, unspecified: Secondary | ICD-10-CM | POA: Insufficient documentation

## 2010-12-18 DIAGNOSIS — R05 Cough: Secondary | ICD-10-CM | POA: Insufficient documentation

## 2010-12-18 DIAGNOSIS — E119 Type 2 diabetes mellitus without complications: Secondary | ICD-10-CM | POA: Insufficient documentation

## 2010-12-18 DIAGNOSIS — R0989 Other specified symptoms and signs involving the circulatory and respiratory systems: Secondary | ICD-10-CM | POA: Insufficient documentation

## 2010-12-18 DIAGNOSIS — J45909 Unspecified asthma, uncomplicated: Secondary | ICD-10-CM | POA: Insufficient documentation

## 2010-12-18 DIAGNOSIS — J45901 Unspecified asthma with (acute) exacerbation: Secondary | ICD-10-CM | POA: Insufficient documentation

## 2010-12-18 DIAGNOSIS — R0609 Other forms of dyspnea: Secondary | ICD-10-CM | POA: Insufficient documentation

## 2010-12-18 DIAGNOSIS — R51 Headache: Secondary | ICD-10-CM | POA: Insufficient documentation

## 2010-12-20 ENCOUNTER — Emergency Department (HOSPITAL_COMMUNITY): Payer: Medicaid Other

## 2010-12-20 ENCOUNTER — Emergency Department (HOSPITAL_COMMUNITY)
Admission: EM | Admit: 2010-12-20 | Discharge: 2010-12-20 | Disposition: A | Discharge status: Home / Self Care | Payer: Medicaid Other | Attending: Emergency Medicine | Admitting: Emergency Medicine

## 2010-12-20 DIAGNOSIS — R0602 Shortness of breath: Secondary | ICD-10-CM | POA: Insufficient documentation

## 2010-12-20 DIAGNOSIS — E119 Type 2 diabetes mellitus without complications: Secondary | ICD-10-CM | POA: Insufficient documentation

## 2010-12-20 DIAGNOSIS — J45909 Unspecified asthma, uncomplicated: Secondary | ICD-10-CM | POA: Insufficient documentation

## 2010-12-20 DIAGNOSIS — I10 Essential (primary) hypertension: Secondary | ICD-10-CM | POA: Insufficient documentation

## 2011-02-05 ENCOUNTER — Emergency Department (HOSPITAL_COMMUNITY): Payer: Medicaid Other

## 2011-02-05 ENCOUNTER — Emergency Department (HOSPITAL_COMMUNITY)
Admission: EM | Admit: 2011-02-05 | Discharge: 2011-02-05 | Disposition: A | Discharge status: Home / Self Care | Payer: Medicaid Other | Attending: Emergency Medicine | Admitting: Emergency Medicine

## 2011-02-05 DIAGNOSIS — R51 Headache: Secondary | ICD-10-CM | POA: Insufficient documentation

## 2011-02-05 DIAGNOSIS — I10 Essential (primary) hypertension: Secondary | ICD-10-CM | POA: Insufficient documentation

## 2011-02-05 DIAGNOSIS — E119 Type 2 diabetes mellitus without complications: Secondary | ICD-10-CM | POA: Insufficient documentation

## 2011-02-05 DIAGNOSIS — R059 Cough, unspecified: Secondary | ICD-10-CM | POA: Insufficient documentation

## 2011-02-05 DIAGNOSIS — Z79899 Other long term (current) drug therapy: Secondary | ICD-10-CM | POA: Insufficient documentation

## 2011-02-05 DIAGNOSIS — R05 Cough: Secondary | ICD-10-CM | POA: Insufficient documentation

## 2011-02-05 DIAGNOSIS — F1410 Cocaine abuse, uncomplicated: Secondary | ICD-10-CM | POA: Insufficient documentation

## 2011-02-05 DIAGNOSIS — J45909 Unspecified asthma, uncomplicated: Secondary | ICD-10-CM | POA: Insufficient documentation

## 2011-02-05 DIAGNOSIS — R071 Chest pain on breathing: Secondary | ICD-10-CM | POA: Insufficient documentation

## 2011-02-05 LAB — CBC
HCT: 42.3 % (ref 39.0–52.0)
MCH: 31.6 pg (ref 26.0–34.0)
MCV: 93 fL (ref 78.0–100.0)
RDW: 12.5 % (ref 11.5–15.5)
WBC: 8.7 10*3/uL (ref 4.0–10.5)

## 2011-02-05 LAB — BASIC METABOLIC PANEL
BUN: 16 mg/dL (ref 6–23)
Creatinine, Ser: 1.1 mg/dL (ref 0.4–1.5)
GFR calc non Af Amer: >60 mL/min (ref >60)
Glucose, Bld: 103 mg/dL — ABNORMAL HIGH (ref 70–99)
Potassium: 3.1 mEq/L — ABNORMAL LOW (ref 3.5–5.1)

## 2011-02-05 LAB — DIFFERENTIAL
Eosinophils Absolute: 0.5 10*3/uL (ref 0.0–0.7)
Eosinophils Relative: 6 % — ABNORMAL HIGH (ref 0–5)
Lymphocytes Relative: 38 % (ref 12–46)
Lymphs Abs: 3.3 10*3/uL (ref 0.7–4.0)
Monocytes Absolute: 0.8 10*3/uL (ref 0.1–1.0)
Monocytes Relative: 9 % (ref 3–12)

## 2011-02-05 LAB — RAPID URINE DRUG SCREEN, HOSP PERFORMED
Amphetamines: NOT DETECTED
Barbiturates: NOT DETECTED
Benzodiazepines: NOT DETECTED
Cocaine: POSITIVE — AB

## 2011-02-05 LAB — POCT CARDIAC MARKERS: Troponin i, poc: <0.05 ng/mL (ref 0.00–0.09)

## 2011-02-08 ENCOUNTER — Emergency Department (HOSPITAL_COMMUNITY)
Admission: EM | Admit: 2011-02-08 | Discharge: 2011-02-08 | Disposition: A | Discharge status: Home / Self Care | Payer: Medicaid Other | Attending: Emergency Medicine | Admitting: Emergency Medicine

## 2011-02-08 ENCOUNTER — Emergency Department (HOSPITAL_COMMUNITY): Payer: Medicaid Other

## 2011-02-08 DIAGNOSIS — R079 Chest pain, unspecified: Secondary | ICD-10-CM | POA: Insufficient documentation

## 2011-02-08 DIAGNOSIS — E669 Obesity, unspecified: Secondary | ICD-10-CM | POA: Insufficient documentation

## 2011-02-08 DIAGNOSIS — R109 Unspecified abdominal pain: Secondary | ICD-10-CM | POA: Insufficient documentation

## 2011-02-08 DIAGNOSIS — K921 Melena: Secondary | ICD-10-CM | POA: Insufficient documentation

## 2011-02-08 DIAGNOSIS — K922 Gastrointestinal hemorrhage, unspecified: Secondary | ICD-10-CM | POA: Insufficient documentation

## 2011-02-08 DIAGNOSIS — Z79899 Other long term (current) drug therapy: Secondary | ICD-10-CM | POA: Insufficient documentation

## 2011-02-08 DIAGNOSIS — I10 Essential (primary) hypertension: Secondary | ICD-10-CM | POA: Insufficient documentation

## 2011-02-08 DIAGNOSIS — N323 Diverticulum of bladder: Secondary | ICD-10-CM | POA: Insufficient documentation

## 2011-02-08 DIAGNOSIS — E119 Type 2 diabetes mellitus without complications: Secondary | ICD-10-CM | POA: Insufficient documentation

## 2011-02-08 LAB — URINALYSIS, ROUTINE W REFLEX MICROSCOPIC
Glucose, UA: NEGATIVE mg/dL
Hgb urine dipstick: NEGATIVE
Specific Gravity, Urine: 1.011 (ref 1.005–1.030)

## 2011-02-08 LAB — BASIC METABOLIC PANEL
CO2: 30 mEq/L (ref 19–32)
Chloride: 103 mEq/L (ref 96–112)
GFR calc Af Amer: >60 mL/min (ref >60)
Sodium: 142 mEq/L (ref 135–145)

## 2011-02-08 LAB — CBC
HCT: 42.1 % (ref 39.0–52.0)
Hemoglobin: 13.5 g/dL (ref 13.0–17.0)
MCH: 29.6 pg (ref 26.0–34.0)
RBC: 4.56 MIL/uL (ref 4.22–5.81)

## 2011-02-08 LAB — OCCULT BLOOD, POC DEVICE: Fecal Occult Bld: POSITIVE

## 2011-02-08 MED ORDER — IOHEXOL 300 MG/ML  SOLN
100.0000 mL | Freq: Once | INTRAMUSCULAR | Status: AC | PRN
  Administered 2011-02-08: 100 mL via INTRAVENOUS

## 2011-02-09 NOTE — Op Note (Signed)
[name], [name]                ACCOUNT NO.:  [id number]   MEDICAL RECORD NO.:  [id number]          PATIENT TYPE:  AMB   LOCATION:  DAY                          FACILITY:  [facility]   PHYSICIAN:  Lebron Conners, M.D.   DATE OF BIRTH:  June 11, 1956   DATE OF PROCEDURE:  12/14/2005  DATE OF DISCHARGE:  12/14/2005                                 OPERATIVE REPORT   PREOPERATIVE DIAGNOSIS:  Phimosis.   POSTOPERATIVE DIAGNOSIS:  Phimosis.   OPERATIONS:  Circumcision.   SURGEON:  Dr. Orson Slick   ANESTHESIA:  General and local.   BLOOD LOSS:  Minimal.   COMPLICATIONS:  None.   CONDITION:  To PACU, good.   PROCEDURE:  After the patient was monitored and anesthetized and had routine  preparation and draping of genitalia, I marked the sites of elective  resection of the foreskin on the external penile shaft skin and then with  difficulty, retracted the foreskin to expose the glans and marked site there  approximately 1.5 cm proximal to the glans.  I then made skin incisions at  place and removed the foreskin using the cautery.  I got hemostasis with the  cautery.  I reapproximated the skin, taking care not to twist at all, using  a combination of running and interrupted 3-0 Vicryl suture.  I applied a  nonstick slightly compressive bandage.  I did a penile block at the base the  penis using long-acting local anesthetic with no epinephrine in it.  The  patient tolerated the operation well.      Lebron Conners, M.D.  Electronically Signed     WB/MEDQ  D:  12/14/2005  T:  12/17/2005  Job:  [id number]

## 2011-02-10 ENCOUNTER — Emergency Department (HOSPITAL_COMMUNITY): Payer: Medicaid Other

## 2011-02-10 ENCOUNTER — Emergency Department (HOSPITAL_COMMUNITY)
Admission: EM | Admit: 2011-02-10 | Discharge: 2011-02-10 | Disposition: A | Discharge status: Home / Self Care | Payer: Medicaid Other | Attending: Emergency Medicine | Admitting: Emergency Medicine

## 2011-02-10 DIAGNOSIS — J45901 Unspecified asthma with (acute) exacerbation: Secondary | ICD-10-CM | POA: Insufficient documentation

## 2011-02-10 DIAGNOSIS — R0602 Shortness of breath: Secondary | ICD-10-CM | POA: Insufficient documentation

## 2011-02-10 DIAGNOSIS — I10 Essential (primary) hypertension: Secondary | ICD-10-CM | POA: Insufficient documentation

## 2011-02-10 DIAGNOSIS — E119 Type 2 diabetes mellitus without complications: Secondary | ICD-10-CM | POA: Insufficient documentation

## 2011-02-10 DIAGNOSIS — Z79899 Other long term (current) drug therapy: Secondary | ICD-10-CM | POA: Insufficient documentation

## 2011-03-05 ENCOUNTER — Emergency Department (HOSPITAL_COMMUNITY): Payer: Medicaid Other

## 2011-03-05 ENCOUNTER — Emergency Department (HOSPITAL_COMMUNITY)
Admission: EM | Admit: 2011-03-05 | Discharge: 2011-03-06 | Disposition: A | Discharge status: Home / Self Care | Payer: Medicaid Other | Attending: Emergency Medicine | Admitting: Emergency Medicine

## 2011-03-05 DIAGNOSIS — E669 Obesity, unspecified: Secondary | ICD-10-CM | POA: Insufficient documentation

## 2011-03-05 DIAGNOSIS — M549 Dorsalgia, unspecified: Secondary | ICD-10-CM | POA: Insufficient documentation

## 2011-03-05 DIAGNOSIS — R0609 Other forms of dyspnea: Secondary | ICD-10-CM | POA: Insufficient documentation

## 2011-03-05 DIAGNOSIS — R0789 Other chest pain: Secondary | ICD-10-CM | POA: Insufficient documentation

## 2011-03-05 DIAGNOSIS — R093 Abnormal sputum: Secondary | ICD-10-CM | POA: Insufficient documentation

## 2011-03-05 DIAGNOSIS — R05 Cough: Secondary | ICD-10-CM | POA: Insufficient documentation

## 2011-03-05 DIAGNOSIS — Z79899 Other long term (current) drug therapy: Secondary | ICD-10-CM | POA: Insufficient documentation

## 2011-03-05 DIAGNOSIS — K429 Umbilical hernia without obstruction or gangrene: Secondary | ICD-10-CM | POA: Insufficient documentation

## 2011-03-05 DIAGNOSIS — R10819 Abdominal tenderness, unspecified site: Secondary | ICD-10-CM | POA: Insufficient documentation

## 2011-03-05 DIAGNOSIS — I10 Essential (primary) hypertension: Secondary | ICD-10-CM | POA: Insufficient documentation

## 2011-03-05 DIAGNOSIS — R51 Headache: Secondary | ICD-10-CM | POA: Insufficient documentation

## 2011-03-05 DIAGNOSIS — R609 Edema, unspecified: Secondary | ICD-10-CM | POA: Insufficient documentation

## 2011-03-05 DIAGNOSIS — R61 Generalized hyperhidrosis: Secondary | ICD-10-CM | POA: Insufficient documentation

## 2011-03-05 DIAGNOSIS — IMO0001 Reserved for inherently not codable concepts without codable children: Secondary | ICD-10-CM | POA: Insufficient documentation

## 2011-03-05 DIAGNOSIS — R0602 Shortness of breath: Secondary | ICD-10-CM | POA: Insufficient documentation

## 2011-03-05 DIAGNOSIS — J45909 Unspecified asthma, uncomplicated: Secondary | ICD-10-CM | POA: Insufficient documentation

## 2011-03-05 DIAGNOSIS — E119 Type 2 diabetes mellitus without complications: Secondary | ICD-10-CM | POA: Insufficient documentation

## 2011-03-05 DIAGNOSIS — J40 Bronchitis, not specified as acute or chronic: Secondary | ICD-10-CM | POA: Insufficient documentation

## 2011-03-05 DIAGNOSIS — F411 Generalized anxiety disorder: Secondary | ICD-10-CM | POA: Insufficient documentation

## 2011-03-05 DIAGNOSIS — R0989 Other specified symptoms and signs involving the circulatory and respiratory systems: Secondary | ICD-10-CM | POA: Insufficient documentation

## 2011-03-05 DIAGNOSIS — R059 Cough, unspecified: Secondary | ICD-10-CM | POA: Insufficient documentation

## 2011-03-05 DIAGNOSIS — R6883 Chills (without fever): Secondary | ICD-10-CM | POA: Insufficient documentation

## 2011-03-05 LAB — BASIC METABOLIC PANEL
CO2: 25 mEq/L (ref 19–32)
CO2: 27 mEq/L (ref 19–32)
Calcium: 8.9 mg/dL (ref 8.4–10.5)
Chloride: 100 mEq/L (ref 96–112)
Creatinine, Ser: 1.2 mg/dL (ref 0.4–1.5)
GFR calc non Af Amer: >60 mL/min (ref >60)
Sodium: 139 mEq/L (ref 135–145)

## 2011-03-05 LAB — CBC
HCT: 43.8 % (ref 39.0–52.0)
Hemoglobin: 14.8 g/dL (ref 13.0–17.0)
MCV: 92.6 fL (ref 78.0–100.0)
Platelets: 284 10*3/uL (ref 150–400)
RBC: 4.73 MIL/uL (ref 4.22–5.81)
WBC: 7.5 10*3/uL (ref 4.0–10.5)

## 2011-03-05 LAB — CK TOTAL AND CKMB (NOT AT ARMC): Total CK: 198 U/L (ref 7–232)

## 2011-03-05 LAB — DIFFERENTIAL
Lymphocytes Relative: 5 % — ABNORMAL LOW (ref 12–46)
Lymphs Abs: 0.4 10*3/uL — ABNORMAL LOW (ref 0.7–4.0)
Neutro Abs: 7.1 10*3/uL (ref 1.7–7.7)
Neutrophils Relative %: 94 % — ABNORMAL HIGH (ref 43–77)

## 2011-03-05 LAB — TROPONIN I: Troponin I: <0.3 ng/mL (ref <0.30)

## 2011-03-08 ENCOUNTER — Emergency Department (HOSPITAL_COMMUNITY)
Admission: EM | Admit: 2011-03-08 | Discharge: 2011-03-08 | Disposition: A | Discharge status: Home / Self Care | Payer: Medicaid Other | Attending: Emergency Medicine | Admitting: Emergency Medicine

## 2011-03-08 DIAGNOSIS — J45909 Unspecified asthma, uncomplicated: Secondary | ICD-10-CM | POA: Insufficient documentation

## 2011-03-08 DIAGNOSIS — R059 Cough, unspecified: Secondary | ICD-10-CM | POA: Insufficient documentation

## 2011-03-08 DIAGNOSIS — I10 Essential (primary) hypertension: Secondary | ICD-10-CM | POA: Insufficient documentation

## 2011-03-08 DIAGNOSIS — E119 Type 2 diabetes mellitus without complications: Secondary | ICD-10-CM | POA: Insufficient documentation

## 2011-03-08 DIAGNOSIS — R05 Cough: Secondary | ICD-10-CM | POA: Insufficient documentation

## 2011-03-26 DIAGNOSIS — R079 Chest pain, unspecified: Secondary | ICD-10-CM

## 2011-05-15 ENCOUNTER — Emergency Department (HOSPITAL_COMMUNITY)
Admission: EM | Admit: 2011-05-15 | Discharge: 2011-05-15 | Disposition: A | Discharge status: Home / Self Care | Payer: Medicaid Other | Attending: Emergency Medicine | Admitting: Emergency Medicine

## 2011-05-15 ENCOUNTER — Encounter (HOSPITAL_COMMUNITY): Payer: Self-pay

## 2011-05-15 ENCOUNTER — Other Ambulatory Visit: Payer: Self-pay

## 2011-05-15 DIAGNOSIS — J449 Chronic obstructive pulmonary disease, unspecified: Secondary | ICD-10-CM

## 2011-05-15 DIAGNOSIS — J441 Chronic obstructive pulmonary disease with (acute) exacerbation: Secondary | ICD-10-CM | POA: Insufficient documentation

## 2011-05-15 DIAGNOSIS — E119 Type 2 diabetes mellitus without complications: Secondary | ICD-10-CM | POA: Insufficient documentation

## 2011-05-15 DIAGNOSIS — R1084 Generalized abdominal pain: Secondary | ICD-10-CM

## 2011-05-15 DIAGNOSIS — R109 Unspecified abdominal pain: Secondary | ICD-10-CM | POA: Insufficient documentation

## 2011-05-15 DIAGNOSIS — J45909 Unspecified asthma, uncomplicated: Secondary | ICD-10-CM | POA: Insufficient documentation

## 2011-05-15 DIAGNOSIS — I10 Essential (primary) hypertension: Secondary | ICD-10-CM | POA: Insufficient documentation

## 2011-05-15 MED ORDER — ALBUTEROL (5 MG/ML) CONTINUOUS INHALATION SOLN
INHALATION_SOLUTION | RESPIRATORY_TRACT | Status: AC
  Filled 2011-05-15: qty 20

## 2011-05-15 MED ORDER — PREDNISONE 20 MG PO TABS
60.0000 mg | ORAL_TABLET | Freq: Once | ORAL | Status: AC
  Administered 2011-05-15: 60 mg via ORAL
  Filled 2011-05-15: qty 3

## 2011-05-15 MED ORDER — HYDROCODONE-ACETAMINOPHEN 5-325 MG PO TABS
2.0000 | ORAL_TABLET | Freq: Once | ORAL | Status: AC
  Administered 2011-05-15: 2 via ORAL
  Filled 2011-05-15: qty 2

## 2011-05-15 MED ORDER — HYDROCODONE-ACETAMINOPHEN 5-500 MG PO TABS: 1.0000 | ORAL_TABLET | Freq: Four times a day (QID) | ORAL | Status: DC | PRN

## 2011-05-15 MED ORDER — IPRATROPIUM BROMIDE 0.02 % IN SOLN
0.5000 mg | Freq: Once | RESPIRATORY_TRACT | Status: AC
  Administered 2011-05-15: 0.5 mg via RESPIRATORY_TRACT
  Filled 2011-05-15: qty 2.5

## 2011-05-15 MED ORDER — ALBUTEROL SULFATE (5 MG/ML) 0.5% IN NEBU
10.0000 mg | INHALATION_SOLUTION | Freq: Once | RESPIRATORY_TRACT | Status: AC
  Administered 2011-05-15: 10 mg via RESPIRATORY_TRACT

## 2011-05-15 MED ORDER — ALBUTEROL SULFATE (2.5 MG/3ML) 0.083% IN NEBU: 5.0000 mg | INHALATION_SOLUTION | Freq: Four times a day (QID) | RESPIRATORY_TRACT | Status: DC | PRN

## 2011-05-15 MED ORDER — ALBUTEROL SULFATE (5 MG/ML) 0.5% IN NEBU: 5.0000 mg | INHALATION_SOLUTION | Freq: Once | RESPIRATORY_TRACT | Status: DC

## 2011-05-15 MED ORDER — PREDNISONE 50 MG PO TABS: 50.0000 mg | ORAL_TABLET | Freq: Every day | ORAL | Status: AC

## 2011-05-15 MED ORDER — ALBUTEROL SULFATE (5 MG/ML) 0.5% IN NEBU
5.0000 mg | INHALATION_SOLUTION | Freq: Once | RESPIRATORY_TRACT | Status: AC
  Administered 2011-05-15: 5 mg via RESPIRATORY_TRACT
  Filled 2011-05-15: qty 1

## 2011-05-15 NOTE — ED Provider Notes (Signed)
Scribed for Joya Gaskins, MD, the patient was seen in room APA10/APA10 . This chart was scribed by Ellie Lunch. This patient's care was started at 11:41 AM.   CSN: [id number] Arrival date & time: 05/15/2011 11:10 AM  Chief Complaint  Patient presents with  . Respiratory Distress   Patient is a 55 y.o. male presenting with shortness of breath and abdominal pain. The history is provided by the patient.  Shortness of Breath  The current episode started 2 days ago. The onset was gradual. The problem occurs continuously. The problem has been gradually worsening. The problem is severe. The symptoms are aggravated by activity. Associated symptoms include cough, shortness of breath and wheezing. Chest pain: associated with SOB. His past medical history is significant for asthma.  Abdominal Pain The primary symptoms of the illness include abdominal pain and shortness of breath.  The abdominal pain began more than 2 days ago. The abdominal pain has been unchanged since its onset. The abdominal pain is located in the RUQ and left flank. The abdominal pain does not radiate.  The patient's medical history is significant for asthma.   [name] is a 55 y.o. male who presents to the Emergency Department complaining of abdominal pain and SOB. Pt reports he was at [facility] from Tuesday 05/08/11 to Saturday 05/11/2009 for a kidney stone. Pt reports abdominal pain has worsened since being discharged. Pt also reports a history of COPD and asthma and c/o SOB and wheezing that has worsened in that past three days since he was discharged. Pt c/o associated coughing and chest pain.  He reports SOB is consistent for the past several days Patient denies history of heart disease or MI. PT reports having a shunt placed in head in 06. Reports no new HA issues.  Pt was seen at 11:18.  Past Medical History  Diagnosis Date  . Diabetes mellitus   . Hypertension   . Asthma     Past Surgical History    Procedure Date  . Brain surgery    MEDICATIONS:  Previous Medications   ALBUTEROL (PROVENTIL,VENTOLIN) 90 MCG/ACT INHALER    Inhale 2 puffs into the lungs every 6 (six) hours as needed. Asthma Symptoms    AMLODIPINE-VALSARTAN (EXFORGE) 10-320 MG PER TABLET    Take 1 tablet by mouth daily.     DIVALPROEX (DEPAKOTE) 500 MG EC TABLET    Take 500 mg by mouth 2 (two) times daily.     FLUTICASONE-SALMETEROL (ADVAIR DISKUS) 500-50 MCG/DOSE AEPB    Inhale 1 puff into the lungs daily as needed. Asthma Symptoms    GLIPIZIDE (GLUCOTROL) 5 MG TABLET    Take 5 mg by mouth daily.     GLYBURIDE (DIABETA) 5 MG TABLET    Take 5 mg by mouth daily.     HYDROCODONE-ACETAMINOPHEN (NORCO) 7.5-325 MG PER TABLET    Take 1 tablet by mouth every 6 (six) hours as needed. Pain    METFORMIN (GLUCOPHAGE) 500 MG TABLET    Take 500 mg by mouth daily.     TOPIRAMATE (TOPAMAX) 50 MG TABLET    Take 50 mg by mouth daily.       ALLERGIES:  Allergies as of 05/15/2011 - Review Complete 05/15/2011  Allergen Reaction Noted  . Morphine Itching      No family history on file.  History  Substance Use Topics  . Smoking status: Never Smoker   . Smokeless tobacco: Not on file  . Alcohol Use: No  Review of Systems  Respiratory: Positive for cough, shortness of breath and wheezing.   Cardiovascular: Chest pain: associated with SOB.  Gastrointestinal: Positive for abdominal pain.  Genitourinary: Positive for flank pain.  Neurological: Negative for dizziness and headaches.  All other systems reviewed and are negative.    Physical Exam  BP 142/96  Pulse 75  Temp(Src) 98.1 F (36.7 C) (Oral)  Resp 18  Ht 5\' 8"  (1.727 m)  Wt 240 lb (108.863 kg)  BMI 36.49 kg/m2  SpO2 96%  Physical Exam  CONSTITUTIONAL: Well developed/well nourished HEAD AND FACE: Normocephalic/atraumatic EYES: EOMI/PERRL ENMT: Mucous membranes moist NECK: supple no meningeal signs SPINE:entire spine nontender CV: S1/S2 noted, no  murmurs/rubs/gallops noted LUNGS: bilateral wheezing, tachypnea ABDOMEN: soft, nontender, no rebound or guarding GU:no cva tenderness NEURO: Pt is awake/alert, moves all extremitiesx4 EXTREMITIES: pulses normal, full ROM, no pedal edema SKIN: warm, color normal PSYCH: no abnormalities of mood noted  OTHER DATA REVIEWED: Nursing notes, vital signs, and past medical records reviewed.   DIAGNOSTIC STUDIES: Oxygen Saturation is 96% on room air, normal by my interpretation.     Date: 05/15/2011  Rate: 74  Rhythm: normal sinus rhythm  QRS Axis: normal  Intervals: normal  ST/T Wave abnormalities: nonspecific ST changes  Conduction Disutrbances:none  Narrative Interpretation:      ED COURSE / COORDINATION OF CARE: I am awaiting results from [facility] for recent admission Defer imaging/labs until that arrives 12:27 PM Pt well appearing, abd soft on my exam.  He reports he missed a specialist f/u for his abd pain this week as he reports he had too much pain  12:38 PM records from [facility] arrived Pt with renal stone abd US showed no significant cholelithiasis/cystitis, otherwise normal Normal hepatobiliary nuc med scan   MDM: I d/w patient need for f/u for abd pain, but does not appear to be acute issue today Will continue alb nebs for his wheezing 12:58 PM   Pt improved, talking on phone and he talks to me without hypoxia, no tachypnea Lung sounds improved No hypoxia Will tx as COPD exacerbation Pt agreeable  Procedures  I personally performed the services described in this documentation, which was scribed in my presence. The recorded information has been reviewed and considered. Joya Gaskins, MD        Joya Gaskins, MD 05/15/11 [phone]

## 2011-05-15 NOTE — ED Notes (Signed)
Pt reports being at [facility] from [place] for a kidney stone.  Pt reports getting home and having SOB that has worsened.  Pt also reports pain to his rt upper abd, and left flank pain.

## 2011-05-15 NOTE — ED Notes (Signed)
Pt states that he feels about the same, updated on plan of care

## 2011-05-15 NOTE — ED Notes (Signed)
Pt showing NSR on CCM. NPPB treatment in process.

## 2011-05-15 NOTE — ED Notes (Signed)
Pt resting quietly on stretcher. NSR showing on monitor. Respirations deep and even.

## 2011-05-15 NOTE — ED Notes (Signed)
Pt c/o increasing shortness of breath since leaving [facility] hospital last weekend. Pt states that he was supposed to have gallbladder surgery and something done with his kidney stones but then was discharged from the hospital and he doesn't know why. Pt respirations are deep and irregular. Breath sounds clear and equal bilaterally. Pt has episodes of increased respirations at intervals but are normal at intervals also. States that he has an occasional cough and yellow phlegm at times.

## 2011-05-15 NOTE — ED Notes (Signed)
Pt short of breath with walking. Pt has normal respiratory pattern while lying in bed. Talking on phone with no shortness of breath noted.

## 2011-06-25 HISTORY — PX: COLON SURGERY: SHX602

## 2011-11-20 ENCOUNTER — Ambulatory Visit (INDEPENDENT_AMBULATORY_CARE_PROVIDER_SITE_OTHER): Payer: Medicaid Other | Admitting: Family Medicine

## 2011-11-20 ENCOUNTER — Encounter: Payer: Self-pay | Admitting: Family Medicine

## 2011-11-20 VITALS — BP 188/86 | HR 57 | Resp 18 | Ht 68.0 in | Wt 237.1 lb

## 2011-11-20 DIAGNOSIS — Z982 Presence of cerebrospinal fluid drainage device: Secondary | ICD-10-CM | POA: Insufficient documentation

## 2011-11-20 DIAGNOSIS — R109 Unspecified abdominal pain: Secondary | ICD-10-CM

## 2011-11-20 DIAGNOSIS — G8929 Other chronic pain: Secondary | ICD-10-CM | POA: Insufficient documentation

## 2011-11-20 DIAGNOSIS — R519 Headache, unspecified: Secondary | ICD-10-CM

## 2011-11-20 DIAGNOSIS — Z79899 Other long term (current) drug therapy: Secondary | ICD-10-CM

## 2011-11-20 DIAGNOSIS — IMO0002 Reserved for concepts with insufficient information to code with codable children: Secondary | ICD-10-CM

## 2011-11-20 DIAGNOSIS — I10 Essential (primary) hypertension: Secondary | ICD-10-CM

## 2011-11-20 DIAGNOSIS — F1910 Other psychoactive substance abuse, uncomplicated: Secondary | ICD-10-CM

## 2011-11-20 DIAGNOSIS — E669 Obesity, unspecified: Secondary | ICD-10-CM

## 2011-11-20 DIAGNOSIS — R51 Headache: Secondary | ICD-10-CM

## 2011-11-20 DIAGNOSIS — I509 Heart failure, unspecified: Secondary | ICD-10-CM

## 2011-11-20 DIAGNOSIS — E785 Hyperlipidemia, unspecified: Secondary | ICD-10-CM

## 2011-11-20 DIAGNOSIS — Z298 Encounter for other specified prophylactic measures: Secondary | ICD-10-CM

## 2011-11-20 DIAGNOSIS — J45901 Unspecified asthma with (acute) exacerbation: Secondary | ICD-10-CM

## 2011-11-20 DIAGNOSIS — E119 Type 2 diabetes mellitus without complications: Secondary | ICD-10-CM

## 2011-11-20 MED ORDER — LISINOPRIL 10 MG PO TABS: 10.0000 mg | ORAL_TABLET | Freq: Every day | ORAL | Status: DC

## 2011-11-20 MED ORDER — HYDROCODONE-ACETAMINOPHEN 7.5-325 MG PO TABS: 1.0000 | ORAL_TABLET | Freq: Four times a day (QID) | ORAL | Status: DC | PRN

## 2011-11-20 MED ORDER — DIVALPROEX SODIUM 500 MG PO DR TAB: 500.0000 mg | DELAYED_RELEASE_TABLET | Freq: Two times a day (BID) | ORAL | Status: DC

## 2011-11-20 MED ORDER — IPRATROPIUM BROMIDE 0.02 % IN SOLN
0.5000 mg | Freq: Once | RESPIRATORY_TRACT | Status: AC
  Administered 2011-11-20: 0.5 mg via RESPIRATORY_TRACT

## 2011-11-20 MED ORDER — PREDNISONE 10 MG PO TABS: ORAL_TABLET | ORAL | Status: DC

## 2011-11-20 MED ORDER — ALBUTEROL SULFATE (5 MG/ML) 0.5% IN NEBU
2.5000 mg | INHALATION_SOLUTION | Freq: Once | RESPIRATORY_TRACT | Status: AC
  Administered 2011-11-20: 2.5 mg via RESPIRATORY_TRACT

## 2011-11-20 MED ORDER — METFORMIN HCL 500 MG PO TABS: 500.0000 mg | ORAL_TABLET | Freq: Two times a day (BID) | ORAL | Status: DC

## 2011-11-20 MED ORDER — TOPIRAMATE 50 MG PO TABS: 50.0000 mg | ORAL_TABLET | Freq: Every day | ORAL | Status: DC

## 2011-11-20 MED ORDER — ALBUTEROL SULFATE HFA 108 (90 BASE) MCG/ACT IN AERS: 2.0000 | INHALATION_SPRAY | Freq: Four times a day (QID) | RESPIRATORY_TRACT | Status: DC | PRN

## 2011-11-20 MED ORDER — AMLODIPINE BESYLATE-VALSARTAN 10-320 MG PO TABS: 1.0000 | ORAL_TABLET | Freq: Every day | ORAL | Status: DC

## 2011-11-20 MED ORDER — FLUTICASONE-SALMETEROL 500-50 MCG/DOSE IN AEPB: 1.0000 | INHALATION_SPRAY | Freq: Every day | RESPIRATORY_TRACT | Status: DC | PRN

## 2011-11-20 MED ORDER — SODIUM CHLORIDE 0.9 % IV SOLN
125.0000 mg | Freq: Once | INTRAVENOUS | Status: AC
  Administered 2011-11-20: 130 mg via INTRAMUSCULAR

## 2011-11-20 MED ORDER — ALBUTEROL SULFATE (2.5 MG/3ML) 0.083% IN NEBU: 5.0000 mg | INHALATION_SOLUTION | Freq: Four times a day (QID) | RESPIRATORY_TRACT | Status: DC | PRN

## 2011-11-20 NOTE — Assessment & Plan Note (Signed)
He is status post surgery. I will obtain records regarding this. His bowels are moving and he is using a stool softener.

## 2011-11-20 NOTE — Assessment & Plan Note (Signed)
Obtain lipid panel

## 2011-11-20 NOTE — Progress Notes (Signed)
Addended by: Kandis Fantasia B on: 11/20/2011 04:54 PM   Modules accepted: Orders

## 2011-11-20 NOTE — Assessment & Plan Note (Signed)
Will restart for exforge and lisinopril

## 2011-11-20 NOTE — Assessment & Plan Note (Signed)
This appears to be a sequela since his shunt was placed. He was prescribed Lortab for his chronic pain. I will obtain records from his neurologist.

## 2011-11-20 NOTE — Assessment & Plan Note (Signed)
No recent use. He was placed on a pain contract. Based on his history I do believe he needs pain medication.

## 2011-11-20 NOTE — Assessment & Plan Note (Signed)
Diastolic dysfunction per previous records. He's not on a beta blocker but is on an ACE inhibitor. No diuretic needed.

## 2011-11-20 NOTE — Progress Notes (Signed)
  Subjective:    [name], male    DOB: [birth year], 56 y.o.   MRN: [id number]  HPI The patient presents to establish care. Previous primary care provider Dr. Celedonio Miyamoto in [facility]. Previous cardiologist Dr. Michelle Piper Degent He had colon surgery in October of 2012 secondary to diverticulitis. There were complications with the surgery and he has had wound infections subsequently. He was sent to [facility] and was released in December. Since then he has not had any of his chronic medications. He had difficulty getting his Medicaid card situated. He was seen by the wound Center during that time but no longer is seeing them.  Hypertension-previously on exploration lisinopril per day medical records in a list of medications patient brought in  Diabetes mellitus- last A1c was less than 6% on review of hospital records and 2012. He has metformin, glyburide, glipizide between both of his medication list. Neither is at max dose   COPD- he has had multiple visits to the emergency room for COPD. He does not have his Advair or nebulizers at this time. He has been wheezing considerably.  Diastolic heart failure-this was noted from hospital records and an old cardiology note. Patient states he has not had a catheterization and has no history of coronary artery disease.  Chronic abdominal pain- he has chronic abdominal pain since his surgery. He is maintained on Lortab. He uses Dulcolax stool softener. He denies any recent substance abuse since he has been involved in a church.  VP shunt-VP shunt placed in 2006. Patient does not know specifics. He is followed by a neurologist at [facility] neurology in [facility]. He was on Depakote for seizure prophylaxis. He also takes pain medication for his chronic headaches which he has had since shunt was placed.     Review of Systems  GEN- denies fatigue, fever, weight loss,weakness,+ recent illness HEENT-  denies eye drainage, change in vision, nasal discharge, CVS- + chest pain, palpitations RESP-+ SOB, cough,+ wheeze ABD- denies N/V, change in stools, abd pain GU- denies dysuria, hematuria, dribbling, incontinence MSK- + joint pain,+ muscle aches, injury Neuro- + headache, deniesdizziness, syncope, seizure activity       Objective:   Physical Exam GEN- NAD, alert and oriented x3 HEENT- PERRL, EOMI, non injected sclera, pink conjunctiva, MMM, oropharynx clear- dentures on top row Neck- Supple, no thyromegaly, no bruit CVS- RRR, no murmur RESP-diffuse wheezing, inspiratory and expiratory, audible wheeze, fair air movement, decreased at bases no rhonchi heard ABD- NABS,soft, appears distended, no fluid wave, non tender, no masses, well healed midline surgical scar EXT- No edema Pulses- Radial, DP- 2+, PT 2+        Assessment & Plan:

## 2011-11-20 NOTE — Assessment & Plan Note (Signed)
I will restart his Depakote back at the level he was at which is 500 mg twice a day and check a serum Depakote level. He did have Topamax on one is his list of medications but I'm not sure he was actually taking this and it was a very low dose

## 2011-11-20 NOTE — Patient Instructions (Addendum)
For your blood pressure- start the exforge and the lisinopril For your diabetes- take Metformin 1 tablet daily for 1 week then increase to 1 tablet twice a day, For your diabetes- take your blood sugar once a day fasting and record For your pain you can take the pain medication twice a day as needed FOr your COPD- restart your inhalers, start prednisone 40mg  by mouth tomorrow Get the labs done 2 days before our next visit- do not eat after midnight For your headaches and seizure prevention restart the depakote at 500mg  1 tablet twice a day F/U in 2 weeks

## 2011-11-20 NOTE — Assessment & Plan Note (Signed)
A1c was 5.4% one year ago. I will obtain recent labs. He was not on maximum dose of any medications. I will continue him on metformin and increase to twice a day. We will hold on the other 2 medications at this time.

## 2011-11-20 NOTE — Assessment & Plan Note (Signed)
Patient has been out of medications for some time. His COPD has deteriorated. Given nebulizer treatment in office with good response. Status post neb he had increased air movement and decrease bronchospasm. Solu-Medrol 125mg   given he will start prednisone burst. As well as his inhalers

## 2011-11-22 ENCOUNTER — Telehealth: Payer: Self-pay | Admitting: Family Medicine

## 2011-11-22 NOTE — Telephone Encounter (Signed)
Spoke with pt and he stated that his pain meds are locked in at 50 dollars under dr. Polly Cobia and he needs to have it changed to dr. Jeanice Lim. Will call back with the number.

## 2011-12-04 ENCOUNTER — Ambulatory Visit (INDEPENDENT_AMBULATORY_CARE_PROVIDER_SITE_OTHER): Payer: Medicaid Other | Admitting: Family Medicine

## 2011-12-04 ENCOUNTER — Ambulatory Visit: Payer: Medicaid Other | Admitting: Family Medicine

## 2011-12-04 ENCOUNTER — Encounter: Payer: Self-pay | Admitting: Family Medicine

## 2011-12-04 VITALS — BP 152/90 | HR 74 | Resp 18 | Ht 68.0 in | Wt 241.1 lb

## 2011-12-04 DIAGNOSIS — Z298 Encounter for other specified prophylactic measures: Secondary | ICD-10-CM

## 2011-12-04 DIAGNOSIS — I10 Essential (primary) hypertension: Secondary | ICD-10-CM

## 2011-12-04 DIAGNOSIS — E119 Type 2 diabetes mellitus without complications: Secondary | ICD-10-CM

## 2011-12-04 DIAGNOSIS — J449 Chronic obstructive pulmonary disease, unspecified: Secondary | ICD-10-CM

## 2011-12-04 DIAGNOSIS — IMO0002 Reserved for concepts with insufficient information to code with codable children: Secondary | ICD-10-CM

## 2011-12-04 LAB — VALPROIC ACID LEVEL: Valproic Acid Lvl: 35.2 ug/mL — ABNORMAL LOW (ref 50.0–100.0)

## 2011-12-04 LAB — COMPREHENSIVE METABOLIC PANEL
ALT: 11 U/L (ref 0–53)
Albumin: 4.1 g/dL (ref 3.5–5.2)
Alkaline Phosphatase: 67 U/L (ref 39–117)
Glucose, Bld: 71 mg/dL (ref 70–99)
Potassium: 3.6 mEq/L (ref 3.5–5.3)
Sodium: 143 mEq/L (ref 135–145)
Total Bilirubin: 1 mg/dL (ref 0.3–1.2)
Total Protein: 6.7 g/dL (ref 6.0–8.3)

## 2011-12-04 LAB — TSH: TSH: 0.861 u[IU]/mL (ref 0.350–4.500)

## 2011-12-04 LAB — CBC
MCH: 30.6 pg (ref 26.0–34.0)
MCHC: 30.5 g/dL (ref 30.0–36.0)
MCV: 100.4 fL — ABNORMAL HIGH (ref 78.0–100.0)
Platelets: 242 10*3/uL (ref 150–400)
RBC: 4.61 MIL/uL (ref 4.22–5.81)
RDW: 14.5 % (ref 11.5–15.5)

## 2011-12-04 LAB — HEMOGLOBIN A1C: Mean Plasma Glucose: 114 mg/dL (ref <117)

## 2011-12-04 LAB — LIPID PANEL: LDL Cholesterol: 97 mg/dL (ref 0–99)

## 2011-12-04 MED ORDER — PREDNISONE 10 MG PO TABS: ORAL_TABLET | ORAL | Status: DC

## 2011-12-04 MED ORDER — AZITHROMYCIN 500 MG PO TABS: 500.0000 mg | ORAL_TABLET | Freq: Every day | ORAL | Status: AC

## 2011-12-04 NOTE — Patient Instructions (Signed)
For your COPD, take the antibiotics as prescribed, take the prednisone Use the advair and nebulizer every 4 hours as needed Your blood pressure is much improved Continue your depakote at the current dose Call a few days before your prescription is due for pain medication  F/U in 2 months

## 2011-12-04 NOTE — Progress Notes (Signed)
  Subjective:    [name], male    DOB: 11/05/1955, 56 y.o.   MRN: [id number]  HPI Patient here to followup hospital visit and review labs here COPD- recent admission to [facility] for COPD exacerbation, he has a script for Medrol Dose pak and Vicodin given 2 days ago but did not fill because he was on a pain contract with me. Breathing much improved, still has some wheezing, using his inhalers.  Diabetes mellitus- blood sugars have been low 100's. Tolerating metformin Hypertension-blood pressure improving since he started his medication, it did well during the hospital visit  Labs reviewed    Review of Systems  GEN- denies fatigue, fever, weight loss,weakness,+ recent illness HEENT- denies eye drainage, change in vision, nasal discharge, CVS- denies chest pain, palpitations RESP- denies SOB, +cough, +wheeze Neuro- denies headache, dizziness, syncope, seizure activity       Objective:   Physical Exam GEN- NAD, alert and oriented x3 HEENT- PERRL, EOMI, non injected sclera, pink conjunctiva, MMM, oropharynx clear Neck- Supple,  CVS- RRR, no murmur RESP-normal WOB,no retractions, +bilateral expiratory wheeze,no rhonchi EXT- No edema Pulses- Radial, DP- 2+        Assessment & Plan:   Vicodin script was discarded at my office

## 2011-12-05 DIAGNOSIS — J449 Chronic obstructive pulmonary disease, unspecified: Secondary | ICD-10-CM | POA: Insufficient documentation

## 2011-12-05 NOTE — Assessment & Plan Note (Signed)
Not at goal but much improved, will have him continue meds, f/u next visit to see if increase in ACEI needed

## 2011-12-05 NOTE — Assessment & Plan Note (Addendum)
Well controlled based on A1C, no change to medication, I wonder if his DM is steroid induced.

## 2011-12-05 NOTE — Assessment & Plan Note (Signed)
Pt was to have antibiotics and prednisone but he does not have this with him, will give prednisone to continue his course from the hospital and Full dose azithromycin

## 2011-12-05 NOTE — Assessment & Plan Note (Signed)
No recent seizure, he is now back on his long term dose, his level was not therapeutic however he is stable, therefore I will continue his current dose

## 2011-12-11 ENCOUNTER — Telehealth: Payer: Self-pay | Admitting: Family Medicine

## 2011-12-11 MED ORDER — HYDROCODONE-ACETAMINOPHEN 7.5-325 MG PO TABS: 1.0000 | ORAL_TABLET | Freq: Four times a day (QID) | ORAL | Status: DC | PRN

## 2011-12-11 NOTE — Telephone Encounter (Addendum)
.  Medication refilled, he has signed pain contract already

## 2011-12-12 NOTE — Telephone Encounter (Signed)
Pt aware.

## 2012-01-09 ENCOUNTER — Other Ambulatory Visit: Payer: Self-pay

## 2012-01-09 MED ORDER — HYDROCODONE-ACETAMINOPHEN 7.5-325 MG PO TABS: 1.0000 | ORAL_TABLET | Freq: Four times a day (QID) | ORAL | Status: DC | PRN

## 2012-02-04 ENCOUNTER — Telehealth: Payer: Self-pay | Admitting: Family Medicine

## 2012-02-04 ENCOUNTER — Ambulatory Visit (INDEPENDENT_AMBULATORY_CARE_PROVIDER_SITE_OTHER): Payer: Medicaid Other | Admitting: Family Medicine

## 2012-02-04 ENCOUNTER — Encounter: Payer: Self-pay | Admitting: Family Medicine

## 2012-02-04 VITALS — BP 150/96 | HR 68 | Resp 16 | Ht 68.0 in | Wt 240.0 lb

## 2012-02-04 DIAGNOSIS — E119 Type 2 diabetes mellitus without complications: Secondary | ICD-10-CM

## 2012-02-04 DIAGNOSIS — J449 Chronic obstructive pulmonary disease, unspecified: Secondary | ICD-10-CM

## 2012-02-04 DIAGNOSIS — G8929 Other chronic pain: Secondary | ICD-10-CM

## 2012-02-04 DIAGNOSIS — J309 Allergic rhinitis, unspecified: Secondary | ICD-10-CM

## 2012-02-04 DIAGNOSIS — Z9109 Other allergy status, other than to drugs and biological substances: Secondary | ICD-10-CM

## 2012-02-04 DIAGNOSIS — I10 Essential (primary) hypertension: Secondary | ICD-10-CM

## 2012-02-04 DIAGNOSIS — Z79899 Other long term (current) drug therapy: Secondary | ICD-10-CM

## 2012-02-04 DIAGNOSIS — I509 Heart failure, unspecified: Secondary | ICD-10-CM

## 2012-02-04 DIAGNOSIS — R109 Unspecified abdominal pain: Secondary | ICD-10-CM

## 2012-02-04 MED ORDER — LISINOPRIL 20 MG PO TABS: 20.0000 mg | ORAL_TABLET | Freq: Every day | ORAL | Status: DC

## 2012-02-04 MED ORDER — LORATADINE 10 MG PO TABS: 10.0000 mg | ORAL_TABLET | Freq: Every day | ORAL | Status: DC

## 2012-02-04 MED ORDER — HYDROCODONE-ACETAMINOPHEN 7.5-325 MG PO TABS: 1.0000 | ORAL_TABLET | Freq: Four times a day (QID) | ORAL | Status: DC | PRN

## 2012-02-04 NOTE — Assessment & Plan Note (Signed)
Given another meter continue Metformin

## 2012-02-04 NOTE — Telephone Encounter (Signed)
Toni Amend spoke with pharmacist

## 2012-02-04 NOTE — Assessment & Plan Note (Signed)
UDS done today, pain meds refilled 

## 2012-02-04 NOTE — Assessment & Plan Note (Signed)
Currently compensated 

## 2012-02-04 NOTE — Assessment & Plan Note (Signed)
Increase lisinopril to 20mg  

## 2012-02-04 NOTE — Patient Instructions (Signed)
I will get a scan of your stomach  Continue your current medications Increase your Lisinopril to 20mg  for blood pressure- you can take 2 tablets , until you run out then get new prescription  Continue your inhalers Check blood sugars once a day - in the morning  F/U 3 months

## 2012-02-04 NOTE — Assessment & Plan Note (Signed)
Currently stable.

## 2012-02-04 NOTE — Assessment & Plan Note (Signed)
S/p multiple surgeries, ventral hernia also seen, will obtain CT abd/pelvis for continued pain, no evidence of acute abdomen

## 2012-02-04 NOTE — Progress Notes (Signed)
  Subjective:    [name], male    DOB: August 25, 1956, 56 y.o.   MRN: [id number]  HPI  Pt here to f/u medications, continues to have abdominal pain on and off in lower quadrants, his bowels have been good, denies dysuria, tolerating by mouth. Out of pain meds. DM- unable to take his blood sugar, lost his meter at the [facility] and history reviewed  Review of Systems     GEN- denies fatigue, fever, weight loss,weakness, recent illness HEENT- denies eye drainage, change in vision, nasal discharge, +nasal congestion, +sneezing CVS-  Denies chest pain, palpitations RESP-denies SOB, cough, wheeze ABD- denies N/V, change in stools, +abd pain GU- denies dysuria, hematuria, dribbling, incontinence MSK- + joint pain,+ muscle aches, injury Neuro- denies headache, deniesdizziness, syncope, seizure activity    Objective:   Physical Exam GEN- NAD, alert and oriented x3 HEENT- PERRL, EOMI, non injected sclera, pink conjunctiva, MMM, oropharynx clear- Neck- Supple,  CVS- RRR, no murmur RESP-few wheeze, good air movement, no rhonchi ABD- NABS,soft, appears distended, TTP lower quadrants, no rebound no guarding, well healed midline surgical scar EXT- No edema Pulses- Radial, DP- 2+, PT 2+       Assessment & Plan:

## 2012-02-05 ENCOUNTER — Other Ambulatory Visit: Payer: Self-pay

## 2012-02-05 LAB — DRUG SCREEN, URINE
Amphetamine Screen, Ur: NEGATIVE
Barbiturate Quant, Ur: NEGATIVE
Benzodiazepines.: NEGATIVE
Cocaine Metabolites: NEGATIVE
Creatinine,U: 243.36 mg/dL
Marijuana Metabolite: NEGATIVE
Methadone: NEGATIVE
Opiates: NEGATIVE
Phencyclidine (PCP): NEGATIVE
Propoxyphene: NEGATIVE

## 2012-02-05 MED ORDER — ACCU-CHEK AVIVA PLUS W/DEVICE KIT: 1.0000 | PACK | Freq: Every day | Status: DC

## 2012-02-05 MED ORDER — GLUCOSE BLOOD VI STRP: ORAL_STRIP | Status: DC

## 2012-02-05 MED ORDER — ACCU-CHEK FASTCLIX LANCETS MISC: 1.0000 | Freq: Every day | Status: DC

## 2012-02-29 ENCOUNTER — Ambulatory Visit: Payer: Medicaid Other | Admitting: Family Medicine

## 2012-03-03 ENCOUNTER — Ambulatory Visit (INDEPENDENT_AMBULATORY_CARE_PROVIDER_SITE_OTHER): Payer: Medicaid Other | Admitting: Family Medicine

## 2012-03-03 ENCOUNTER — Ambulatory Visit: Payer: Medicaid Other | Admitting: Family Medicine

## 2012-03-03 ENCOUNTER — Encounter: Payer: Self-pay | Admitting: Family Medicine

## 2012-03-03 VITALS — BP 170/100 | HR 70 | Resp 16 | Ht 68.0 in | Wt 241.4 lb

## 2012-03-03 DIAGNOSIS — L909 Atrophic disorder of skin, unspecified: Secondary | ICD-10-CM

## 2012-03-03 DIAGNOSIS — I10 Essential (primary) hypertension: Secondary | ICD-10-CM

## 2012-03-03 DIAGNOSIS — L918 Other hypertrophic disorders of the skin: Secondary | ICD-10-CM | POA: Insufficient documentation

## 2012-03-03 DIAGNOSIS — J449 Chronic obstructive pulmonary disease, unspecified: Secondary | ICD-10-CM

## 2012-03-03 MED ORDER — HYDROCODONE-ACETAMINOPHEN 7.5-325 MG PO TABS: 1.0000 | ORAL_TABLET | Freq: Four times a day (QID) | ORAL | Status: DC | PRN

## 2012-03-03 NOTE — Assessment & Plan Note (Signed)
S/p removal, sent for analysis, tolerated procedure well Remove bandage in 24 hours

## 2012-03-03 NOTE — Assessment & Plan Note (Signed)
Currently at baseline, continue current inhalers

## 2012-03-03 NOTE — Patient Instructions (Signed)
Increase your lisinopril to 40mg , take 2 of the 20mg  tablets Continue all other meds Keep the area dry and clean where skin tag was removed Pain meds refilled today Keep previous f/u appt

## 2012-03-03 NOTE — Progress Notes (Signed)
  Subjective:    [name], male    DOB: [birth year], 56 y.o.   MRN: [id number]  HPI  Pt presents for irritated skin tag in his groin, it has been present for years but past 3 weeks has been very sore and he thinks it is enlarging and would like to have it removed. Taking blood pressure medications as directed, his aunt is currently ill in ICU who he is very close too and he thinks this is why his blood pressure is elevated.  Seen in [facility] ED for COPD exacerbation a few weeks ago, doing well now.   Review of Systems - per above    GEN- denies fatigue, fever, weight loss,weakness, recent illness CVS- denies chest pain, palpitations RESP- denies SOB, cough, wheeze ABD- denies N/V, change in stools, abd pain       Objective:   Physical Exam GEN- NAD, alert and oriented x3 , repeat BP 178/100 CVS- RRR, no murmur RESP-few wheeze, good air movement, no rhonchi EXT- No edema Skin- irritated skin tag noted in right groin, no erythema surrounding, no discharge,       Assessment & Plan:   Procedure- Skin Tag Removal Procedure explained to patient questions answered benefits and risks discussed written consent obtained. Antiseptic-Betadine Anesthesia-lidocaine Skin tag removed, right groin, irritated in apperance , penduculated Minimal blood loss Silver nitrate applied for hemostasis, minimal bleeding Patient tolerated procedure well Bandage applied with bacitracin

## 2012-03-03 NOTE — Assessment & Plan Note (Addendum)
Increase lisinopril to 40mg  to maximize, continue exforge BP uncontrolled

## 2012-03-31 ENCOUNTER — Telehealth: Payer: Self-pay | Admitting: Family Medicine

## 2012-04-02 ENCOUNTER — Other Ambulatory Visit: Payer: Self-pay

## 2012-04-02 DIAGNOSIS — Z79899 Other long term (current) drug therapy: Secondary | ICD-10-CM

## 2012-04-02 MED ORDER — HYDROCODONE-ACETAMINOPHEN 7.5-325 MG PO TABS: 1.0000 | ORAL_TABLET | Freq: Four times a day (QID) | ORAL | Status: DC | PRN

## 2012-04-02 NOTE — Telephone Encounter (Signed)
Patient has collected rx

## 2012-04-03 ENCOUNTER — Other Ambulatory Visit: Payer: Self-pay | Admitting: Family Medicine

## 2012-04-04 LAB — DRUG SCREEN, URINE
Amphetamine Screen, Ur: NEGATIVE
Cocaine Metabolites: NEGATIVE
Creatinine,U: 120.57 mg/dL
Opiates: NEGATIVE
Phencyclidine (PCP): NEGATIVE

## 2012-04-14 ENCOUNTER — Other Ambulatory Visit: Payer: Self-pay | Admitting: Family Medicine

## 2012-04-14 ENCOUNTER — Ambulatory Visit (INDEPENDENT_AMBULATORY_CARE_PROVIDER_SITE_OTHER): Payer: Medicaid Other | Admitting: Family Medicine

## 2012-04-14 ENCOUNTER — Encounter: Payer: Self-pay | Admitting: Family Medicine

## 2012-04-14 VITALS — BP 190/100 | HR 95 | Resp 16 | Ht 68.0 in | Wt 237.1 lb

## 2012-04-14 DIAGNOSIS — I10 Essential (primary) hypertension: Secondary | ICD-10-CM

## 2012-04-14 DIAGNOSIS — R51 Headache: Secondary | ICD-10-CM

## 2012-04-14 DIAGNOSIS — G8929 Other chronic pain: Secondary | ICD-10-CM

## 2012-04-14 DIAGNOSIS — R519 Headache, unspecified: Secondary | ICD-10-CM

## 2012-04-14 DIAGNOSIS — Z79899 Other long term (current) drug therapy: Secondary | ICD-10-CM

## 2012-04-14 LAB — CBC
HCT: 42.9 % (ref 39.0–52.0)
Hemoglobin: 14.4 g/dL (ref 13.0–17.0)
MCH: 30.7 pg (ref 26.0–34.0)
MCHC: 33.6 g/dL (ref 30.0–36.0)

## 2012-04-14 MED ORDER — LISINOPRIL 40 MG PO TABS: 40.0000 mg | ORAL_TABLET | Freq: Every day | ORAL | Status: DC

## 2012-04-14 MED ORDER — AMLODIPINE BESYLATE 10 MG PO TABS: 10.0000 mg | ORAL_TABLET | Freq: Every day | ORAL | Status: DC

## 2012-04-14 NOTE — Assessment & Plan Note (Signed)
Uncontrolled, he does not have meds today, I am not sure what he is actually taking, discussed how BP affect other organs importance of good control in lieu of his home remedies, as AZOR can be costly will d/c and have him use lisinopril 40mg  and norvasc 10mg , RTC 2 days for recheck, consider HCTZ or clonidine

## 2012-04-14 NOTE — Progress Notes (Signed)
  Subjective:    [name], male    DOB: [birth year], 56 y.o.   MRN: [id number]  HPI Pt brought in to discuss UDS- he has had 2 negative screens though prescribed narcotic medications, he tells me he uses them everyday for his pain, he has been using vinegar and water to help his blood pressure. HTN- BP has been very elevated, he has HA when BP is elevated, no CP, no SOB, he has been out of Azor for weeks, he admits today, he did not bring his meds   Review of Systems  GEN- denies fatigue, fever, weight loss,weakness, recent illness HEENT- denies eye drainage, change in vision, nasal discharge, CVS- denies chest pain, palpitations RESP- denies SOB, cough, wheeze ABD- denies N/V, change in stools, abd pain GU- denies dysuria, hematuria, dribbling, incontinence MSK- +joint pain, muscle aches, injury Neuro- denies headache, dizziness, syncope, seizure activity       Objective:   Physical Exam GEN- NAD, alert and oriented x3 , repeat BP 190/105 HEENT-PERRL EOMI, MMM CVS- RRR, no murmur RESP-few scattered wheeze, good air movement, no rhonchi, normal WOB ABD-NABS,soft,NT,ND EXT- No edema         Assessment & Plan:

## 2012-04-14 NOTE — Assessment & Plan Note (Signed)
Unchanged, he also complain of shoulder pain present for many months he will have this evaluated at later visit, UDS repeated today., pt states he took pain pill today, he states he did not drink vinegar solution today

## 2012-04-14 NOTE — Patient Instructions (Addendum)
Start the new blood pressure medication -amlodipine and continue lisinopril 40mg  I will recheck you urine Get the labs drawn F/U Thursday for blood pressure

## 2012-04-14 NOTE — Assessment & Plan Note (Signed)
Unchanged, multifactorial, he does not appear to be in any distress

## 2012-04-15 LAB — BASIC METABOLIC PANEL
CO2: 33 mEq/L — ABNORMAL HIGH (ref 19–32)
Chloride: 104 mEq/L (ref 96–112)
Creat: 1.01 mg/dL (ref 0.50–1.35)
Glucose, Bld: 81 mg/dL (ref 70–99)
Sodium: 145 mEq/L (ref 135–145)

## 2012-04-16 ENCOUNTER — Ambulatory Visit (INDEPENDENT_AMBULATORY_CARE_PROVIDER_SITE_OTHER): Payer: Medicaid Other | Admitting: Family Medicine

## 2012-04-16 ENCOUNTER — Ambulatory Visit (HOSPITAL_COMMUNITY)
Admission: RE | Admit: 2012-04-16 | Discharge: 2012-04-16 | Disposition: A | Discharge status: Home / Self Care | Payer: Medicaid Other | Source: Ambulatory Visit | Attending: Family Medicine | Admitting: Family Medicine

## 2012-04-16 ENCOUNTER — Encounter: Payer: Self-pay | Admitting: Family Medicine

## 2012-04-16 VITALS — BP 160/98 | HR 75 | Resp 16 | Ht 68.0 in | Wt 240.0 lb

## 2012-04-16 DIAGNOSIS — I10 Essential (primary) hypertension: Secondary | ICD-10-CM

## 2012-04-16 DIAGNOSIS — M25519 Pain in unspecified shoulder: Secondary | ICD-10-CM

## 2012-04-16 DIAGNOSIS — M25512 Pain in left shoulder: Secondary | ICD-10-CM

## 2012-04-16 LAB — HEMOGLOBIN A1C: Mean Plasma Glucose: 105 mg/dL (ref <117)

## 2012-04-16 MED ORDER — HYDROCHLOROTHIAZIDE 25 MG PO TABS: 25.0000 mg | ORAL_TABLET | Freq: Every day | ORAL | Status: DC

## 2012-04-16 NOTE — Progress Notes (Signed)
  Subjective:    [name], male    DOB: 10/08/1955, 56 y.o.   MRN: [id number]  HPI Patient presents to followup blood pressure. His medications were changed lisinopril 40 mg and amlodipine 10 mg. Today he tells me that he took a total of 80 mg of lisinopril and 10 mg of amlodipine about an hour before our visit. His blood pressure is still elevated. Labs reviewed, he will need A1c to be added on.  Urine drug screen Pending Shoulder pain-per previous visit he has had left shoulder pain for the past 6 weeks. 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 He denies any weakness in his arms he denies any tingling or numbness sensation in his hands or fingertips.    Review of Systems  GEN- denies fatigue, fever, weight loss,weakness, recent illness HEENT- denies eye drainage, change in vision, nasal discharge, CVS- denies chest pain, palpitations RESP- denies SOB, cough, wheeze ABD- denies N/V, change in stools, abd pain GU- denies dysuria, hematuria, dribbling, incontinence MSK- + joint pain, muscle aches, injury Neuro- denies headache, dizziness, syncope, seizure activity      Objective:   Physical Exam GEN- NAD, alert and oriented x3 , repeat BP Right 168/100 , Left 164/96  CVS- RRR, no murmur RESP-CTAB EXT- No edema MSK-normal inspection, +empty can on left side, biceps in tact, strength equal bilat, decreased ROM left shoulder secondary to pain  Neuro- sensation in tact  Pulse- Radial 2+      Assessment & Plan:

## 2012-04-16 NOTE — Patient Instructions (Signed)
Lisinopril take 2 of the 20mg  tablets- until they run out --- When these run out then take the 40mg  lisinopril once a day  Take amlodipine 10mg  once a day Hydrochlorothiazide (HCTZ) 25mg  once a day  Keep previous follow-up appointment

## 2012-04-16 NOTE — Assessment & Plan Note (Signed)
Continues to have pain 6 weeks out, likley strain, he may have some OA, will send for xray and refer to orthopedics, he is on pain contract

## 2012-04-16 NOTE — Assessment & Plan Note (Signed)
Improved but still uncontrolled, will add HCTZ 25mg , continue norvasc 10mg , lisinopril 40mg , he did not bring his bottles in therefore I can not verify exactly what he is taking

## 2012-04-17 ENCOUNTER — Ambulatory Visit: Payer: Medicaid Other | Admitting: Family Medicine

## 2012-04-17 LAB — DRUG SCREEN, URINE
Marijuana Metabolite: NEGATIVE
Methadone: NEGATIVE
Opiates: POSITIVE — AB
Propoxyphene: NEGATIVE

## 2012-04-28 ENCOUNTER — Other Ambulatory Visit: Payer: Self-pay

## 2012-04-28 MED ORDER — HYDROCODONE-ACETAMINOPHEN 7.5-325 MG PO TABS: 1.0000 | ORAL_TABLET | Freq: Four times a day (QID) | ORAL | Status: DC | PRN

## 2012-05-06 ENCOUNTER — Ambulatory Visit (INDEPENDENT_AMBULATORY_CARE_PROVIDER_SITE_OTHER): Payer: Medicaid Other | Admitting: Family Medicine

## 2012-05-06 ENCOUNTER — Telehealth: Payer: Self-pay | Admitting: Family Medicine

## 2012-05-06 ENCOUNTER — Encounter: Payer: Self-pay | Admitting: Family Medicine

## 2012-05-06 VITALS — BP 156/84 | HR 88 | Resp 18 | Ht 68.0 in | Wt 236.0 lb

## 2012-05-06 DIAGNOSIS — E119 Type 2 diabetes mellitus without complications: Secondary | ICD-10-CM

## 2012-05-06 DIAGNOSIS — I10 Essential (primary) hypertension: Secondary | ICD-10-CM

## 2012-05-06 NOTE — Assessment & Plan Note (Signed)
Bp improved no change to meds

## 2012-05-06 NOTE — Telephone Encounter (Signed)
Returned pharmacies phone call and clarified order.

## 2012-05-06 NOTE — Assessment & Plan Note (Signed)
D/c Metformin, A1C has been consistenly less than 6%

## 2012-05-06 NOTE — Progress Notes (Signed)
  Subjective:    [name], male    DOB: Nov 04, 1955, 56 y.o.   MRN: [id number]  HPI Pt here to f/u Labs and blood pressure, doing well no concerns No meds with him today Taking HTCZ, lisinopril and Toprol Ortho appt pending for shoulder   Review of Systems - per above     Objective:   Physical Exam GEN-NAD, alert and oriented x 3        Assessment & Plan:

## 2012-05-06 NOTE — Patient Instructions (Signed)
Stop the Metformin  Continue all the other medications for your blood pressure  Call in 1 week for your orthopedic appt if you have not heard  F/U 3 months

## 2012-05-22 ENCOUNTER — Ambulatory Visit (INDEPENDENT_AMBULATORY_CARE_PROVIDER_SITE_OTHER): Payer: Medicaid Other | Admitting: Orthopedic Surgery

## 2012-05-22 ENCOUNTER — Encounter: Payer: Self-pay | Admitting: Orthopedic Surgery

## 2012-05-22 VITALS — BP 150/100 | Ht 68.0 in | Wt 236.0 lb

## 2012-05-22 DIAGNOSIS — M719 Bursopathy, unspecified: Secondary | ICD-10-CM

## 2012-05-22 DIAGNOSIS — S43429A Sprain of unspecified rotator cuff capsule, initial encounter: Secondary | ICD-10-CM

## 2012-05-22 DIAGNOSIS — M75102 Unspecified rotator cuff tear or rupture of left shoulder, not specified as traumatic: Secondary | ICD-10-CM | POA: Insufficient documentation

## 2012-05-22 MED ORDER — HYDROCODONE-ACETAMINOPHEN 7.5-325 MG PO TABS: 1.0000 | ORAL_TABLET | Freq: Four times a day (QID) | ORAL | Status: DC | PRN

## 2012-05-22 NOTE — Progress Notes (Signed)
[name], male   DOB: 28-Jul-1956, 56 y.o.   MRN: [id number] Chief Complaint  Patient presents with  . Shoulder Pain    left shoulder pain x 3 months, sudden onset, no known injury    This 56 year old male presents as a new patient with a three-month history of pain in his left shoulder which started 3 months ago when he picked up a heavy garbage can. Now he has throbbing 8/10 intermittent pain which is positional. He cannot lie on the shoulder he cannot sleep at night he has mild weakness when he is holding things away from his body in his hand  He has significant medical history. His review of systems is noted for the following  Chest pain shortness of breath wheezing, diarrhea dysuria numbness. The remaining systems were reviewed and were normal  Past Medical History  Diagnosis Date  . Diabetes mellitus   . Hypertension   . Asthma   . COPD (chronic obstructive pulmonary disease)   . Hyperlipidemia   . Headache   . BPH (benign prostatic hyperplasia)   . Diastolic CHF   . Substance abuse     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 Cocaine   Past Surgical History  Procedure Date  . Brain surgery   . Ventriculoperitoneal shunt   . Colon surgery 06/2011    Diverticulitis Complicated  . Mandible surgery     Physical Exam  Constitutional: He is oriented to person, place, and time. He appears well-developed and well-nourished. No distress.  HENT:  Head: Atraumatic.       Scar from shunt  Eyes: Pupils are equal, round, and reactive to light. Right eye exhibits no discharge. Left eye exhibits no discharge.  Neck: Normal range of motion. Neck supple. No JVD present. No tracheal deviation present.  Cardiovascular: Intact distal pulses.   Pulmonary/Chest: Effort normal. No stridor. No respiratory distress. He exhibits no tenderness.  Abdominal: Soft. He exhibits no distension.  Musculoskeletal:       Right shoulder: Normal.       Right hip: Normal.       Left hip: Normal.   The right shoulder has normal range of motion and strength. Stability test normal. Inspection was normal.  The lower extremities have no evidence of contracture subluxation atrophy or tremor no swelling.  Lymphadenopathy:    He has no cervical adenopathy.  Neurological: He is alert and oriented to person, place, and time. He has normal reflexes. He exhibits normal muscle tone.  Skin: Skin is warm and dry. No rash noted. He is not diaphoretic. No erythema. No pallor.  Psychiatric: He has a normal mood and affect. His behavior is normal. Judgment and thought content normal.  Left Shoulder Exam   Tenderness  The patient is experiencing tenderness in the acromion.  Range of Motion  Active Abduction: abnormal  Passive Abduction: normal  Extension: normal  Forward Flexion: abnormal  External Rotation: normal  Internal Rotation 0 degrees: Lumbar   Muscle Strength  Abduction: 4/5  Internal Rotation: 5/5  External Rotation: 5/5  Supraspinatus: 4/5  Subscapularis: 5/5  Biceps: 5/5   Tests  Apprehension: positive Cross Arm: negative Drop Arm: negative Impingement: positive Sulcus: absent  Other  Erythema: absent Scars: absent Sensation: normal Pulse: present      X-rays are noted from 04/16/2012 there are no radiographic abnormalities of the left shoulder  Impression differential diagnosis is rotator cuff syndrome versus partial rotator cuff tear  Recommend subacromial injection, physical therapy. He can  take hydrocodone 7.5 mg for pain  Return after therapy and one month assess need for MRI

## 2012-05-22 NOTE — Patient Instructions (Addendum)
SCHEDULE OT AT [facility]  You have received a steroid shot. 15% of patients experience increased pain at the injection site with in the next 24 hours. This is best treated with ice and tylenol extra strength 2 tabs every 8 hours. If you are still having pain please call the office.   Surgery for Rotator Cuff Tear with Rehab Rotator cuff surgery is only recommended for individuals who have experienced persistent disability for greater than 3 months of non-surgical (conservative) treatment. Surgery is not necessary but is recommended for individuals who experience difficulty completing daily activities or athletes who are unable to compete. Rotator cuff tears do not usually heal without surgical intervention. If left alone small rotator cuff tears usually become larger. Younger athletes who have a rotator cuff tear may be recommended for surgery without attempting conservative rehabilitation. The purpose of surgery is to regain function of the shoulder joint and eliminate pain associated with the injury. In addition to repairing the tendon tear, the surgery often removes a portion of the bony roof of the shoulder (acromion) as well as the chronically thickened and inflamed membrane below the acromion (subacromial bursa). REASONS NOT TO OPERATE    Infection of the shoulder.   Inability to complete a rehabilitation program.   Patients who have other conditions (emotional or psychological) conditions that contribute to their shoulder condition.  RISKS AND COMPLICATIONS  Infection.   Re-tear of the rotator cuff tendons or muscles.   Shoulder stiffness and/or weakness.   Inability to compete in athletics.   Acromioclavicular (AC) joint paint.   Risks of surgery: infection, bleeding, nerve damage, or damage to surrounding tissues.  TECHNIQUE There are different surgical procedures used to treat rotator cuff tears. The type of procedure depends on the extent of injury as well as the surgeon's  preference. All of the surgical techniques for rotator cuff tears have the same goal of repairing the torn tendon, removing part of the acromion, and removing the subacromial bursa. There are two main types of procedures: arthroscopic and open incision. Arthroscopic procedures are usually completed and you go home the same day as surgery (outpatient). These procedures use multiple small incisions in which tools and a video camera are placed to work on the shoulder. An electric shaver removes the bursa, then a power burr shaves down the portion of the acromion that places pressure on the rotator cuff. Finally the rotator cuff is sewed (sutured) back to the humeral head. Open incision procedures require a larger incision. The deltoid muscle is detached from the acromion and a ligament in the shoulder (coracoacromial) is cut in order for the surgeon to access the rotator cuff. The subacromial bursa is removed as well as part of the acromion to give the rotator cuff room to move freely. The torn tendon is then sutured to the humeral head. After the rotator cuff is repaired, then the deltoid is reattached and the incision is closed up.   RECOVERY    Post-operative care depends on the surgical technique and the preferences of your therapist.   Keep the wound clean and dry for the first 10 to 14 days after surgery.   Keep your shoulder and arm in the sling provided to you for as long as you have been instructed to.   You will be given pain medications by your caregiver.   Passive (without using muscles) shoulder movements may be begun immediately after surgery.   It is important to follow through with you rehabilitation program in order  to have the best possible recovery.  RETURN TO SPORTS    The rehabilitation period will depend on the sport and position you play as well as the success of the operation.   The minimum recovery period is 6 months.   You must have regained complete shoulder motion and  strength before returning to sports.  SEEK IMMEDIATE MEDICAL CARE IF:    Any medications produce adverse side effects.   Any complications from surgery occur:   Pain, numbness, or coldness in the extremity operated upon.   Discoloration of the nail beds (they become blue or gray) of the extremity operated upon.   Signs of infections (fever, pain, inflammation, redness, or persistent bleeding).  EXERCISES   RANGE OF MOTION (ROM) AND STRETCHING EXERCISES - Rotator Cuff Tear, Surgery For These exercises may help you restore your elbow mobility once your physician has discontinued your immobilization period. Beginning these before your provider's approval may result in delayed healing. Your symptoms may resolve with or without further involvement from your physician, physical therapist or athletic trainer. While completing these exercises, remember:    Restoring tissue flexibility helps normal motion to return to the joints. This allows healthier, less painful movement and activity.   An effective stretch should be held for at least 30 seconds. A stretch should never be painful. You should only feel a gentle lengthening or release in the stretch.  ROM - Pendulum   Bend at the waist so that your right / left arm falls away from your body. Support yourself with your opposite hand on a solid surface, such as a table or a countertop.   Your right / left arm should be perpendicular to the ground. If it is not perpendicular, you need to lean over farther. Relax the muscles in your right / left arm and shoulder as much as possible.   Gently sway your hips and trunk so they move your right / left arm without any use of your right / left shoulder muscles.   Progress your movements so that your right / left arm moves side to side, then forward and backward, and finally, both clockwise and counterclockwise.   Complete __________ repetitions in each direction. Many people use this exercise to relieve  discomfort in their shoulder as well as to gain range of motion.  Repeat __________ times. Complete this exercise __________ times per day. STRETCH - Flexion, Seated   Sit in a firm chair so that your right / left forearm can rest on a table or on a table or countertop. Your right / left elbow should rest below the height of your shoulder so that your shoulder feels supported and not tense or uncomfortable.   Keeping your right / left shoulder relaxed, lean forward at your waist, allowing your right / left hand to slide forward. Bend forward until you feel a moderate stretch in your shoulder, but before you feel an increase in your pain.   Hold __________ seconds. Slowly return to your starting position.  Repeat __________ times. Complete this exercise __________ times per day.   STRETCH - Flexion, Standing   Stand with good posture. With an underhand grip on your right / left and an overhand grip on the opposite hand, grasp a broomstick or cane so that your hands are a little more than shoulder-width apart.   Keeping your right / left elbow straight and shoulder muscles relaxed, push the stick with your opposite hand to raise your right / left arm in  front of your body and then overhead. Raise your arm until you feel a stretch in your right / left shoulder, but before you have increased shoulder pain.   Try to avoid shrugging your right / left shoulder as your arm rises by keeping your shoulder blade tucked down and toward your mid-back spine. Hold __________ seconds.   Slowly return to the starting position.  Repeat __________ times. Complete this exercise __________ times per day.   STRETCH - Abduction, Supine   Stand with good posture. With an underhand grip on your right / left and an overhand grip on the opposite hand, grasp a broomstick or cane so that your hands are a little more than shoulder-width apart.   Keeping your right / left elbow straight and shoulder muscles relaxed, push  the stick with your opposite hand to raise your right / left arm out to the side of your body and then overhead. Raise your arm until you feel a stretch in your right / left shoulder, but before you have increased shoulder pain.   Try to avoid shrugging your right / left shoulder as your arm rises by keeping your shoulder blade tucked down and toward your mid-back spine. Hold __________ seconds.   Slowly return to the starting position.  Repeat __________ times. Complete this exercise __________ times per day.   ROM - Flexion, Active-Assisted  Lie on your back. You may bend your knees for comfort.   Grasp a broomstick or cane so your hands are about shoulder-width apart. Your right / left hand should grip the end of the stick/cane so that your hand is positioned "thumbs-up," as if you were about to shake hands.   Using your healthy arm to lead, raise your right / left arm overhead until you feel a gentle stretch in your shoulder. Hold __________ seconds.   Use the stick/cane to assist in returning your right / left arm to its starting position.  Repeat __________ times. Complete this exercise __________ times per day.   STRETCH - External Rotation   Tuck a folded towel or small ball under your right / left upper arm. Grasp a broomstick or cane with an underhand grasp a little more than shoulder width apart. Bend your elbows to 90 degrees.   Stand with good posture or sit in a chair without arms.   Use your strong arm to push the stick across your body. Do not allow the towel or ball to fall. This will rotate your right / left arm away from your abdomen. Using the stick turn/rotate your hand and forearm away from your body. Hold __________ seconds.  Repeat __________ times. Complete this exercise __________ times per day.   STRENGTHENING EXERCISES - Rotator Cuff Tear, Surgery For These exercises may help you begin to restore your elbow strength in the initial stage of your rehabilitation.  Your physician will determine when you begin these exercises depending on the severity of your injury and the integrity of your repaired tissues. Beginning these before your provider's approval may result in delayed healing. While completing these exercises, remember:    Muscles can gain both the endurance and the strength needed for everyday activities through controlled exercises.   Complete these exercises as instructed by your physician, physical therapist or athletic trainer. Progress the resistance and repetitions only as guided.   You may experience muscle soreness or fatigue, but the pain or discomfort you are trying to eliminate should never worsen during these exercises. If this pain  does worsen, stop and make certain you are following the directions exactly. If the pain is still present after adjustments, discontinue the exercise until you can discuss the trouble with your clinician.  STRENGTH - Shoulder Flexion, Isometric   With good posture and facing a wall, stand or sit about 4-6 inches away.   Keeping your right / left elbow straight, gently press the top of your fist into the wall. Increase the pressure gradually until you are pressing as hard as you can without shrugging your shoulder or increasing any shoulder discomfort.   Hold __________ seconds. Release the tension slowly. Relax your shoulder muscles completely before you do the next repetition.  Repeat __________ times. Complete this exercise __________ times per day.   STRENGTH - Shoulder Abductors, Isometric   With good posture, stand or sit about 4-6 inches from a wall with your right / left side facing the wall.   Bend your right / left elbow. Gently press your right / left elbow into the wall. Increase the pressure gradually until you are pressing as hard as you can without shrugging your shoulder or increasing any shoulder discomfort.   Hold __________ seconds.   Release the tension slowly. Relax your shoulder  muscles completely before you do the next repetition.  Repeat __________ times. Complete this exercise __________ times per day.   STRENGTH - Internal Rotators, Isometric   Keep your right / left elbow at your side and bend it 90 degrees.   Step into a door frame so that the inside of your right / left wrist can press against the door frame without your upper arm leaving your side.   Gently press your right / left wrist into the door frame as if you were trying to draw the palm of your hand to your abdomen. Gradually increase the tension until you are pressing as hard as you can without shrugging your shoulder or increasing any shoulder discomfort.   Hold __________ seconds.   Release the tension slowly. Relax your shoulder muscles completely before you do the next repetition.  Repeat __________ times. Complete this exercise __________ times per day.   STRENGTH - External Rotators, Isometric   Keep your right / left elbow at your side and bend it 90 degrees.   Step into a door frame so that the outside of your right / left wrist can press against the door frame without your upper arm leaving your side.   Gently press your right / left wrist into the door frame as if you were trying to swing the back of your hand away from your abdomen. Gradually increase the tension until you are pressing as hard as you can without shrugging your shoulder or increasing any shoulder discomfort.   Hold __________ seconds.   Release the tension slowly. Relax your shoulder muscles completely before you do the next repetition.  Repeat __________ times. Complete this exercise __________ times per day.   Document Released: 09/10/2005 Document Revised: 08/30/2011 Document Reviewed: 12/23/2008 [facility] Patient Information 2012 [place], [place].   Subacromial Shoulder Injection Procedure Note  Pre-operative Diagnosis: left RC Syndrome  Post-operative Diagnosis: same  Indications: pain   Anesthesia: ethyl  chloride   Procedure Details   Verbal consent was obtained for the procedure. The shoulder was prepped withalcohol and the skin was anesthetized. A 20 gauge needle was advanced into the subacromial space through posterior approach without difficulty  The space was then injected with 3 ml 1% lidocaine and 1 ml of depomedrol.  The injection site was cleansed with isopropyl alcohol and a dressing was applied.  Complications:  None; patient tolerated the procedure well.

## 2012-05-30 ENCOUNTER — Ambulatory Visit (HOSPITAL_COMMUNITY): Payer: Medicaid Other | Admitting: Specialist

## 2012-06-07 DIAGNOSIS — R079 Chest pain, unspecified: Secondary | ICD-10-CM

## 2012-06-19 ENCOUNTER — Ambulatory Visit (INDEPENDENT_AMBULATORY_CARE_PROVIDER_SITE_OTHER): Payer: Medicaid Other | Admitting: Orthopedic Surgery

## 2012-06-19 ENCOUNTER — Encounter: Payer: Self-pay | Admitting: Orthopedic Surgery

## 2012-06-19 VITALS — BP 140/92 | Ht 68.0 in | Wt 236.0 lb

## 2012-06-19 DIAGNOSIS — S43429A Sprain of unspecified rotator cuff capsule, initial encounter: Secondary | ICD-10-CM

## 2012-06-19 DIAGNOSIS — M75102 Unspecified rotator cuff tear or rupture of left shoulder, not specified as traumatic: Secondary | ICD-10-CM

## 2012-06-19 MED ORDER — HYDROCODONE-ACETAMINOPHEN 7.5-325 MG PO TABS: 1.0000 | ORAL_TABLET | Freq: Four times a day (QID) | ORAL | Status: DC | PRN

## 2012-06-19 NOTE — Patient Instructions (Addendum)
2 weeks before return to [place] call us [phone] so we can schedule the MRI.   You have received a steroid shot. 15% of patients experience increased pain at the injection site with in the next 24 hours. This is best treated with ice and tylenol extra strength 2 tabs every 8 hours. If you are still having pain please call the office.   Surgery for Rotator Cuff Tear with Rehab Rotator cuff surgery is only recommended for individuals who have experienced persistent disability for greater than 3 months of non-surgical (conservative) treatment. Surgery is not necessary but is recommended for individuals who experience difficulty completing daily activities or athletes who are unable to compete. Rotator cuff tears do not usually heal without surgical intervention. If left alone small rotator cuff tears usually become larger. Younger athletes who have a rotator cuff tear may be recommended for surgery without attempting conservative rehabilitation. The purpose of surgery is to regain function of the shoulder joint and eliminate pain associated with the injury. In addition to repairing the tendon tear, the surgery often removes a portion of the bony roof of the shoulder (acromion) as well as the chronically thickened and inflamed membrane below the acromion (subacromial bursa). REASONS NOT TO OPERATE    Infection of the shoulder.   Inability to complete a rehabilitation program.   Patients who have other conditions (emotional or psychological) conditions that contribute to their shoulder condition.  RISKS AND COMPLICATIONS  Infection.   Re-tear of the rotator cuff tendons or muscles.   Shoulder stiffness and/or weakness.   Inability to compete in athletics.   Acromioclavicular (AC) joint paint.   Risks of surgery: infection, bleeding, nerve damage, or damage to surrounding tissues.  TECHNIQUE There are different surgical procedures used to treat rotator cuff tears. The type of procedure depends on  the extent of injury as well as the surgeon's preference. All of the surgical techniques for rotator cuff tears have the same goal of repairing the torn tendon, removing part of the acromion, and removing the subacromial bursa. There are two main types of procedures: arthroscopic and open incision. Arthroscopic procedures are usually completed and you go home the same day as surgery (outpatient). These procedures use multiple small incisions in which tools and a video camera are placed to work on the shoulder. An electric shaver removes the bursa, then a power burr shaves down the portion of the acromion that places pressure on the rotator cuff. Finally the rotator cuff is sewed (sutured) back to the humeral head. Open incision procedures require a larger incision. The deltoid muscle is detached from the acromion and a ligament in the shoulder (coracoacromial) is cut in order for the surgeon to access the rotator cuff. The subacromial bursa is removed as well as part of the acromion to give the rotator cuff room to move freely. The torn tendon is then sutured to the humeral head. After the rotator cuff is repaired, then the deltoid is reattached and the incision is closed up.   RECOVERY    Post-operative care depends on the surgical technique and the preferences of your therapist.   Keep the wound clean and dry for the first 10 to 14 days after surgery.   Keep your shoulder and arm in the sling provided to you for as long as you have been instructed to.   You will be given pain medications by your caregiver.   Passive (without using muscles) shoulder movements may be begun immediately after surgery.  It is important to follow through with you rehabilitation program in order to have the best possible recovery.  RETURN TO SPORTS    The rehabilitation period will depend on the sport and position you play as well as the success of the operation.   The minimum recovery period is 6 months.   You  must have regained complete shoulder motion and strength before returning to sports.  SEEK IMMEDIATE MEDICAL CARE IF:    Any medications produce adverse side effects.   Any complications from surgery occur:   Pain, numbness, or coldness in the extremity operated upon.   Discoloration of the nail beds (they become blue or gray) of the extremity operated upon.   Signs of infections (fever, pain, inflammation, redness, or persistent bleeding).    You have been scheduled for rotator cuff surgery.  All surgeries carry some risk.  Remember you always have the option of continued nonsurgical treatment. However in this situation the risks vs. the benefits favor surgery as the best treatment option. The risks of the surgery includes the following but is not limited to bleeding, infection, pulmonary embolus, death from anesthesia, nerve injury vascular injury or need for further surgery, continued pain.  Specific to this procedure the following risks and complications are rare but possible Stiffness, pain, weakness, re tear.  I expect 9-12 months to fully recover from this procedure.

## 2012-06-19 NOTE — Progress Notes (Signed)
Subjective:     [name], male   DOB: August 06, 1956, 56 y.o.   MRN: [id number]  Chief Complaint  Patient presents with  . Follow-up    4 week recheck on left shoulder.    HPI Severe pain, LEFT shoulder he probably has a torn LEFT rotator cuff. His mom has a hip fracture. He is to go to [place]. He was like another injection if possible.  We will schedule him for an MRI after he calls is 2 weeks from the time. He returns from [place].  He notes that he can't sleep on his LEFT shoulder has weakness and pain radiating down his arm into his elbow with occasional numbness and tingling in the LEFT and   Review of SystemsNeurology this tingling and numbness in the LEFT hand. Occasional     Objective:   Physical Exam BP 140/92  Ht 5\' 8"  (1.727 m)  Wt 236 lb (107.049 kg)  BMI 35.88 kg/m2 Well-developed well-nourished, slightly overweight with high BMI Awake, alert, and oriented x3, mood and affect. Normal. Painful range of motion LEFT shoulder weakness, painful for elevation. No instability. Her deformity. Normal pulses normal. Soft touch    Assessment:     Torn LEFT rotator cuff  Subacromial Shoulder Injection Procedure Note  Pre-operative Diagnosis: left RC Syndrome  Post-operative Diagnosis: same  Indications: pain   Anesthesia: ethyl chloride   Procedure Details   Verbal consent was obtained for the procedure. The shoulder was prepped withalcohol and the skin was anesthetized. A 20 gauge needle was advanced into the subacromial space through posterior approach without difficulty  The space was then injected with 3 ml 1% lidocaine and 1 ml of depomedrol. The injection site was cleansed with isopropyl alcohol and a dressing was applied.  Complications:  None; patient tolerated the procedure well.     Plan:     Injection was performed, the patient will call us 2 weeks before he returns from [place] so that we can schedule the MRI of the  shoulder preparation for surgery  norco 7.5 # 120

## 2012-06-25 ENCOUNTER — Telehealth: Payer: Self-pay | Admitting: Orthopedic Surgery

## 2012-06-25 NOTE — Telephone Encounter (Signed)
Patient called to relay that he is still in [place], however, due to continuing shoulder and rotator cuff pain, would like to proceed with having MRI scheduled.  He is aware that it needs to be pre-authorized by insurance (Medicaid).  States he would need 1 week notice in order to get back from [place].  Said if the MRI is approved, he would like to be contacted before the MRI appointment is made so that he can arrange to make it back here to [place].  His ph# is [phone].

## 2012-06-26 ENCOUNTER — Other Ambulatory Visit: Payer: Self-pay | Admitting: Radiology

## 2012-06-26 DIAGNOSIS — M75100 Unspecified rotator cuff tear or rupture of unspecified shoulder, not specified as traumatic: Secondary | ICD-10-CM

## 2012-07-09 ENCOUNTER — Telehealth: Payer: Self-pay | Admitting: Radiology

## 2012-07-09 NOTE — Telephone Encounter (Signed)
Patient has an MRI appointment at [facility] on 07-16-12 at 10:45. Patient has Medicaid, authorization # [id number] and it expires on 07-25-12. Patient will follow up back here in the office.

## 2012-07-16 ENCOUNTER — Ambulatory Visit (HOSPITAL_COMMUNITY)
Admission: RE | Admit: 2012-07-16 | Discharge: 2012-07-16 | Disposition: A | Discharge status: Home / Self Care | Payer: Medicaid Other | Source: Ambulatory Visit | Attending: Orthopedic Surgery | Admitting: Orthopedic Surgery

## 2012-07-16 ENCOUNTER — Other Ambulatory Visit: Payer: Self-pay | Admitting: Orthopedic Surgery

## 2012-07-16 ENCOUNTER — Telehealth: Payer: Self-pay | Admitting: Orthopedic Surgery

## 2012-07-16 DIAGNOSIS — M75100 Unspecified rotator cuff tear or rupture of unspecified shoulder, not specified as traumatic: Secondary | ICD-10-CM

## 2012-07-16 NOTE — Telephone Encounter (Signed)
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  He said the people in Radiology  were not sure if it is metal or not so they are going to call his Neurologist to find out.  He said they were hoping to reschedule for this coming Friday

## 2012-07-16 NOTE — Telephone Encounter (Signed)
He got 120 tablets on 9-26 so  No refills

## 2012-07-17 ENCOUNTER — Telehealth: Payer: Self-pay | Admitting: Orthopedic Surgery

## 2012-07-17 NOTE — Telephone Encounter (Signed)
I called and left voice message for the medical records company with all available information, which does not include the site or physician's office where records came from.  No return call as of yet.  I called back to patient, and re-checked with him as to whether or not he has a card or any identifying numbers for the device.  He will continue to look, and will call Dr. Adaline Sill office directly.  He is also going to complete an authorization for release of records from our office as soon as possible.

## 2012-07-17 NOTE — Telephone Encounter (Signed)
pursue

## 2012-07-17 NOTE — Telephone Encounter (Signed)
Received call from Chauncy Passy [facility], direct ph# [phone], with information related to scheduled MRI that the radiology department was just made aware of.  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  He has found that patient's current neurologist, Dr. Carleene Overlie of [facility] most likely can do the "turn-off" of the device; however, Vincenza Hews states that it is necessary to obtain the name brand and model# of the shunt.  He was able to get the following ph# to request this information, once the appropriate authorization from patient is received:  Ph # is [phone], [employer] is a medical records storage company.  I called this phone #, and was directed to the personal medical records area, separate phone # [phone], although we do not have (nor does the patient) have the name of the closed facility where the records originated.  Presently, [facility] has re-scheduled the MRI for 07/25/12, as long as this information is received.  Please advise if recommended that we pursue, or if you wish to order a diagnostic test other than an MRI.

## 2012-07-22 ENCOUNTER — Ambulatory Visit: Payer: Medicaid Other | Admitting: Orthopedic Surgery

## 2012-07-24 ENCOUNTER — Other Ambulatory Visit (HOSPITAL_COMMUNITY): Payer: Medicaid Other

## 2012-07-24 NOTE — Telephone Encounter (Signed)
Have received call back (following inquiry) from [employer]; per Darl Pikes, based on the information we had provided, unable to locate records.  States they need the facility name.  Have called back to patient - patient provided name of neuro -- Dr. Carleene Overlie, [facility], ph [phone].  Aware that he is to sign a medical records release in order for this information to be requested from this office, if this office has these specific records.

## 2012-07-25 ENCOUNTER — Ambulatory Visit (HOSPITAL_COMMUNITY): Admission: RE | Admit: 2012-07-25 | Payer: Medicaid Other | Source: Ambulatory Visit

## 2012-07-28 ENCOUNTER — Telehealth: Payer: Self-pay | Admitting: Orthopedic Surgery

## 2012-07-28 NOTE — Telephone Encounter (Addendum)
Patient provided name and contact information of neurologist -- Dr. Carleene Overlie, [facility], ph [phone], fax [phone], with medical records release signed by patient in order for the information to be requested from this office, regarding shunt in brain, if this office has these specific records. Fax sent.

## 2012-08-05 ENCOUNTER — Encounter: Payer: Self-pay | Admitting: Family Medicine

## 2012-08-05 ENCOUNTER — Ambulatory Visit (INDEPENDENT_AMBULATORY_CARE_PROVIDER_SITE_OTHER): Payer: Medicaid Other | Admitting: Family Medicine

## 2012-08-05 VITALS — BP 150/94 | HR 74 | Resp 15 | Ht 68.0 in | Wt 236.0 lb

## 2012-08-05 DIAGNOSIS — M75102 Unspecified rotator cuff tear or rupture of left shoulder, not specified as traumatic: Secondary | ICD-10-CM

## 2012-08-05 DIAGNOSIS — G8929 Other chronic pain: Secondary | ICD-10-CM

## 2012-08-05 DIAGNOSIS — I10 Essential (primary) hypertension: Secondary | ICD-10-CM

## 2012-08-05 DIAGNOSIS — J441 Chronic obstructive pulmonary disease with (acute) exacerbation: Secondary | ICD-10-CM

## 2012-08-05 DIAGNOSIS — E119 Type 2 diabetes mellitus without complications: Secondary | ICD-10-CM

## 2012-08-05 MED ORDER — CLONIDINE HCL 0.2 MG PO TABS: 0.2000 mg | ORAL_TABLET | Freq: Two times a day (BID) | ORAL | Status: DC

## 2012-08-05 MED ORDER — LISINOPRIL 40 MG PO TABS: 40.0000 mg | ORAL_TABLET | Freq: Every day | ORAL | Status: DC

## 2012-08-05 MED ORDER — PREDNISONE 10 MG PO TABS: ORAL_TABLET | ORAL | Status: DC

## 2012-08-05 MED ORDER — HYDROCHLOROTHIAZIDE 25 MG PO TABS: 25.0000 mg | ORAL_TABLET | Freq: Every day | ORAL | Status: DC

## 2012-08-05 MED ORDER — AMLODIPINE BESYLATE 10 MG PO TABS: 10.0000 mg | ORAL_TABLET | Freq: Every day | ORAL | Status: DC

## 2012-08-05 MED ORDER — HYDROCODONE-ACETAMINOPHEN 7.5-325 MG PO TABS: 1.0000 | ORAL_TABLET | Freq: Four times a day (QID) | ORAL | Status: DC | PRN

## 2012-08-05 MED ORDER — DOXYCYCLINE HYCLATE 100 MG PO TABS: 100.0000 mg | ORAL_TABLET | Freq: Two times a day (BID) | ORAL | Status: AC

## 2012-08-05 NOTE — Assessment & Plan Note (Signed)
Uncontrolled, add clonidine to regimen recheck 2 weeks

## 2012-08-05 NOTE — Assessment & Plan Note (Signed)
Medications refilled

## 2012-08-05 NOTE — Progress Notes (Signed)
  Subjective:    [name], male    DOB: 07-05-1956, 56 y.o.   MRN: [id number]  HPI  Patient to follow chronic medical problems. He's been congested with some wheezing and cough for the past week. She's been using his nebulizer treatments which have helped some. He is using his Advair as prescribed. He was seen by orthopedics and concern for left rotator cuff tear however he was undergoing MRI but they have difficulties regarding his shunt therefore this has been placed on hold. He is asking for refill on pain medication today.  Diabetes mellitus he was taken off his metformin at her last visit his fasting blood sugars have been 100-112  Hypertension he states that he is taking his medications as prescribed however does not have them here today.  Review of Systems  GEN- denies fatigue, fever, weight loss,weakness, recent illness HEENT- denies eye drainage, change in vision, nasal discharge, CVS- denies chest pain, palpitations RESP- denies SOB, +cough, +wheeze ABD- denies N/V, change in stools, abd pain GU- denies dysuria, hematuria, dribbling, incontinence MSK- + joint pain, muscle aches, injury Neuro- denies headache, dizziness, syncope, seizure activity      Objective:   Physical Exam GEN- NAD, alert and oriented x3 HEENT- PERRL, EOMI, non injected sclera, pink conjunctiva, MMM, oropharynx clear Neck- Supple, no LAD CVS- RRR, no murmur RESP-bialteral wheeze, + rhonchi, fair air movement, normal WOB, no retractions EXT- No edema Pulses- Radial, DP- 2+        Assessment & Plan:

## 2012-08-05 NOTE — Assessment & Plan Note (Signed)
Will start antibiotics and prednisone for acute exacerbation continue neb treatments, No respiratory distress however he has decompensated in the past quickly

## 2012-08-05 NOTE — Assessment & Plan Note (Signed)
Diet controlled.  

## 2012-08-05 NOTE — Telephone Encounter (Signed)
08/05/12 called back to follow up on records; spoke with Terri in Medical Records, who will check on, and will contact us with any record information as soon as she can.

## 2012-08-05 NOTE — Patient Instructions (Addendum)
For your blood pressure I have added new pill ( now have 4 blood pressure pills) Take antibiotics and prednisone Pain medication refilled F/U 2 weeks for blood pressure/ flu shot

## 2012-08-13 NOTE — Telephone Encounter (Signed)
08/12/12 Patient's medical records from [facility] have been received.  His MRI has been pending, due to needing medical information about the shunt.  Records are in your box for review.    Also called to [facility], left message for Vincenza Hews, in Radiology, who was working with patient on this as well.

## 2012-08-25 ENCOUNTER — Ambulatory Visit: Payer: Medicaid Other | Admitting: Family Medicine

## 2012-09-02 ENCOUNTER — Ambulatory Visit: Payer: Medicaid Other | Admitting: Family Medicine

## 2012-09-11 ENCOUNTER — Telehealth: Payer: Self-pay | Admitting: Orthopedic Surgery

## 2012-09-11 NOTE — Telephone Encounter (Signed)
Reschedule after the new year

## 2012-09-11 NOTE — Telephone Encounter (Signed)
Called back to patient, relayed.  Advised we will need authorization from primary care physician due to insurance.  Patient aware he will be notified of appointment.

## 2012-09-11 NOTE — Telephone Encounter (Signed)
Patient following up on MRI; I have been following up on status of [facility] radiology department's review of the medical records received; they cannot determine from this record, what type of shunt is in his brain; therefore, MRI has not been able to be done.  We have pursued with several sources, as patient is unable to locate an identification card; patient aware of status.  His question is:  Can he get an injection in shoulder for some possible relief?  He was last seen 06/19/12.  Or does he need to have a CT scan due to MRI not able to be performed?  His cell Ph # is [phone].

## 2012-09-12 ENCOUNTER — Ambulatory Visit (INDEPENDENT_AMBULATORY_CARE_PROVIDER_SITE_OTHER): Payer: Medicaid Other | Admitting: Family Medicine

## 2012-09-12 ENCOUNTER — Encounter: Payer: Self-pay | Admitting: Family Medicine

## 2012-09-12 VITALS — BP 146/90 | HR 88 | Resp 18 | Ht 68.0 in | Wt 236.0 lb

## 2012-09-12 DIAGNOSIS — M75102 Unspecified rotator cuff tear or rupture of left shoulder, not specified as traumatic: Secondary | ICD-10-CM

## 2012-09-12 DIAGNOSIS — I10 Essential (primary) hypertension: Secondary | ICD-10-CM

## 2012-09-12 DIAGNOSIS — E785 Hyperlipidemia, unspecified: Secondary | ICD-10-CM

## 2012-09-12 DIAGNOSIS — E119 Type 2 diabetes mellitus without complications: Secondary | ICD-10-CM

## 2012-09-12 DIAGNOSIS — S43429A Sprain of unspecified rotator cuff capsule, initial encounter: Secondary | ICD-10-CM

## 2012-09-12 DIAGNOSIS — Z79899 Other long term (current) drug therapy: Secondary | ICD-10-CM

## 2012-09-12 MED ORDER — HYDROCODONE-ACETAMINOPHEN 7.5-325 MG PO TABS: 1.0000 | ORAL_TABLET | Freq: Four times a day (QID) | ORAL | Status: DC | PRN

## 2012-09-12 NOTE — Assessment & Plan Note (Signed)
His blood pressure looks okay even though he has not had his meds in one week. Advised him to take his medications as prescribed. I cannot make any changes as he does not have his meds with him. He will get his fasting labs before her next visit. We reviewed the importance of bringing his medications and taking them.

## 2012-09-12 NOTE — Addendum Note (Signed)
Addended by: Kandis Fantasia B on: 09/12/2012 02:04 PM   Modules accepted: Orders

## 2012-09-12 NOTE — Patient Instructions (Addendum)
Continue current blood pressure medications- Pain meds refilled  F/U 3 months Get labs 1 week before next visit

## 2012-09-12 NOTE — Progress Notes (Signed)
  Subjective:    [name], male    DOB: [birth year], 56 y.o.   MRN: [id number]  HPI  Patient here to followup his blood pressure however he does not have his medications with him and he has not taken them in one week because he had to move in with his mother to care for her. He's due for refill on his pain medication. He otherwise is doing well. He will need a new referral to return to ortho for his shoulder   Review of Systems   GEN- denies fatigue, fever, weight loss,weakness, recent illness HEENT- denies eye drainage, change in vision, nasal discharge, CVS- denies chest pain, palpitations RESP- denies SOB, cough, wheeze ABD- denies N/V, change in stools, abd pain GU- denies dysuria, hematuria, dribbling, incontinence MSK- + joint pain, muscle aches, injury Neuro- denies headache, dizziness, syncope, seizure activity      Objective:   Physical Exam GEN- NAD, alert and oriented x3 CVS- RRR, no murmur RESP-CTAB Ext- no Edema Radial- 2+        Assessment & Plan:

## 2012-09-12 NOTE — Assessment & Plan Note (Signed)
It appears to have some difficulty because of an old shunt that he has in his head whether or not he can get MRI. The last note stated that he needed another referral I will send another referral. He will be continued on his chronic pain medications urine drug screen was sent.

## 2012-09-13 LAB — DRUG SCREEN, URINE
Amphetamine Screen, Ur: NEGATIVE
Barbiturate Quant, Ur: NEGATIVE
Marijuana Metabolite: NEGATIVE
Methadone: NEGATIVE
Opiates: NEGATIVE

## 2012-09-22 ENCOUNTER — Other Ambulatory Visit: Payer: Self-pay | Admitting: Family Medicine

## 2012-10-07 ENCOUNTER — Ambulatory Visit (INDEPENDENT_AMBULATORY_CARE_PROVIDER_SITE_OTHER): Payer: Medicaid Other | Admitting: Orthopedic Surgery

## 2012-10-07 ENCOUNTER — Encounter: Payer: Self-pay | Admitting: Orthopedic Surgery

## 2012-10-07 VITALS — BP 132/80 | Ht 68.0 in | Wt 236.0 lb

## 2012-10-07 DIAGNOSIS — M75100 Unspecified rotator cuff tear or rupture of unspecified shoulder, not specified as traumatic: Secondary | ICD-10-CM

## 2012-10-07 DIAGNOSIS — S43429A Sprain of unspecified rotator cuff capsule, initial encounter: Secondary | ICD-10-CM

## 2012-10-07 NOTE — Progress Notes (Signed)
[name], male   DOB: [birth year], 57 y.o.   MRN: [id number] Chief Complaint  Patient presents with  . Follow-up    recheck left shoulder   The patient follows up after several months unable to obtain MRI to evaluate his left rotator cuff.  His primary care physician is given him a prescription for pain medication but he said he wanted another one so we would have one on file. He said he had to take the medicine more than prescribed. Of course, this request was declined he is advised to go back to her for further medication if needed.  The patient has had several issues regarding his followup. He said he had to go to [place] to take care of his mother. He then called repeatedly for more medication.  He does have a shunt he is followed by Dr. Loleta Chance in [place] every 6 months but no one knows what type of shunt it is. So we cannot do an MRI of the shoulder.  He continues to complain of throbbing aching pain in the left shoulder and inability to sleep on his left side with radiation of pain into his cervical spine  His exam shows give way weakness and painful for elevation after 120 no instability no real weakness to manual muscle testing with distraction skin intact good pulse no lymphadenopathy normal sensation no pathologic reflexes normal balance. Ambulation normal. Mood and affect normal. He is oriented x3. His appearance is normal. 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  Subacromial Shoulder Injection Procedure Note  Pre-operative Diagnosis: left RC Syndrome  Post-operative Diagnosis: same  Indications: pain   Anesthesia: ethyl chloride   Procedure Details   Verbal consent was obtained for  the procedure. The shoulder was prepped withalcohol and the skin was anesthetized. A 20 gauge needle was advanced into the subacromial space through posterior approach without difficulty  The space was then injected with 3 ml 1% lidocaine and 1 ml of depomedrol. The injection site was cleansed with isopropyl alcohol and a dressing was applied.  Complications:  None; patient tolerated the procedure well.

## 2012-10-15 ENCOUNTER — Telehealth: Payer: Self-pay | Admitting: Radiology

## 2012-10-15 NOTE — Telephone Encounter (Signed)
Patient has shoulder arthrogram at [facility] on 10-20-12 at 8:45. Patient will follow up back here in our office for results.

## 2012-10-20 ENCOUNTER — Other Ambulatory Visit (HOSPITAL_COMMUNITY): Payer: Medicaid Other

## 2012-10-22 ENCOUNTER — Other Ambulatory Visit (HOSPITAL_COMMUNITY): Payer: Medicaid Other

## 2012-10-27 ENCOUNTER — Other Ambulatory Visit: Payer: Self-pay | Admitting: Family Medicine

## 2012-10-28 ENCOUNTER — Ambulatory Visit: Payer: Medicaid Other | Admitting: Orthopedic Surgery

## 2012-10-29 ENCOUNTER — Inpatient Hospital Stay (HOSPITAL_COMMUNITY): Admission: RE | Admit: 2012-10-29 | Payer: Medicaid Other | Source: Ambulatory Visit

## 2012-11-06 ENCOUNTER — Inpatient Hospital Stay (HOSPITAL_COMMUNITY): Admission: RE | Admit: 2012-11-06 | Payer: Medicaid Other | Source: Ambulatory Visit

## 2012-11-06 ENCOUNTER — Ambulatory Visit: Payer: Medicaid Other | Admitting: Family Medicine

## 2012-11-11 ENCOUNTER — Other Ambulatory Visit: Payer: Self-pay

## 2012-11-11 ENCOUNTER — Telehealth: Payer: Self-pay | Admitting: Family Medicine

## 2012-11-11 ENCOUNTER — Ambulatory Visit: Payer: Medicaid Other | Admitting: Family Medicine

## 2012-11-11 ENCOUNTER — Telehealth: Payer: Self-pay | Admitting: Radiology

## 2012-11-11 NOTE — Telephone Encounter (Signed)
Noted  

## 2012-11-11 NOTE — Telephone Encounter (Signed)
Noted and changed in system

## 2012-11-11 NOTE — Telephone Encounter (Signed)
Patient did not go for his Arthrogram of his shoulder, so he cannot be scheduled to come back.

## 2012-11-17 ENCOUNTER — Encounter: Payer: Self-pay | Admitting: Family Medicine

## 2012-11-17 ENCOUNTER — Ambulatory Visit (INDEPENDENT_AMBULATORY_CARE_PROVIDER_SITE_OTHER): Payer: Medicaid Other | Admitting: Family Medicine

## 2012-11-17 VITALS — BP 148/80 | HR 68 | Resp 18 | Ht 68.0 in | Wt 236.1 lb

## 2012-11-17 DIAGNOSIS — I10 Essential (primary) hypertension: Secondary | ICD-10-CM

## 2012-11-17 DIAGNOSIS — J449 Chronic obstructive pulmonary disease, unspecified: Secondary | ICD-10-CM

## 2012-11-17 DIAGNOSIS — M75102 Unspecified rotator cuff tear or rupture of left shoulder, not specified as traumatic: Secondary | ICD-10-CM

## 2012-11-17 MED ORDER — HYDROCHLOROTHIAZIDE 25 MG PO TABS: 25.0000 mg | ORAL_TABLET | Freq: Every day | ORAL | Status: DC

## 2012-11-17 MED ORDER — ALBUTEROL SULFATE HFA 108 (90 BASE) MCG/ACT IN AERS: 2.0000 | INHALATION_SPRAY | Freq: Four times a day (QID) | RESPIRATORY_TRACT | Status: DC | PRN

## 2012-11-17 MED ORDER — HYDROCODONE-ACETAMINOPHEN 7.5-325 MG PO TABS: 1.0000 | ORAL_TABLET | Freq: Four times a day (QID) | ORAL | Status: DC | PRN

## 2012-11-17 MED ORDER — AMLODIPINE BESYLATE 10 MG PO TABS: 10.0000 mg | ORAL_TABLET | Freq: Every day | ORAL | Status: DC

## 2012-11-17 MED ORDER — LISINOPRIL 40 MG PO TABS: 40.0000 mg | ORAL_TABLET | Freq: Every day | ORAL | Status: DC

## 2012-11-17 MED ORDER — CLONIDINE HCL 0.2 MG PO TABS: 0.2000 mg | ORAL_TABLET | Freq: Two times a day (BID) | ORAL | Status: DC

## 2012-11-17 MED ORDER — FLUTICASONE-SALMETEROL 500-50 MCG/DOSE IN AEPB: 1.0000 | INHALATION_SPRAY | Freq: Every day | RESPIRATORY_TRACT | Status: DC | PRN

## 2012-11-17 NOTE — Progress Notes (Signed)
  Subjective:    [name], male    DOB: November 02, 1955, 57 y.o.   MRN: [id number]  HPI  Patient presents to followup 24-hour admission for COPD exacerbation. He was admitted February 10 to [facility] hospital after he returned to the ER with continued shortness of breath cough and wheeze. He was treated with steroids as well as antibiotics and he is completed home steroid taper. His breathing is nearing baseline states he is doing well. He was also noted to have hypertensive urgency and which I the medications were used to bring his blood pressure down. He states he is taking all his medications however then noted that he did not have the amlodipine  Review of Systems   GEN- denies fatigue, fever, weight loss,weakness, recent illness HEENT- denies eye drainage, change in vision, nasal discharge, CVS- denies chest pain, palpitations RESP- denies SOB, +cough, wheeze ABD- denies N/V, change in stools, abd pain GU- denies dysuria, hematuria, dribbling, incontinence MSK- + joint pain, muscle aches, injury Neuro- denies headache, dizziness, syncope, seizure activity      Objective:   Physical Exam  GEN- NAD, alert and oriented x3 HEENT- PERRL, EOMI, non injected sclera, pink conjunctiva, MMM, oropharynx clear Neck- Supple,  CVS- RRR, no murmur RESP-few scattered wheeze, otherwise CTAB EXT- No edema Pulses- Radial, DP- 2+       Assessment & Plan:

## 2012-11-17 NOTE — Assessment & Plan Note (Signed)
Blood pressure is still elevated unfortunately he will not bring his medications in therefore is difficult to tell what he is actually taking. I refilled all of his medications again today he is to followup in 2 weeks with all of his meds and blood pressure recheck

## 2012-11-17 NOTE — Patient Instructions (Addendum)
Restart the advair for your COPD/Asthma Take all 4 blood pressure medications - Norvasc, clonidine, HCTZ, lisinopril F/U 2 weeks for blood pressure- bring all medications with you

## 2012-11-17 NOTE — Assessment & Plan Note (Signed)
For some reason his hospital discharge papers state only asthma exacerbation however this was a COPD exacerbation. We discussed proper use of his inhalers I do not think he is actually had his Advair as it had not been refill on the past couple months.

## 2012-12-01 ENCOUNTER — Ambulatory Visit: Payer: Medicaid Other | Admitting: Family Medicine

## 2012-12-04 ENCOUNTER — Telehealth: Payer: Self-pay | Admitting: Family Medicine

## 2012-12-04 MED ORDER — GLUCOSE BLOOD VI STRP: ORAL_STRIP | Status: DC

## 2012-12-04 NOTE — Telephone Encounter (Signed)
Sent in

## 2012-12-08 ENCOUNTER — Ambulatory Visit: Payer: Medicaid Other | Admitting: Family Medicine

## 2012-12-15 ENCOUNTER — Ambulatory Visit: Payer: Medicaid Other | Admitting: Family Medicine

## 2012-12-15 ENCOUNTER — Ambulatory Visit (INDEPENDENT_AMBULATORY_CARE_PROVIDER_SITE_OTHER): Payer: Medicaid Other | Admitting: Family Medicine

## 2012-12-15 ENCOUNTER — Encounter: Payer: Self-pay | Admitting: Family Medicine

## 2012-12-15 VITALS — BP 154/90 | HR 62 | Resp 18 | Ht 68.0 in | Wt 236.1 lb

## 2012-12-15 DIAGNOSIS — J449 Chronic obstructive pulmonary disease, unspecified: Secondary | ICD-10-CM

## 2012-12-15 DIAGNOSIS — G8929 Other chronic pain: Secondary | ICD-10-CM

## 2012-12-15 DIAGNOSIS — I10 Essential (primary) hypertension: Secondary | ICD-10-CM

## 2012-12-15 DIAGNOSIS — M75102 Unspecified rotator cuff tear or rupture of left shoulder, not specified as traumatic: Secondary | ICD-10-CM

## 2012-12-15 MED ORDER — HYDROCODONE-ACETAMINOPHEN 7.5-325 MG PO TABS: 1.0000 | ORAL_TABLET | Freq: Four times a day (QID) | ORAL | Status: DC | PRN

## 2012-12-15 NOTE — Patient Instructions (Signed)
Use your inhalers as directed Pain medication refilled Bring all medications to next visit F/U 3 months

## 2012-12-15 NOTE — Assessment & Plan Note (Signed)
Failed UDS, discuss proper use of medications, he declined heavily any foul play with his medications Will allow one more chance, will random screen with UDS, if any other abnormalities he understands medications will be cut off completely

## 2012-12-15 NOTE — Assessment & Plan Note (Signed)
Uncontrolled, pharmacy will be contacted to see what meds he has picked up I will not make further change today as BP is higher than previous when changes made

## 2012-12-15 NOTE — Assessment & Plan Note (Signed)
Advised use of inhalers, no acute exacerbation

## 2012-12-15 NOTE — Progress Notes (Signed)
  Subjective:    [name], male    DOB: October 24, 1955, 56 y.o.   MRN: [id number]  HPI Patient here for interim visit for his blood pressure. Unfortunately he did not bring any of his medications with him. He states he's taken as directed. We also reviewed his urine drug screen which not show any hydrocodone which he is being prescribed 120 tablets. He denies selling his medication or giving him awake her friend states he is taking as prescribed on a regular basis.   Review of Systems - per above   GEN- denies fatigue, fever, weight loss,weakness, recent illness HEENT- denies eye drainage, change in vision, nasal discharge, CVS- denies chest pain, palpitations RESP- denies SOB, cough, wheeze Neuro- denies headache, dizziness, syncope, seizure activity      Objective:   Physical Exam GEN- NAD, alert and oriented x3 CVS- RRR, no murmur RESP-few scattered wheeze  EXT- No edema Pulses- Radial, DP- 2+        Assessment & Plan:

## 2012-12-28 ENCOUNTER — Encounter (HOSPITAL_COMMUNITY): Payer: Self-pay

## 2012-12-28 ENCOUNTER — Emergency Department (HOSPITAL_COMMUNITY)
Admission: EM | Admit: 2012-12-28 | Discharge: 2012-12-28 | Disposition: A | Discharge status: Home / Self Care | Payer: Medicaid Other | Attending: Emergency Medicine | Admitting: Emergency Medicine

## 2012-12-28 ENCOUNTER — Emergency Department (HOSPITAL_COMMUNITY): Payer: Medicaid Other

## 2012-12-28 DIAGNOSIS — R05 Cough: Secondary | ICD-10-CM | POA: Insufficient documentation

## 2012-12-28 DIAGNOSIS — R059 Cough, unspecified: Secondary | ICD-10-CM | POA: Insufficient documentation

## 2012-12-28 DIAGNOSIS — I10 Essential (primary) hypertension: Secondary | ICD-10-CM | POA: Insufficient documentation

## 2012-12-28 DIAGNOSIS — R61 Generalized hyperhidrosis: Secondary | ICD-10-CM | POA: Insufficient documentation

## 2012-12-28 DIAGNOSIS — J45901 Unspecified asthma with (acute) exacerbation: Secondary | ICD-10-CM | POA: Insufficient documentation

## 2012-12-28 DIAGNOSIS — R1310 Dysphagia, unspecified: Secondary | ICD-10-CM | POA: Insufficient documentation

## 2012-12-28 DIAGNOSIS — Z8639 Personal history of other endocrine, nutritional and metabolic disease: Secondary | ICD-10-CM | POA: Insufficient documentation

## 2012-12-28 DIAGNOSIS — Z862 Personal history of diseases of the blood and blood-forming organs and certain disorders involving the immune mechanism: Secondary | ICD-10-CM | POA: Insufficient documentation

## 2012-12-28 DIAGNOSIS — Z79899 Other long term (current) drug therapy: Secondary | ICD-10-CM | POA: Insufficient documentation

## 2012-12-28 DIAGNOSIS — J441 Chronic obstructive pulmonary disease with (acute) exacerbation: Secondary | ICD-10-CM | POA: Insufficient documentation

## 2012-12-28 DIAGNOSIS — E119 Type 2 diabetes mellitus without complications: Secondary | ICD-10-CM | POA: Insufficient documentation

## 2012-12-28 DIAGNOSIS — Z982 Presence of cerebrospinal fluid drainage device: Secondary | ICD-10-CM | POA: Insufficient documentation

## 2012-12-28 DIAGNOSIS — J020 Streptococcal pharyngitis: Secondary | ICD-10-CM | POA: Insufficient documentation

## 2012-12-28 DIAGNOSIS — Z87448 Personal history of other diseases of urinary system: Secondary | ICD-10-CM | POA: Insufficient documentation

## 2012-12-28 DIAGNOSIS — J3489 Other specified disorders of nose and nasal sinuses: Secondary | ICD-10-CM | POA: Insufficient documentation

## 2012-12-28 DIAGNOSIS — IMO0002 Reserved for concepts with insufficient information to code with codable children: Secondary | ICD-10-CM | POA: Insufficient documentation

## 2012-12-28 DIAGNOSIS — R498 Other voice and resonance disorders: Secondary | ICD-10-CM | POA: Insufficient documentation

## 2012-12-28 DIAGNOSIS — I5030 Unspecified diastolic (congestive) heart failure: Secondary | ICD-10-CM | POA: Insufficient documentation

## 2012-12-28 DIAGNOSIS — R6883 Chills (without fever): Secondary | ICD-10-CM | POA: Insufficient documentation

## 2012-12-28 LAB — BASIC METABOLIC PANEL
BUN: 14 mg/dL (ref 6–23)
Calcium: 9 mg/dL (ref 8.4–10.5)
Creatinine, Ser: 0.97 mg/dL (ref 0.50–1.35)
GFR calc Af Amer: >90 mL/min (ref >90)
GFR calc non Af Amer: >90 mL/min (ref >90)
Glucose, Bld: 126 mg/dL — ABNORMAL HIGH (ref 70–99)
Potassium: 3.4 mEq/L — ABNORMAL LOW (ref 3.5–5.1)

## 2012-12-28 LAB — CBC WITH DIFFERENTIAL/PLATELET
Basophils Relative: 1 % (ref 0–1)
Eosinophils Absolute: 0.3 10*3/uL (ref 0.0–0.7)
Eosinophils Relative: 5 % (ref 0–5)
Hemoglobin: 13.8 g/dL (ref 13.0–17.0)
MCH: 31.3 pg (ref 26.0–34.0)
MCHC: 33.1 g/dL (ref 30.0–36.0)
MCV: 94.6 fL (ref 78.0–100.0)
Monocytes Absolute: 0.4 10*3/uL (ref 0.1–1.0)
Monocytes Relative: 7 % (ref 3–12)
Neutrophils Relative %: 48 % (ref 43–77)

## 2012-12-28 MED ORDER — ALBUTEROL SULFATE (5 MG/ML) 0.5% IN NEBU
2.5000 mg | INHALATION_SOLUTION | Freq: Once | RESPIRATORY_TRACT | Status: AC
  Administered 2012-12-28: 2.5 mg via RESPIRATORY_TRACT
  Filled 2012-12-28: qty 0.5

## 2012-12-28 MED ORDER — DEXAMETHASONE SODIUM PHOSPHATE 4 MG/ML IJ SOLN
10.0000 mg | Freq: Once | INTRAMUSCULAR | Status: DC
  Filled 2012-12-28: qty 2

## 2012-12-28 MED ORDER — IPRATROPIUM BROMIDE 0.02 % IN SOLN
0.5000 mg | Freq: Once | RESPIRATORY_TRACT | Status: AC
  Administered 2012-12-28: 0.5 mg via RESPIRATORY_TRACT
  Filled 2012-12-28: qty 2.5

## 2012-12-28 MED ORDER — METHYLPREDNISOLONE SODIUM SUCC 125 MG IJ SOLR
125.0000 mg | Freq: Once | INTRAMUSCULAR | Status: AC
  Administered 2012-12-28: 125 mg via INTRAVENOUS
  Filled 2012-12-28: qty 2

## 2012-12-28 MED ORDER — METHYLPREDNISOLONE SODIUM SUCC 125 MG IJ SOLR: 125.0000 mg | Freq: Once | INTRAMUSCULAR | Status: DC

## 2012-12-28 MED ORDER — DEXAMETHASONE SODIUM PHOSPHATE 4 MG/ML IJ SOLN
10.0000 mg | Freq: Once | INTRAMUSCULAR | Status: AC
  Administered 2012-12-28: 10 mg

## 2012-12-28 MED ORDER — PENICILLIN G BENZATHINE 1200000 UNIT/2ML IM SUSP
1.2000 10*6.[IU] | Freq: Once | INTRAMUSCULAR | Status: AC
  Administered 2012-12-28: 1.2 10*6.[IU] via INTRAMUSCULAR
  Filled 2012-12-28 (×2): qty 2

## 2012-12-28 MED ORDER — SODIUM CHLORIDE 0.9 % IV SOLN
Freq: Once | INTRAVENOUS | Status: AC
  Administered 2012-12-28: 12:00:00 via INTRAVENOUS

## 2012-12-28 NOTE — ED Notes (Signed)
Pt given lunch tray, pt ate entire meal tray. Tolerated well. Breathing well.

## 2012-12-28 NOTE — ED Notes (Signed)
Pt reports being sick for 1 week w/ cough and congestion.  ?fever at times. Decreased bs bil.  Given solumedrol by ems. 125mg . And 1 altrovent breathing treatment. pta

## 2012-12-28 NOTE — ED Notes (Signed)
Pt to lobby in wheelchair. Awaiting ride to home.

## 2012-12-28 NOTE — ED Notes (Signed)
Pt breathing easier after treatments. Resting bed without complaints.

## 2012-12-28 NOTE — ED Provider Notes (Addendum)
History    This chart was scribed for [name], MD, by [name], ED scribe. The patient was seen in room APA19/APA19 and the patient's care was started at 1106.    CSN: [id number]  Arrival date & time 12/28/12  1032   First MD Initiated Contact with Patient 12/28/12 1106      Chief Complaint  Patient presents with  . Asthma    (Consider location/radiation/quality/duration/timing/severity/associated sxs/prior treatment) The history is provided by the patient and the EMS personnel. No language interpreter was used.   [name] is a 57 y.o. male brought in by EMS with a h/o of COPD and asthma who presents to the Emergency Department complaining of gradual onset, gradually worsening, 8/10, constant SOB with associated difficulty swallowing, productive cough, diaphoresis, chills, and sore throat that began 1 week ago. He also reports that he lost his voice 2 days ago. He denies any fever. He reports that he has been treating the SOB at home with his regularly prescribed breathing treatments with no relief. EMS reports that he was given 125 mg of solumedrol and 1 breathing treatment in route to ED. He is a current nonsmoker.  PCP is Dr. Jeanice Lim.  Past Medical History  Diagnosis Date  . Diabetes mellitus   . Hypertension   . Asthma   . COPD (chronic obstructive pulmonary disease)   . Hyperlipidemia   . Headache   . BPH (benign prostatic hyperplasia)   . Diastolic CHF   . Substance abuse     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 Cocaine    Past Surgical History  Procedure Laterality Date  . Brain surgery    . Ventriculoperitoneal shunt    . Colon surgery  06/2011    Diverticulitis Complicated  . Mandible surgery      Family History  Problem Relation Age of Onset  . Hypertension Mother   . Hyperlipidemia Mother   . Depression Mother   . Hypertension Father   . Hyperlipidemia Father   . Diabetes Father   . Arthritis    . Asthma      History  Substance Use Topics  . Smoking  status: Never Smoker   . Smokeless tobacco: Not on file  . Alcohol Use: No      Review of Systems  Constitutional: Positive for chills and diaphoresis. Negative for fever.  HENT: Positive for congestion, sore throat, trouble swallowing and voice change.   Respiratory: Positive for cough and shortness of breath.   All other systems reviewed and are negative.    Allergies  Morphine  Home Medications   Current Outpatient Rx  Name  Route  Sig  Dispense  Refill  . albuterol (PROVENTIL HFA;VENTOLIN HFA) 108 (90 BASE) MCG/ACT inhaler   Inhalation   Inhale 2 puffs into the lungs every 6 (six) hours as needed for wheezing.   1 Inhaler   3   . albuterol (PROVENTIL) (2.5 MG/3ML) 0.083% nebulizer solution   Nebulization   Take 6 mLs (5 mg total) by nebulization every 6 (six) hours as needed for wheezing.   75 mL   3   . amLODipine (NORVASC) 10 MG tablet   Oral   Take 1 tablet (10 mg total) by mouth daily.   30 tablet   3   . cloNIDine (CATAPRES) 0.2 MG tablet   Oral   Take 1 tablet (0.2 mg total) by mouth 2 (two) times daily.   60 tablet   3   . Fluticasone-Salmeterol (ADVAIR)  500-50 MCG/DOSE AEPB   Inhalation   Inhale 1 puff into the lungs 2 (two) times daily. COPD         . hydrochlorothiazide (HYDRODIURIL) 25 MG tablet   Oral   Take 1 tablet (25 mg total) by mouth daily.   30 tablet   3   . HYDROcodone-acetaminophen (NORCO) 7.5-325 MG per tablet   Oral   Take 1 tablet by mouth every 6 (six) hours as needed. Pain   120 tablet   0   . lisinopril (PRINIVIL,ZESTRIL) 40 MG tablet   Oral   Take 1 tablet (40 mg total) by mouth daily.   30 tablet   3   . Multiple Vitamin (MULTIVITAMIN WITH MINERALS) TABS   Oral   Take 1 tablet by mouth daily.         Marland Kitchen ACCU-CHEK FASTCLIX LANCETS MISC   Does not apply   1 each by Does not apply route daily.   102 each   5   . Blood Glucose Monitoring Suppl (ACCU-CHEK AVIVA PLUS) W/DEVICE KIT   Does not apply   1 kit  by Does not apply route daily.   1 kit   0   . glucose blood (ACCU-CHEK AVIVA PLUS) test strip      TEST BLOOD SUGAR ONCE DAILY   50 each   5     BP 166/102  Pulse 70  Temp(Src) 97.8 F (36.6 C) (Oral)  Resp 14  Ht 5\' 11"  (1.803 m)  Wt 236 lb (107.049 kg)  BMI 32.93 kg/m2  SpO2 97%  Physical Exam  Nursing note and vitals reviewed. Constitutional: He appears well-developed and well-nourished. Nasal cannula in place.  HENT:  Head: Normocephalic and atraumatic.  Mouth/Throat: Posterior oropharyngeal erythema present. No oropharyngeal exudate.  Pharynx is mildly erythematous without exudates. No difficulty with secretions. Voice is hoarse.   Eyes: Conjunctivae are normal.  Neck: Normal range of motion. Neck supple.  Cardiovascular: Normal rate, regular rhythm and normal heart sounds.   No murmur heard. Pulmonary/Chest: He has wheezes.  Few scattered wheezes.  Abdominal: Soft. Bowel sounds are normal. There is no tenderness.  Musculoskeletal: Normal range of motion. He exhibits no tenderness.  Neurological: He is alert.  Skin: Skin is warm and dry. No erythema.  Psychiatric: He has a normal mood and affect. Thought content normal.    ED Course  Procedures (including critical care time)  DIAGNOSTIC STUDIES: Oxygen Saturation is 97%% on 2 L/Min nasal cannula, adequate by my interpretation.    COORDINATION OF CARE:  11:13- Discussed planned course of treatment with the patient, including a breathing treatment, blood work, chest X-ray, and rapid strep test, who is agreeable at this time.  11:15- Medication Orders- albuterol (proventil) (5mg /ml) 0.5% nebulizer solution 2.5 mg-once, ipratropium (atrovent) nebulizer solution 0.5 mg- once.  12:30- Medication Orders- methylprednisolone sodium succinate (solumedrol) 125 mg/55mL injection 125 mg- once  Results for orders placed during the hospital encounter of 12/28/12  RAPID STREP SCREEN      Result Value Range    Streptococcus, Group A Screen (Direct) POSITIVE (*) NEGATIVE  CBC WITH DIFFERENTIAL      Result Value Range   WBC 6.4  4.0 - 10.5 K/uL   RBC 4.41  4.22 - 5.81 MIL/uL   Hemoglobin 13.8  13.0 - 17.0 g/dL   HCT 16.1  09.6 - 04.5 %   MCV 94.6  78.0 - 100.0 fL   MCH 31.3  26.0 - 34.0 pg  MCHC 33.1  30.0 - 36.0 g/dL   RDW 40.9  81.1 - 91.4 %   Platelets 196  150 - 400 K/uL   Neutrophils Relative 48  43 - 77 %   Neutro Abs 3.1  1.7 - 7.7 K/uL   Lymphocytes Relative 40  12 - 46 %   Lymphs Abs 2.5  0.7 - 4.0 K/uL   Monocytes Relative 7  3 - 12 %   Monocytes Absolute 0.4  0.1 - 1.0 K/uL   Eosinophils Relative 5  0 - 5 %   Eosinophils Absolute 0.3  0.0 - 0.7 K/uL   Basophils Relative 1  0 - 1 %   Basophils Absolute 0.0  0.0 - 0.1 K/uL  BASIC METABOLIC PANEL      Result Value Range   Sodium 141  135 - 145 mEq/L   Potassium 3.4 (*) 3.5 - 5.1 mEq/L   Chloride 100  96 - 112 mEq/L   CO2 34 (*) 19 - 32 mEq/L   Glucose, Bld 126 (*) 70 - 99 mg/dL   BUN 14  6 - 23 mg/dL   Creatinine, Ser 7.82  0.50 - 1.35 mg/dL   Calcium 9.0  8.4 - 95.6 mg/dL   GFR calc non Af Amer >90  >90 mL/min   GFR calc Af Amer >90  >90 mL/min  GLUCOSE, CAPILLARY      Result Value Range   Glucose-Capillary 111 (*) 70 - 99 mg/dL   Dg Chest Portable 1 View  12/28/2012  *RADIOLOGY REPORT*  Clinical Data: Asthma and shortness of breath.  PORTABLE CHEST - 1 VIEW  Comparison: 11/03/2012  Findings: Lungs are clear with minimal scarring/atelectasis at the left base.  There is stable chronic elevation of the right hemidiaphragm.  Stable tortuosity the thoracic aorta.  Heart size is normal.  IMPRESSION: No active disease.  Minimal scarring/atelectasis at the left lung base.   Original Report Authenticated By: Irish Lack, M.D.     Date: 12/28/2012  Rate: 62  Rhythm: normal sinus rhythm  QRS Axis: left  Intervals: PR prolonged  ST/T Wave abnormalities: nonspecific T wave changes  Conduction Disutrbances:first-degree A-V block    Narrative Interpretation: Left ventricular hypertrophy, nonspecific T wave changes which are probably related to left ventricular hypertrophy, first degree AV block. No prior ECG available for comparison.  Old EKG Reviewed: none available    1. Strep pharyngitis       MDM  Wheezing related to COPD. However, his difficulty swallowing and hoarse voice suggest pharyngitis. Strep screen will be obtained and appear to be given albuterol with Atrovent and given a dose of steroids.  He had no her improvement with albuterol with Atrovent. However, within 45 minutes of getting methylprednisolone, his voice improved and he stated he was feeling better. Strep screen has come back positive. Was offered the option of oral amoxicillin versus intramuscular Bicillin. He is requested intramuscular Bicillin which is given. He is given a dose of dexamethasone in the ED which along with his Bicillin should provide adequate treatment of his pharyngitis.   I personally performed the services described in this documentation, which was scribed in my presence. The recorded information has been reviewed and is accurate.          [name], MD 12/28/12 1429  [name], MD 12/28/12 [phone]

## 2012-12-31 ENCOUNTER — Emergency Department (HOSPITAL_COMMUNITY): Payer: Medicaid Other

## 2012-12-31 ENCOUNTER — Emergency Department (HOSPITAL_COMMUNITY)
Admission: EM | Admit: 2012-12-31 | Discharge: 2012-12-31 | Disposition: A | Discharge status: Home / Self Care | Payer: Medicaid Other | Attending: Emergency Medicine | Admitting: Emergency Medicine

## 2012-12-31 ENCOUNTER — Telehealth: Payer: Self-pay | Admitting: Family Medicine

## 2012-12-31 ENCOUNTER — Encounter (HOSPITAL_COMMUNITY): Payer: Self-pay | Admitting: Emergency Medicine

## 2012-12-31 DIAGNOSIS — L03221 Cellulitis of neck: Secondary | ICD-10-CM

## 2012-12-31 DIAGNOSIS — J45901 Unspecified asthma with (acute) exacerbation: Secondary | ICD-10-CM | POA: Insufficient documentation

## 2012-12-31 DIAGNOSIS — Z8639 Personal history of other endocrine, nutritional and metabolic disease: Secondary | ICD-10-CM | POA: Insufficient documentation

## 2012-12-31 DIAGNOSIS — J441 Chronic obstructive pulmonary disease with (acute) exacerbation: Secondary | ICD-10-CM | POA: Insufficient documentation

## 2012-12-31 DIAGNOSIS — Z862 Personal history of diseases of the blood and blood-forming organs and certain disorders involving the immune mechanism: Secondary | ICD-10-CM | POA: Insufficient documentation

## 2012-12-31 DIAGNOSIS — Z79899 Other long term (current) drug therapy: Secondary | ICD-10-CM | POA: Insufficient documentation

## 2012-12-31 DIAGNOSIS — Z87448 Personal history of other diseases of urinary system: Secondary | ICD-10-CM | POA: Insufficient documentation

## 2012-12-31 DIAGNOSIS — I10 Essential (primary) hypertension: Secondary | ICD-10-CM | POA: Insufficient documentation

## 2012-12-31 DIAGNOSIS — I5030 Unspecified diastolic (congestive) heart failure: Secondary | ICD-10-CM | POA: Insufficient documentation

## 2012-12-31 DIAGNOSIS — E119 Type 2 diabetes mellitus without complications: Secondary | ICD-10-CM | POA: Insufficient documentation

## 2012-12-31 DIAGNOSIS — L0211 Cutaneous abscess of neck: Secondary | ICD-10-CM | POA: Insufficient documentation

## 2012-12-31 DIAGNOSIS — IMO0002 Reserved for concepts with insufficient information to code with codable children: Secondary | ICD-10-CM | POA: Insufficient documentation

## 2012-12-31 DIAGNOSIS — J449 Chronic obstructive pulmonary disease, unspecified: Secondary | ICD-10-CM

## 2012-12-31 LAB — CBC WITH DIFFERENTIAL/PLATELET
Basophils Absolute: 0 10*3/uL (ref 0.0–0.1)
Eosinophils Absolute: 0.2 10*3/uL (ref 0.0–0.7)
Eosinophils Relative: 1 % (ref 0–5)
HCT: 43.3 % (ref 39.0–52.0)
MCH: 31.6 pg (ref 26.0–34.0)
MCV: 94.3 fL (ref 78.0–100.0)
Monocytes Absolute: 0.8 10*3/uL (ref 0.1–1.0)
Platelets: 258 10*3/uL (ref 150–400)
RDW: 12.7 % (ref 11.5–15.5)

## 2012-12-31 LAB — BASIC METABOLIC PANEL
CO2: 33 mEq/L — ABNORMAL HIGH (ref 19–32)
Calcium: 9.2 mg/dL (ref 8.4–10.5)
Creatinine, Ser: 0.86 mg/dL (ref 0.50–1.35)
GFR calc non Af Amer: >90 mL/min (ref >90)
Glucose, Bld: 110 mg/dL — ABNORMAL HIGH (ref 70–99)
Sodium: 142 mEq/L (ref 135–145)

## 2012-12-31 MED ORDER — DOXYCYCLINE HYCLATE 100 MG PO CAPS: 100.0000 mg | ORAL_CAPSULE | Freq: Two times a day (BID) | ORAL | Status: DC

## 2012-12-31 MED ORDER — DOXYCYCLINE HYCLATE 100 MG PO TABS
100.0000 mg | ORAL_TABLET | Freq: Once | ORAL | Status: AC
  Administered 2012-12-31: 100 mg via ORAL
  Filled 2012-12-31: qty 1

## 2012-12-31 MED ORDER — ALBUTEROL SULFATE (5 MG/ML) 0.5% IN NEBU
10.0000 mg | INHALATION_SOLUTION | Freq: Once | RESPIRATORY_TRACT | Status: AC
  Administered 2012-12-31: 10 mg via RESPIRATORY_TRACT

## 2012-12-31 MED ORDER — OXYCODONE-ACETAMINOPHEN 5-325 MG PO TABS: 1.0000 | ORAL_TABLET | Freq: Four times a day (QID) | ORAL | Status: DC | PRN

## 2012-12-31 MED ORDER — IPRATROPIUM BROMIDE 0.02 % IN SOLN
0.5000 mg | Freq: Once | RESPIRATORY_TRACT | Status: AC
  Administered 2012-12-31: 0.5 mg via RESPIRATORY_TRACT
  Filled 2012-12-31: qty 2.5

## 2012-12-31 MED ORDER — ALBUTEROL (5 MG/ML) CONTINUOUS INHALATION SOLN
INHALATION_SOLUTION | RESPIRATORY_TRACT | Status: AC
  Filled 2012-12-31: qty 20

## 2012-12-31 MED ORDER — PREDNISONE 20 MG PO TABS: ORAL_TABLET | ORAL | Status: DC

## 2012-12-31 MED ORDER — SODIUM CHLORIDE 0.9 % IV SOLN
Freq: Once | INTRAVENOUS | Status: AC
  Administered 2012-12-31: 11:00:00 via INTRAVENOUS

## 2012-12-31 MED ORDER — IPRATROPIUM BROMIDE 0.02 % IN SOLN
RESPIRATORY_TRACT | Status: AC
  Filled 2012-12-31: qty 2.5

## 2012-12-31 MED ORDER — ALBUTEROL SULFATE (5 MG/ML) 0.5% IN NEBU
5.0000 mg | INHALATION_SOLUTION | Freq: Once | RESPIRATORY_TRACT | Status: AC
  Administered 2012-12-31: 5 mg via RESPIRATORY_TRACT
  Filled 2012-12-31: qty 1

## 2012-12-31 MED ORDER — ALBUTEROL SULFATE (5 MG/ML) 0.5% IN NEBU
INHALATION_SOLUTION | RESPIRATORY_TRACT | Status: AC
  Filled 2012-12-31: qty 1

## 2012-12-31 MED ORDER — METHYLPREDNISOLONE SODIUM SUCC 125 MG IJ SOLR
125.0000 mg | Freq: Once | INTRAMUSCULAR | Status: AC
  Administered 2012-12-31: 125 mg via INTRAVENOUS
  Filled 2012-12-31: qty 2

## 2012-12-31 MED ORDER — KETOROLAC TROMETHAMINE 30 MG/ML IJ SOLN
30.0000 mg | Freq: Once | INTRAMUSCULAR | Status: AC
  Administered 2012-12-31: 30 mg via INTRAVENOUS
  Filled 2012-12-31: qty 1

## 2012-12-31 NOTE — Telephone Encounter (Signed)
Noted.  Physician not in office.
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
## 2012-12-31 NOTE — ED Provider Notes (Addendum)
History  This chart was scribed for Donnetta Hutching, MD by Shari Heritage, ED Scribe. The patient was seen in room APA05/APA05. Patient's care was started at 1013.   CSN: [id number]  Arrival date & time 12/31/12  1009   First MD Initiated Contact with Patient 12/31/12 1013      Chief Complaint  Patient presents with  . Shortness of Breath     The history is provided by the patient. No language interpreter was used.    HPI Comments: [name] is a 57 y.o. male with history of COPD, asthma, diastolic CHF, diabetes, hypertension and hyperlipidemia who presents to the Emergency Department complaining of persistent, moderate shortness of breath and wheezing for the past 2-3 weeks. He has dyspnea on exertion. There is no cough, chest pain or leg swelling. Patient uses a nebulizer machine and albuterol inhaler at home to manage asthma/COPD symptoms. He has had 2-3 other clinical visits with his PCP and [facility] for similar symptoms within the past few weeks. Patient also reports a moderate to severely painful sore to the right lateral neck. Per medical records, patient was seen here on 12/28/2012 complaining of the same. He was treated for strep pharyngitis with IM bicillin. He also received albuterol with Atrovent and methylprednisolone. He showed no improvement in the ED after albuterol treatment, but felt better after steroids. Patient does not smoke or use alcohol. He has a history of substance abuse (crack cocaine).    Past Medical History  Diagnosis Date  . Diabetes mellitus   . Hypertension   . Asthma   . COPD (chronic obstructive pulmonary disease)   . Hyperlipidemia   . Headache   . BPH (benign prostatic hyperplasia)   . Diastolic CHF   . Substance abuse     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 Cocaine    Past Surgical History  Procedure Laterality Date  . Brain surgery    . Ventriculoperitoneal shunt    . Colon surgery  06/2011    Diverticulitis Complicated  . Mandible surgery       Family History  Problem Relation Age of Onset  . Hypertension Mother   . Hyperlipidemia Mother   . Depression Mother   . Hypertension Father   . Hyperlipidemia Father   . Diabetes Father   . Arthritis    . Asthma      History  Substance Use Topics  . Smoking status: Never Smoker   . Smokeless tobacco: Not on file  . Alcohol Use: No      Review of Systems A complete 10 system review of systems was obtained and all systems are negative except as noted in the HPI and PMH.   Allergies  Morphine  Home Medications   Current Outpatient Rx  Name  Route  Sig  Dispense  Refill  . ACCU-CHEK FASTCLIX LANCETS MISC   Does not apply   1 each by Does not apply route daily.   102 each   5   . albuterol (PROVENTIL HFA;VENTOLIN HFA) 108 (90 BASE) MCG/ACT inhaler   Inhalation   Inhale 2 puffs into the lungs every 6 (six) hours as needed for wheezing.   1 Inhaler   3   . EXPIRED: albuterol (PROVENTIL) (2.5 MG/3ML) 0.083% nebulizer solution   Nebulization   Take 6 mLs (5 mg total) by nebulization every 6 (six) hours as needed for wheezing.   75 mL   3   . amLODipine (NORVASC) 10 MG tablet   Oral  Take 1 tablet (10 mg total) by mouth daily.   30 tablet   3   . Blood Glucose Monitoring Suppl (ACCU-CHEK AVIVA PLUS) W/DEVICE KIT   Does not apply   1 kit by Does not apply route daily.   1 kit   0   . cloNIDine (CATAPRES) 0.2 MG tablet   Oral   Take 1 tablet (0.2 mg total) by mouth 2 (two) times daily.   60 tablet   3   . Fluticasone-Salmeterol (ADVAIR) 500-50 MCG/DOSE AEPB   Inhalation   Inhale 1 puff into the lungs 2 (two) times daily. COPD         . glucose blood (ACCU-CHEK AVIVA PLUS) test strip      TEST BLOOD SUGAR ONCE DAILY   50 each   5   . hydrochlorothiazide (HYDRODIURIL) 25 MG tablet   Oral   Take 1 tablet (25 mg total) by mouth daily.   30 tablet   3   . HYDROcodone-acetaminophen (NORCO) 7.5-325 MG per tablet   Oral   Take 1 tablet by  mouth every 6 (six) hours as needed. Pain   120 tablet   0   . lisinopril (PRINIVIL,ZESTRIL) 40 MG tablet   Oral   Take 1 tablet (40 mg total) by mouth daily.   30 tablet   3   . Multiple Vitamin (MULTIVITAMIN WITH MINERALS) TABS   Oral   Take 1 tablet by mouth daily.           Triage Vitals: BP 158/108  Pulse 89  Temp(Src) 98.6 F (37 C) (Oral)  Resp 20  Ht 5\' 8"  (1.727 m)  Wt 235 lb (106.595 kg)  BMI 35.74 kg/m2  SpO2 93%  Physical Exam  Nursing note and vitals reviewed. Constitutional: He is oriented to person, place, and time. He appears well-developed and well-nourished.  HENT:  Head: Normocephalic and atraumatic.  Eyes: Conjunctivae and EOM are normal. Pupils are equal, round, and reactive to light.  Neck: Normal range of motion. Neck supple.  Cardiovascular: Normal rate, regular rhythm and normal heart sounds.   Pulmonary/Chest: Effort normal. He has wheezes (bilateral, expiratory).  Abdominal: Soft. Bowel sounds are normal.  Musculoskeletal: Normal range of motion.  Neurological: He is alert and oriented to person, place, and time.  Skin: Skin is warm and dry. There is erythema.  Area of erythema approximately 2.5 cm in diameter with a central crusting to right lateral neck.   Psychiatric: He has a normal mood and affect.    ED Course  Procedures (including critical care time) DIAGNOSTIC STUDIES: Oxygen Saturation is 93% on room air, adequate by my interpretation.    COORDINATION OF CARE: 10:16 AM- Will give breathing treatments and order chest x-ray. Patient informed of current plan for treatment and evaluation and agrees with plan at this time.  Dg Chest 2 View  12/31/2012  *RADIOLOGY REPORT*  Clinical Data: Shortness of breath.  CHEST - 2 VIEW  Comparison: Chest x-ray 11/03/2012.  Findings: Lung volumes are low.  There are some bibasilar linear appearing opacities most compatible with areas of chronic scarring or subsegmental atelectasis.  No definite  acute consolidative airspace disease.  No pleural effusions.  Mild elevation of the right hemidiaphragm (unchanged).  No evidence of pulmonary edema. Heart size is within normal limits.  Upper mediastinal contours are unremarkable.  Radiopaque tubing is seen projecting over the right hemithorax extending toward the abdomen, likely to represent ventriculoperitoneal shunt tubing.  IMPRESSION: 1.  Low lung volumes with probable bibasilar subsegmental atelectasis or chronic scarring.   Original Report Authenticated By: Trudie Reed, M.D.     Results for orders placed during the hospital encounter of 12/31/12  CBC WITH DIFFERENTIAL      Result Value Range   WBC 10.9 (*) 4.0 - 10.5 K/uL   RBC 4.59  4.22 - 5.81 MIL/uL   Hemoglobin 14.5  13.0 - 17.0 g/dL   HCT 16.1  09.6 - 04.5 %   MCV 94.3  78.0 - 100.0 fL   MCH 31.6  26.0 - 34.0 pg   MCHC 33.5  30.0 - 36.0 g/dL   RDW 40.9  81.1 - 91.4 %   Platelets 258  150 - 400 K/uL   Neutrophils Relative 61  43 - 77 %   Neutro Abs 6.7  1.7 - 7.7 K/uL   Lymphocytes Relative 29  12 - 46 %   Lymphs Abs 3.2  0.7 - 4.0 K/uL   Monocytes Relative 8  3 - 12 %   Monocytes Absolute 0.8  0.1 - 1.0 K/uL   Eosinophils Relative 1  0 - 5 %   Eosinophils Absolute 0.2  0.0 - 0.7 K/uL   Basophils Relative 0  0 - 1 %   Basophils Absolute 0.0  0.0 - 0.1 K/uL  BASIC METABOLIC PANEL      Result Value Range   Sodium 142  135 - 145 mEq/L   Potassium 3.1 (*) 3.5 - 5.1 mEq/L   Chloride 102  96 - 112 mEq/L   CO2 33 (*) 19 - 32 mEq/L   Glucose, Bld 110 (*) 70 - 99 mg/dL   BUN 12  6 - 23 mg/dL   Creatinine, Ser 7.82  0.50 - 1.35 mg/dL   Calcium 9.2  8.4 - 95.6 mg/dL   GFR calc non Af Amer >90  >90 mL/min   GFR calc Af Amer >90  >90 mL/min    Labs Reviewed - No data to display No results found.   No diagnosis found.    MDM  Patient is hemodynamically stable.  No severe dyspnea. Chest x-ray shows no acute findings.  He has developed cellulitis on the right lateral  neck.  Discharge meds prednisone taper, doxycycline 100 mg twice a day #20, Percocet #15      I personally performed the services described in this documentation, which was scribed in my presence. The recorded information has been reviewed and is accurate.    Donnetta Hutching, MD 12/31/12 1410  Donnetta Hutching, MD 12/31/12 [phone]

## 2012-12-31 NOTE — Telephone Encounter (Signed)
Please have him go to ER to be evaluated, I am not able to determine how long they keep him. Best if he can go to [facility]

## 2012-12-31 NOTE — ED Notes (Signed)
States that he has been having shortness of breath x1 weeks, was seen last week for the same as well as strep throat.

## 2012-12-31 NOTE — ED Notes (Signed)
Patient states that he does not really feel any improvement in his symptoms.

## 2012-12-31 NOTE — Telephone Encounter (Signed)
Noted patient did go to [facility] ED.

## 2013-01-05 MED FILL — Albuterol Sulfate Soln Nebu 0.5% (5 MG/ML): RESPIRATORY_TRACT | Qty: 1 | Status: AC

## 2013-01-13 ENCOUNTER — Encounter: Payer: Self-pay | Admitting: Family Medicine

## 2013-01-13 ENCOUNTER — Ambulatory Visit (INDEPENDENT_AMBULATORY_CARE_PROVIDER_SITE_OTHER): Payer: Medicaid Other | Admitting: Family Medicine

## 2013-01-13 ENCOUNTER — Other Ambulatory Visit: Payer: Self-pay | Admitting: Family Medicine

## 2013-01-13 VITALS — BP 146/94 | HR 70 | Resp 18 | Ht 68.0 in | Wt 241.0 lb

## 2013-01-13 DIAGNOSIS — S43429A Sprain of unspecified rotator cuff capsule, initial encounter: Secondary | ICD-10-CM

## 2013-01-13 DIAGNOSIS — E119 Type 2 diabetes mellitus without complications: Secondary | ICD-10-CM

## 2013-01-13 DIAGNOSIS — M75102 Unspecified rotator cuff tear or rupture of left shoulder, not specified as traumatic: Secondary | ICD-10-CM

## 2013-01-13 DIAGNOSIS — J449 Chronic obstructive pulmonary disease, unspecified: Secondary | ICD-10-CM

## 2013-01-13 DIAGNOSIS — Z79899 Other long term (current) drug therapy: Secondary | ICD-10-CM

## 2013-01-13 DIAGNOSIS — I10 Essential (primary) hypertension: Secondary | ICD-10-CM

## 2013-01-13 DIAGNOSIS — M67919 Unspecified disorder of synovium and tendon, unspecified shoulder: Secondary | ICD-10-CM

## 2013-01-13 MED ORDER — HYDROCODONE-ACETAMINOPHEN 7.5-325 MG PO TABS: 1.0000 | ORAL_TABLET | Freq: Four times a day (QID) | ORAL | Status: DC | PRN

## 2013-01-13 MED ORDER — TIOTROPIUM BROMIDE MONOHYDRATE 18 MCG IN CAPS: 18.0000 ug | ORAL_CAPSULE | Freq: Every day | RESPIRATORY_TRACT | Status: DC

## 2013-01-13 NOTE — Patient Instructions (Addendum)
Get the labs done fasting Next Wednesday Start the 2nd inhaler called spiriva - once a day  Continue advair  Continue blood pressure pills Urine Drugs Screen F/U 3 months

## 2013-01-14 LAB — PRESCRIPTION ABUSE MONITORING 15P, URINE
Amphetamine/Meth: NEGATIVE ng/mL
Benzodiazepine Screen, Urine: NEGATIVE ng/mL
Buprenorphine, Urine: NEGATIVE ng/mL
Cannabinoid Scrn, Ur: NEGATIVE ng/mL
Carisoprodol, Urine: NEGATIVE ng/mL
Fentanyl, Ur: NEGATIVE ng/mL
Tramadol Scrn, Ur: NEGATIVE ng/mL
Zolpidem, Urine: NEGATIVE ng/mL

## 2013-01-15 LAB — OPIATES/OPIOIDS (LC/MS-MS)
Heroin (6-AM), UR: NEGATIVE ng/mL
Morphine Urine: NEGATIVE ng/mL
Norhydrocodone, Ur: 3943 ng/mL
Oxycodone, ur: NEGATIVE ng/mL
Oxymorphone: NEGATIVE ng/mL

## 2013-01-18 NOTE — Assessment & Plan Note (Signed)
Deteriorated, will add spiriva to advair, hopefully will decrease his exacerbations Albuterol as needed

## 2013-01-18 NOTE — Progress Notes (Signed)
  Subjective:    [name], male    DOB: [birth year], 57 y.o.   MRN: [id number]  HPI  Pt here to f/u ER visit for COPD, s/p steroids, antibiotics, also treated for infected black head on side of neck. Taking meds but none with him today. Requesting refill on pain medications.  Review of Systems   GEN- denies fatigue, fever, weight loss,weakness, recent illness HEENT- denies eye drainage, change in vision, nasal discharge, CVS- denies chest pain, palpitations RESP- denies SOB, cough, wheeze ABD- denies N/V, change in stools, abd pain GU- denies dysuria, hematuria, dribbling, incontinence MSK- + joint pain, muscle aches, injury Neuro- denies headache, dizziness, syncope, seizure activity      Objective:   Physical Exam  GEN- NAD, alert and oriented x3 HEENT- PERRL, EOMI, non injected sclera, pink conjunctiva, MMM, oropharynx clear Neck- Supple,  CVS- RRR, no murmur RESP-few scattered wheeze, no rhonchi, normal WOB, no rales EXT- No edema Pulses- Radial, DP- 2+ Skin- Right anterior neck- healing abscess, no drainage      Assessment & Plan:

## 2013-01-18 NOTE — Assessment & Plan Note (Signed)
Diet controlled ,A1C with next set of labs

## 2013-01-18 NOTE — Assessment & Plan Note (Signed)
Still unable to have surgery, UDS to be done, he understands if any abnormal testing no further narcotics

## 2013-01-18 NOTE — Assessment & Plan Note (Signed)
BP improved today.  

## 2013-01-21 LAB — BASIC METABOLIC PANEL
BUN: 9 mg/dL (ref 6–23)
Calcium: 9.2 mg/dL (ref 8.4–10.5)
Creat: 0.94 mg/dL (ref 0.50–1.35)
Glucose, Bld: 87 mg/dL (ref 70–99)
Sodium: 138 mEq/L (ref 135–145)

## 2013-01-21 LAB — LIPID PANEL: LDL Cholesterol: 124 mg/dL — ABNORMAL HIGH (ref 0–99)

## 2013-01-21 LAB — HEMOGLOBIN A1C: Hgb A1c MFr Bld: 5.3 % (ref <5.7)

## 2013-01-22 MED ORDER — SIMVASTATIN 10 MG PO TABS: 10.0000 mg | ORAL_TABLET | Freq: Every evening | ORAL | Status: DC

## 2013-01-22 NOTE — Addendum Note (Signed)
Addended by: Milinda Antis F on: 01/22/2013 11:35 PM   Modules accepted: Orders

## 2013-02-10 ENCOUNTER — Other Ambulatory Visit: Payer: Self-pay

## 2013-02-10 DIAGNOSIS — M75102 Unspecified rotator cuff tear or rupture of left shoulder, not specified as traumatic: Secondary | ICD-10-CM

## 2013-02-10 MED ORDER — HYDROCODONE-ACETAMINOPHEN 7.5-325 MG PO TABS: 1.0000 | ORAL_TABLET | Freq: Four times a day (QID) | ORAL | Status: DC | PRN

## 2013-03-10 ENCOUNTER — Telehealth: Payer: Self-pay | Admitting: Family Medicine

## 2013-03-10 DIAGNOSIS — M75102 Unspecified rotator cuff tear or rupture of left shoulder, not specified as traumatic: Secondary | ICD-10-CM

## 2013-03-10 NOTE — Telephone Encounter (Signed)
Okay to refill pt must pick-up

## 2013-03-10 NOTE — Telephone Encounter (Signed)
?  ok to refill °

## 2013-03-11 MED ORDER — HYDROCODONE-ACETAMINOPHEN 7.5-325 MG PO TABS: 1.0000 | ORAL_TABLET | Freq: Four times a day (QID) | ORAL | Status: DC | PRN

## 2013-03-11 NOTE — Telephone Encounter (Signed)
Med refilled and signed.

## 2013-03-16 ENCOUNTER — Ambulatory Visit: Payer: Medicaid Other | Admitting: Family Medicine

## 2013-04-07 ENCOUNTER — Telehealth: Payer: Self-pay | Admitting: Family Medicine

## 2013-04-07 DIAGNOSIS — M75102 Unspecified rotator cuff tear or rupture of left shoulder, not specified as traumatic: Secondary | ICD-10-CM

## 2013-04-07 MED ORDER — HYDROCODONE-ACETAMINOPHEN 7.5-325 MG PO TABS: 1.0000 | ORAL_TABLET | Freq: Four times a day (QID) | ORAL | Status: DC | PRN

## 2013-04-07 NOTE — Telephone Encounter (Signed)
Ok to refill 

## 2013-04-07 NOTE — Telephone Encounter (Signed)
Okay to refill pt must pick up

## 2013-04-07 NOTE — Telephone Encounter (Signed)
Med refilled,signed and ready for pickup

## 2013-04-14 ENCOUNTER — Ambulatory Visit: Payer: Self-pay | Admitting: Family Medicine

## 2013-04-14 ENCOUNTER — Ambulatory Visit: Payer: Medicaid Other | Admitting: Family Medicine

## 2013-04-20 ENCOUNTER — Ambulatory Visit: Payer: Self-pay | Admitting: Family Medicine

## 2013-04-28 ENCOUNTER — Encounter: Payer: Self-pay | Admitting: Family Medicine

## 2013-04-28 ENCOUNTER — Ambulatory Visit (INDEPENDENT_AMBULATORY_CARE_PROVIDER_SITE_OTHER): Payer: Medicaid Other | Admitting: Family Medicine

## 2013-04-28 VITALS — BP 120/80 | HR 68 | Temp 97.3°F | Resp 18 | Wt 223.0 lb

## 2013-04-28 DIAGNOSIS — E119 Type 2 diabetes mellitus without complications: Secondary | ICD-10-CM

## 2013-04-28 DIAGNOSIS — E669 Obesity, unspecified: Secondary | ICD-10-CM

## 2013-04-28 DIAGNOSIS — E785 Hyperlipidemia, unspecified: Secondary | ICD-10-CM

## 2013-04-28 DIAGNOSIS — S43429A Sprain of unspecified rotator cuff capsule, initial encounter: Secondary | ICD-10-CM

## 2013-04-28 DIAGNOSIS — J449 Chronic obstructive pulmonary disease, unspecified: Secondary | ICD-10-CM

## 2013-04-28 DIAGNOSIS — I10 Essential (primary) hypertension: Secondary | ICD-10-CM

## 2013-04-28 DIAGNOSIS — M75102 Unspecified rotator cuff tear or rupture of left shoulder, not specified as traumatic: Secondary | ICD-10-CM

## 2013-04-28 LAB — MICROALBUMIN / CREATININE URINE RATIO
Creatinine, Urine: 270.6 mg/dL
Microalb, Ur: 2.18 mg/dL — ABNORMAL HIGH (ref 0.00–1.89)

## 2013-04-28 MED ORDER — HYDROCODONE-ACETAMINOPHEN 7.5-325 MG PO TABS: 1.0000 | ORAL_TABLET | Freq: Four times a day (QID) | ORAL | Status: DC | PRN

## 2013-04-28 NOTE — Assessment & Plan Note (Signed)
Was started on simvastatin at last visit Will recheck lipid panel and LFTs

## 2013-04-28 NOTE — Patient Instructions (Signed)
Continue current medications Call [facility] [phone] about eye exam and glasses Get your labs done next week fasting we will call with results F/U 3 months

## 2013-04-28 NOTE — Assessment & Plan Note (Signed)
Weight loss noted, continue walking program

## 2013-04-28 NOTE — Assessment & Plan Note (Signed)
Diet control we'll check urine microalbumin Also given information for ophthalmology

## 2013-04-28 NOTE — Assessment & Plan Note (Signed)
Blood pressure is much improved today we'll continue current medications

## 2013-04-28 NOTE — Assessment & Plan Note (Addendum)
His breathing is much improved he has not had any exacerbations since her last visit we will continue Advair and Spiriva He needs Pneumovaccine as I can not find where this was given

## 2013-04-28 NOTE — Progress Notes (Signed)
  Subjective:    [name], male    DOB: 1956-05-27, 57 y.o.   MRN: [id number]  HPI  Patient here to follow chronic medical problems. He has no specific concerns today. States he's doing well. Has been exercising and has lost about 18 pounds since her last visit. States his breathing is much improved the medications and he's taken his blood pressure medications as prescribed. He is tolerating the new cholesterol medication without any difficulties.   Review of Systems   GEN- denies fatigue, fever, weight loss,weakness, recent illness HEENT- denies eye drainage, change in vision, nasal discharge, CVS- denies chest pain, palpitations RESP- denies SOB, cough, wheeze ABD- denies N/V, change in stools, abd pain GU- denies dysuria, hematuria, dribbling, incontinence MSK- denies joint pain, muscle aches, injury Neuro- denies headache, dizziness, syncope, seizure activity Endo- denies polyuria, polydipsia,      Objective:   Physical Exam GEN- NAD, alert and oriented x3, +weight loss HEENT- PERRL, EOMI, non injected sclera, pink conjunctiva, MMM, oropharynx clear Neck- Supple,  CVS- RRR, no murmur RESP-CTAB EXT- No edema Pulses- Radial, DP- 2+       Assessment & Plan:

## 2013-04-30 LAB — HEPATIC FUNCTION PANEL
ALT: <8 U/L (ref 0–53)
Bilirubin, Direct: 0.2 mg/dL (ref 0.0–0.3)
Indirect Bilirubin: 1.1 mg/dL — ABNORMAL HIGH (ref 0.0–0.9)
Total Bilirubin: 1.3 mg/dL — ABNORMAL HIGH (ref 0.3–1.2)

## 2013-04-30 LAB — LIPID PANEL
Cholesterol: 169 mg/dL (ref 0–200)
VLDL: 25 mg/dL (ref 0–40)

## 2013-06-01 ENCOUNTER — Telehealth: Payer: Self-pay | Admitting: Family Medicine

## 2013-06-01 DIAGNOSIS — M75102 Unspecified rotator cuff tear or rupture of left shoulder, not specified as traumatic: Secondary | ICD-10-CM

## 2013-06-01 NOTE — Telephone Encounter (Signed)
Ok to refill 

## 2013-06-01 NOTE — Telephone Encounter (Signed)
Norco 7.5-325 mg 1 q6 hours prn pain

## 2013-06-02 MED ORDER — HYDROCODONE-ACETAMINOPHEN 7.5-325 MG PO TABS: 1.0000 | ORAL_TABLET | Freq: Four times a day (QID) | ORAL | Status: DC | PRN

## 2013-06-02 NOTE — Telephone Encounter (Signed)
Okay to refill, must come in to get script

## 2013-06-02 NOTE — Telephone Encounter (Signed)
Script printed ready to sign pt aware

## 2013-06-30 ENCOUNTER — Telehealth: Payer: Self-pay | Admitting: Family Medicine

## 2013-06-30 DIAGNOSIS — M75102 Unspecified rotator cuff tear or rupture of left shoulder, not specified as traumatic: Secondary | ICD-10-CM

## 2013-06-30 NOTE — Telephone Encounter (Signed)
Needs Rx for Hydrocodone

## 2013-06-30 NOTE — Telephone Encounter (Signed)
Not due until 07/02/13 °

## 2013-07-01 MED ORDER — HYDROCODONE-ACETAMINOPHEN 7.5-325 MG PO TABS: 1.0000 | ORAL_TABLET | Freq: Four times a day (QID) | ORAL | Status: DC | PRN

## 2013-07-01 NOTE — Telephone Encounter (Signed)
Ok to refill 

## 2013-07-01 NOTE — Telephone Encounter (Signed)
Okay to fill? 

## 2013-07-01 NOTE — Telephone Encounter (Signed)
Rx printed out,ready for dr [employer]

## 2013-07-29 ENCOUNTER — Ambulatory Visit (INDEPENDENT_AMBULATORY_CARE_PROVIDER_SITE_OTHER): Payer: Medicaid Other | Admitting: Family Medicine

## 2013-07-29 ENCOUNTER — Encounter: Payer: Self-pay | Admitting: Family Medicine

## 2013-07-29 VITALS — BP 120/78 | HR 68 | Temp 97.1°F | Resp 18 | Ht 66.0 in | Wt 225.0 lb

## 2013-07-29 DIAGNOSIS — I10 Essential (primary) hypertension: Secondary | ICD-10-CM

## 2013-07-29 DIAGNOSIS — Z23 Encounter for immunization: Secondary | ICD-10-CM

## 2013-07-29 DIAGNOSIS — M75102 Unspecified rotator cuff tear or rupture of left shoulder, not specified as traumatic: Secondary | ICD-10-CM

## 2013-07-29 DIAGNOSIS — G8929 Other chronic pain: Secondary | ICD-10-CM

## 2013-07-29 DIAGNOSIS — J449 Chronic obstructive pulmonary disease, unspecified: Secondary | ICD-10-CM

## 2013-07-29 DIAGNOSIS — E119 Type 2 diabetes mellitus without complications: Secondary | ICD-10-CM

## 2013-07-29 DIAGNOSIS — S43429A Sprain of unspecified rotator cuff capsule, initial encounter: Secondary | ICD-10-CM

## 2013-07-29 LAB — HEMOGLOBIN A1C, FINGERSTICK: Hgb A1C (fingerstick): 4.9 % (ref <5.7)

## 2013-07-29 MED ORDER — HYDROCODONE-ACETAMINOPHEN 7.5-325 MG PO TABS: 1.0000 | ORAL_TABLET | Freq: Four times a day (QID) | ORAL | Status: DC | PRN

## 2013-07-29 NOTE — Addendum Note (Signed)
Addended by: Elvina Mattes T on: 07/29/2013 11:39 AM   Modules accepted: Orders

## 2013-07-29 NOTE — Progress Notes (Signed)
  Subjective:    [name], male    DOB: July 16, 1956, 57 y.o.   MRN: [id number]  HPI  Patient here follow chronic medical problems. He has no specific concerns today. He is doing well. His breathing has been good. He is exercising by walking more. He is maintaining his 20 pound weight loss. Diabetes mellitus his blood sugars fasting have been in the low 100s. He is currently diet controlled. Chronic pain- requesting pain medication refill    Review of Systems  GEN- denies fatigue, fever, weight loss,weakness, recent illness HEENT- denies eye drainage, change in vision, nasal discharge, CVS- denies chest pain, palpitations RESP- denies SOB, cough, wheeze ABD- denies N/V, change in stools, abd pain GU- denies dysuria, hematuria, dribbling, incontinence MSK- denies joint pain, muscle aches, injury Neuro- denies headache, dizziness, syncope, seizure activity       Objective:   Physical Exam GEN- NAD, alert and oriented x3 HEENT- PERRL, EOMI, non injected sclera, pink conjunctiva, MMM, oropharynx clear CVS- RRR, no murmur RESP-CTAB EXT- No edema, feet no lesions  Pulses- Radial, DP- 2+        Assessment & Plan:

## 2013-07-29 NOTE — Assessment & Plan Note (Signed)
Stable, Flu shot given Pneumovaccine on back order

## 2013-07-29 NOTE — Patient Instructions (Signed)
Continue current medications Flu shot given  Schedule your Eye exam F/U 3 months

## 2013-07-29 NOTE — Assessment & Plan Note (Signed)
Well-controlled on current meds 

## 2013-07-29 NOTE — Assessment & Plan Note (Signed)
Diet controlled, A1C 4.7%, much improved with weight loss

## 2013-07-29 NOTE — Assessment & Plan Note (Signed)
Pain meds refilled UDS done today

## 2013-07-29 NOTE — Addendum Note (Signed)
Addended by: Milinda Antis F on: 07/29/2013 09:44 AM   Modules accepted: Level of Service

## 2013-08-31 ENCOUNTER — Telehealth: Payer: Self-pay | Admitting: Family Medicine

## 2013-08-31 DIAGNOSIS — M75102 Unspecified rotator cuff tear or rupture of left shoulder, not specified as traumatic: Secondary | ICD-10-CM

## 2013-08-31 MED ORDER — HYDROCODONE-ACETAMINOPHEN 7.5-325 MG PO TABS: 1.0000 | ORAL_TABLET | Freq: Four times a day (QID) | ORAL | Status: DC | PRN

## 2013-08-31 NOTE — Telephone Encounter (Signed)
Ok to refill 

## 2013-08-31 NOTE — Telephone Encounter (Signed)
Call back number is [phone] Pt is needing a refill on his hydrocodone  Can someone call him in advance so he can get a ride out here to get his medication

## 2013-08-31 NOTE — Telephone Encounter (Signed)
REFILLED

## 2013-08-31 NOTE — Telephone Encounter (Signed)
Pt aware to pick up medication

## 2013-09-28 ENCOUNTER — Telehealth: Payer: Self-pay | Admitting: Family Medicine

## 2013-09-28 DIAGNOSIS — M75102 Unspecified rotator cuff tear or rupture of left shoulder, not specified as traumatic: Secondary | ICD-10-CM

## 2013-09-28 NOTE — Telephone Encounter (Signed)
Pt is needing a refill on his hydrocodone  Call back number is [phone]

## 2013-09-28 NOTE — Telephone Encounter (Signed)
Not due for refill until 10/01/12

## 2013-10-01 MED ORDER — HYDROCODONE-ACETAMINOPHEN 7.5-325 MG PO TABS: 1.0000 | ORAL_TABLET | Freq: Four times a day (QID) | ORAL | Status: DC | PRN

## 2013-10-01 NOTE — Telephone Encounter (Signed)
Med refilled, approval MBD, pt here to pick up

## 2013-10-30 ENCOUNTER — Ambulatory Visit (INDEPENDENT_AMBULATORY_CARE_PROVIDER_SITE_OTHER): Payer: Medicaid Other | Admitting: Family Medicine

## 2013-10-30 ENCOUNTER — Encounter: Payer: Self-pay | Admitting: Family Medicine

## 2013-10-30 VITALS — BP 140/100 | HR 64 | Temp 98.2°F | Resp 18 | Ht 66.0 in | Wt 231.0 lb

## 2013-10-30 DIAGNOSIS — J4489 Other specified chronic obstructive pulmonary disease: Secondary | ICD-10-CM

## 2013-10-30 DIAGNOSIS — I10 Essential (primary) hypertension: Secondary | ICD-10-CM

## 2013-10-30 DIAGNOSIS — M75102 Unspecified rotator cuff tear or rupture of left shoulder, not specified as traumatic: Secondary | ICD-10-CM

## 2013-10-30 DIAGNOSIS — E119 Type 2 diabetes mellitus without complications: Secondary | ICD-10-CM

## 2013-10-30 DIAGNOSIS — E785 Hyperlipidemia, unspecified: Secondary | ICD-10-CM

## 2013-10-30 DIAGNOSIS — Z79899 Other long term (current) drug therapy: Secondary | ICD-10-CM

## 2013-10-30 DIAGNOSIS — J449 Chronic obstructive pulmonary disease, unspecified: Secondary | ICD-10-CM

## 2013-10-30 DIAGNOSIS — S43429A Sprain of unspecified rotator cuff capsule, initial encounter: Secondary | ICD-10-CM

## 2013-10-30 DIAGNOSIS — Z23 Encounter for immunization: Secondary | ICD-10-CM

## 2013-10-30 LAB — CBC WITH DIFFERENTIAL/PLATELET
Basophils Absolute: 0 10*3/uL (ref 0.0–0.1)
Basophils Relative: 0 % (ref 0–1)
Eosinophils Absolute: 0.1 10*3/uL (ref 0.0–0.7)
Eosinophils Relative: 2 % (ref 0–5)
HCT: 42.2 % (ref 39.0–52.0)
Hemoglobin: 14.1 g/dL (ref 13.0–17.0)
LYMPHS ABS: 2.4 10*3/uL (ref 0.7–4.0)
LYMPHS PCT: 34 % (ref 12–46)
MCH: 32.2 pg (ref 26.0–34.0)
MCHC: 33.4 g/dL (ref 30.0–36.0)
MCV: 96.3 fL (ref 78.0–100.0)
Monocytes Absolute: 0.5 10*3/uL (ref 0.1–1.0)
Monocytes Relative: 7 % (ref 3–12)
NEUTROS PCT: 57 % (ref 43–77)
Neutro Abs: 4.1 10*3/uL (ref 1.7–7.7)
PLATELETS: 277 10*3/uL (ref 150–400)
RBC: 4.38 MIL/uL (ref 4.22–5.81)
RDW: 13.4 % (ref 11.5–15.5)
WBC: 7.2 10*3/uL (ref 4.0–10.5)

## 2013-10-30 LAB — LIPID PANEL
CHOL/HDL RATIO: 3 ratio
CHOLESTEROL: 184 mg/dL (ref 0–200)
HDL: 61 mg/dL (ref >39)
LDL Cholesterol: 106 mg/dL — ABNORMAL HIGH (ref 0–99)
Triglycerides: 83 mg/dL (ref <150)
VLDL: 17 mg/dL (ref 0–40)

## 2013-10-30 LAB — COMPREHENSIVE METABOLIC PANEL
ALBUMIN: 4.3 g/dL (ref 3.5–5.2)
ALT: <8 U/L (ref 0–53)
AST: 18 U/L (ref 0–37)
Alkaline Phosphatase: 69 U/L (ref 39–117)
BUN: 11 mg/dL (ref 6–23)
CO2: 31 meq/L (ref 19–32)
Calcium: 9.5 mg/dL (ref 8.4–10.5)
Chloride: 101 mEq/L (ref 96–112)
Creat: 0.96 mg/dL (ref 0.50–1.35)
GLUCOSE: 87 mg/dL (ref 70–99)
POTASSIUM: 4 meq/L (ref 3.5–5.3)
SODIUM: 140 meq/L (ref 135–145)
TOTAL PROTEIN: 7.4 g/dL (ref 6.0–8.3)
Total Bilirubin: 0.8 mg/dL (ref 0.2–1.2)

## 2013-10-30 LAB — HEMOGLOBIN A1C
Hgb A1c MFr Bld: 5.2 % (ref <5.7)
Mean Plasma Glucose: 103 mg/dL (ref <117)

## 2013-10-30 MED ORDER — CLONIDINE HCL 0.2 MG PO TABS: 0.2000 mg | ORAL_TABLET | Freq: Two times a day (BID) | ORAL | Status: DC

## 2013-10-30 MED ORDER — SIMVASTATIN 10 MG PO TABS: 10.0000 mg | ORAL_TABLET | Freq: Every evening | ORAL | Status: DC

## 2013-10-30 MED ORDER — HYDROCODONE-ACETAMINOPHEN 7.5-325 MG PO TABS: 1.0000 | ORAL_TABLET | Freq: Four times a day (QID) | ORAL | Status: DC | PRN

## 2013-10-30 MED ORDER — AMLODIPINE BESYLATE 10 MG PO TABS: 10.0000 mg | ORAL_TABLET | Freq: Every day | ORAL | Status: DC

## 2013-10-30 MED ORDER — LISINOPRIL 40 MG PO TABS: 40.0000 mg | ORAL_TABLET | Freq: Every day | ORAL | Status: DC

## 2013-10-30 NOTE — Patient Instructions (Signed)
Pneumonia vaccine given Continue meds Watch the salt intake We will send letter with lab results F/U 4 months

## 2013-10-30 NOTE — Assessment & Plan Note (Signed)
Recent exacerbation Pneumonia vaccine given Continue advair

## 2013-10-30 NOTE — Assessment & Plan Note (Signed)
Recheck LFT On Statin drug

## 2013-10-30 NOTE — Assessment & Plan Note (Signed)
Diet controlled On ACE Statin

## 2013-11-01 NOTE — Assessment & Plan Note (Signed)
Recently taken meds, his BP has been well controlled past few months while on meds, no changes today

## 2013-11-01 NOTE — Progress Notes (Signed)
Patient ID: Vernon MaltaJimmy L Mendoza, male   DOB: 01/07/1956, 58 y.o.   MRN: [id number]   Subjective:    Patient ID: Vernon MaltaJimmy L Mendoza, male    DOB: 01/07/1956, 58 y.o.   MRN: [id number]  Patient presents for 3 mos follow up History of HTN,DM, COPD, Chronic shoulder pain. Diabetes mellitus is diet controlled due for repeat A1C. COPD- [facility] for [facility], completed steroids about 3 weeks ago, has minimal cough now. Using inhalers as prescribed. Chronic pain- due to refill on pain medications, UDS ordered as on pain contract    Review Of Systems:  GEN- denies fatigue, fever, weight loss,weakness, recent illness HEENT- denies eye drainage, change in vision, nasal discharge, CVS- denies chest pain, palpitations RESP- denies SOB, +cough, wheeze MSK- denies joint pain, muscle aches, injury Neuro- denies headache, dizziness, syncope, seizure activity       Objective:    BP 140/100  Pulse 64  Temp(Src) 98.2 F (36.8 C) (Oral)  Resp 18  Ht 5\' 6"  (1.676 m)  Wt 231 lb (104.781 kg)  BMI 37.30 kg/m2 GEN- NAD, alert and oriented x3 HEENT- PERRL, EOMI, non injected sclera, pink conjunctiva, MMM, oropharynx clear CVS- RRR, no murmur RESP-few scattered wheeze, normal WOB, no rhonchi EXT- No edema, feet no open lesions Pulses- Radial 2+        Assessment & Plan:      Problem List Items Addressed This Visit   Rotator cuff tear, left   Relevant Medications      HYDROcodone-acetaminophen (NORCO) 7.5-325 MG per tablet   HYPERTENSION     Recently taken meds, his BP has been well controlled past few months while on meds, no changes today    Relevant Medications      cloNIDine (CATAPRES) tablet      amLODIpine (NORVASC) tablet      lisinopril (PRINIVIL,ZESTRIL) tablet      simvastatin (ZOCOR) tablet   Hyperlipidemia - Primary     Recheck LFT On Statin drug    Relevant Orders      Lipid panel (Completed)   DM     Diet controlled On ACE Statin    Relevant Orders       Hemoglobin A1c (Completed)      Comprehensive metabolic panel (Completed)      CBC with Differential (Completed)   COPD (chronic obstructive pulmonary disease)     Recent exacerbation Pneumonia vaccine given Continue advair      Other Visit Diagnoses   Need for prophylactic vaccination against Streptococcus pneumoniae (pneumococcus)        Relevant Orders       Pneumococcal polysaccharide vaccine 23-valent greater than or equal to 2yo subcutaneous/IM (Completed)    Encounter for long-term (current) use of high-risk medication        Relevant Orders       Prescript Monitor Profile(13)       Note: This dictation was prepared with Dragon dictation along with smaller phrase technology. Any transcriptional errors that result from this process are unintentional.

## 2013-11-03 ENCOUNTER — Encounter: Payer: Self-pay | Admitting: *Deleted

## 2013-11-03 LAB — PRESCRIPTION MONITORING PROFILE (13 PANEL)
Amphetamine/Meth: NEGATIVE ng/mL
BARBITURATE SCREEN, URINE: NEGATIVE ng/mL
BENZODIAZEPINE SCREEN, URINE: NEGATIVE ng/mL
Buprenorphine, Urine: NEGATIVE ng/mL
COCAINE METABOLITES: NEGATIVE ng/mL
Cannabinoid Scrn, Ur: NEGATIVE ng/mL
Creatinine, Urine: 118.98 mg/dL (ref >20.0)
Fentanyl, Ur: NEGATIVE ng/mL
MEPERIDINE UR: NEGATIVE ng/mL
Methadone Screen, Urine: NEGATIVE ng/mL
Nitrites, Initial: NEGATIVE ug/mL
PH URINE, INITIAL: 5.7 pH (ref 4.5–8.9)
PROPOXYPHENE: NEGATIVE ng/mL
Tramadol Scrn, Ur: NEGATIVE ng/mL

## 2013-11-03 LAB — OPIATES/OPIOIDS (LC/MS-MS)
Codeine Urine: NEGATIVE ng/mL
HEROIN (6-AM), UR: NEGATIVE ng/mL
Hydrocodone: 3426 ng/mL — AB
Hydromorphone: 355 ng/mL — AB
Morphine Urine: NEGATIVE ng/mL
NORHYDROCODONE, UR: 2740 ng/mL — AB
NOROXYCODONE, UR: 58 ng/mL — AB
OXYCODONE, UR: NEGATIVE ng/mL
Oxymorphone: NEGATIVE ng/mL

## 2013-11-03 LAB — OXYCODONE, URINE (LC/MS-MS)
Noroxycodone, Ur: 58 ng/mL — AB
OXYCODONE, UR: NEGATIVE ng/mL
OXYMORPHONE, URINE: NEGATIVE ng/mL

## 2013-11-25 ENCOUNTER — Telehealth: Payer: Self-pay | Admitting: *Deleted

## 2013-11-25 DIAGNOSIS — M75102 Unspecified rotator cuff tear or rupture of left shoulder, not specified as traumatic: Secondary | ICD-10-CM

## 2013-11-25 MED ORDER — HYDROCODONE-ACETAMINOPHEN 7.5-325 MG PO TABS: 1.0000 | ORAL_TABLET | Freq: Four times a day (QID) | ORAL | Status: DC | PRN

## 2013-11-25 NOTE — Telephone Encounter (Signed)
Med printed ready for dr.approval and pt to pick up on Friday

## 2013-11-25 NOTE — Telephone Encounter (Signed)
?   Ok to refill, last refill 10/30/13, pt will pick up on fri.

## 2013-11-25 NOTE — Telephone Encounter (Signed)
Okay to refill? 

## 2013-12-25 ENCOUNTER — Telehealth: Payer: Self-pay | Admitting: *Deleted

## 2013-12-25 DIAGNOSIS — M75102 Unspecified rotator cuff tear or rupture of left shoulder, not specified as traumatic: Secondary | ICD-10-CM

## 2013-12-25 MED ORDER — HYDROCODONE-ACETAMINOPHEN 7.5-325 MG PO TABS: 1.0000 | ORAL_TABLET | Freq: Four times a day (QID) | ORAL | Status: DC | PRN

## 2013-12-25 NOTE — Telephone Encounter (Signed)
Prescription printed and patient made aware to come to office to pick up.  

## 2013-12-25 NOTE — Telephone Encounter (Signed)
Okay to refill? 

## 2013-12-25 NOTE — Telephone Encounter (Addendum)
Ok to refill??  Last office visit 10/30/2013.  Last refill 11/25/2013.

## 2014-01-25 ENCOUNTER — Other Ambulatory Visit: Payer: Self-pay | Admitting: Family Medicine

## 2014-01-25 ENCOUNTER — Telehealth: Payer: Self-pay | Admitting: *Deleted

## 2014-01-25 DIAGNOSIS — M75102 Unspecified rotator cuff tear or rupture of left shoulder, not specified as traumatic: Secondary | ICD-10-CM

## 2014-01-25 MED ORDER — HYDROCODONE-ACETAMINOPHEN 7.5-325 MG PO TABS: 1.0000 | ORAL_TABLET | Freq: Four times a day (QID) | ORAL | Status: DC | PRN

## 2014-01-25 NOTE — Telephone Encounter (Signed)
Call returned to patient.   Reports that he has been in [facility] in [facility].   States that multiple scans were obtained, but no one is explaining to him if he is ok or not.   Advised that he would need to sign release of records for [facility] to send records to our office.

## 2014-01-25 NOTE — Telephone Encounter (Signed)
Ok to refill??  Last office visit 10/30/2013.  Last refill 12/25/2013.

## 2014-01-25 NOTE — Telephone Encounter (Signed)
Prescription printed and patient made aware to come to office to pick up.  

## 2014-01-25 NOTE — Telephone Encounter (Signed)
Okay to refill? 

## 2014-01-25 NOTE — Telephone Encounter (Signed)
Message copied by Phillips OdorSIX, CHRISTINA H on Mon Jan 25, 2014  3:17 PM ------      Message from: Malvin JohnsBULLINS, SUSAN S      Created: Mon Jan 25, 2014  1:58 PM       Patient is calling to talk to you or dr Jeanice Limdurham about his recent scans and not finding any answers as to what is wrong with him please call him back at [phone] ------

## 2014-02-08 ENCOUNTER — Encounter: Payer: Self-pay | Admitting: Family Medicine

## 2014-02-23 ENCOUNTER — Telehealth: Payer: Self-pay | Admitting: Family Medicine

## 2014-02-23 DIAGNOSIS — M75102 Unspecified rotator cuff tear or rupture of left shoulder, not specified as traumatic: Secondary | ICD-10-CM

## 2014-02-23 MED ORDER — HYDROCODONE-ACETAMINOPHEN 7.5-325 MG PO TABS: 1.0000 | ORAL_TABLET | Freq: Four times a day (QID) | ORAL | Status: DC | PRN

## 2014-02-23 NOTE — Telephone Encounter (Signed)
Prescription printed and patient made aware to come to office to pick up.  

## 2014-02-23 NOTE — Telephone Encounter (Signed)
Ok to refill??  Last office visit 10/30/2013.  Last refill 01/25/2014.

## 2014-02-23 NOTE — Telephone Encounter (Signed)
[phone]  PT is needing a refill on HYDROcodone-acetaminophen (NORCO) 7.5-325 MG per tablet

## 2014-02-23 NOTE — Telephone Encounter (Signed)
Okay to refill? 

## 2014-03-01 ENCOUNTER — Ambulatory Visit: Payer: Medicaid Other | Admitting: Family Medicine

## 2014-03-08 ENCOUNTER — Encounter: Payer: Self-pay | Admitting: Family Medicine

## 2014-03-08 ENCOUNTER — Ambulatory Visit (INDEPENDENT_AMBULATORY_CARE_PROVIDER_SITE_OTHER): Payer: Medicaid Other | Admitting: Family Medicine

## 2014-03-08 VITALS — BP 152/90 | HR 72 | Temp 97.5°F | Resp 16 | Ht 67.0 in | Wt 231.0 lb

## 2014-03-08 DIAGNOSIS — G8929 Other chronic pain: Secondary | ICD-10-CM

## 2014-03-08 DIAGNOSIS — M75102 Unspecified rotator cuff tear or rupture of left shoulder, not specified as traumatic: Secondary | ICD-10-CM

## 2014-03-08 DIAGNOSIS — I10 Essential (primary) hypertension: Secondary | ICD-10-CM

## 2014-03-08 DIAGNOSIS — J449 Chronic obstructive pulmonary disease, unspecified: Secondary | ICD-10-CM

## 2014-03-08 DIAGNOSIS — E669 Obesity, unspecified: Secondary | ICD-10-CM

## 2014-03-08 MED ORDER — HYDROCODONE-ACETAMINOPHEN 7.5-325 MG PO TABS: 1.0000 | ORAL_TABLET | Freq: Four times a day (QID) | ORAL | Status: DC | PRN

## 2014-03-08 MED ORDER — HYDRALAZINE HCL 25 MG PO TABS: 25.0000 mg | ORAL_TABLET | Freq: Three times a day (TID) | ORAL | Status: DC

## 2014-03-08 NOTE — Patient Instructions (Signed)
REturn in 6 weeks - Morning appt- Fasting Continue current medications

## 2014-03-08 NOTE — Assessment & Plan Note (Addendum)
Blood pressure is elevated her last visit and still quite elevated today. With his recent subdural hematoma of unknown cause of think we need to keep his blood pressure at a good range. I have added hydralazine but did not use beta blocker secondary to his underlying lung disease

## 2014-03-08 NOTE — Progress Notes (Signed)
Patient ID: Vernon MaltaJimmy L Coppock, male   DOB: 04-Nov-1955, 58 y.o.   MRN: [id number]   Subjective:    [name], male    DOB: 04-Nov-1955, 58 y.o.   MRN: [id number]  Patient presents for 4 month F/U and Bleeding in head- [facility]  History of follow chronic medical problems. Since her last visit he was admitted to [facility] secondary to subdural hematoma. This was thought to possibly spontaneous as there was no trauma. He did have some bad headaches and then he blew his nose very hard and noticed blood coming out. He did have his shunt looked at as well and he flushed his shunt there's not been any difficulties with this.  Attention he states he's taken off medications but there is a lot of stress in the family he is been overwhelmed. There no medication changes done at his recent admission.  Diabetes mellitus diet controlled    Gen- fatigue, fever, weight loss,weakness, recent illness HEENT- denies eye drainage, change in vision, nasal discharge, CVS- denies chest pain, palpitations RESP- denies SOB, cough, wheeze ABD- denies N/V, change in stools, abd pain GU- denies dysuria, hematuria, dribbling, incontinence MSK- denies joint pain, muscle aches, injury Neuro- denies headache, dizziness, syncope, seizure activity       Objective:    BP 152/90  Pulse 72  Temp(Src) 97.5 F (36.4 C) (Oral)  Resp 16  Ht 5\' 7"  (1.702 m)  Wt 231 lb (104.781 kg)  BMI 36.17 kg/m2 GEN- NAD, alert and oriented x3 HEENT- PERRL, EOMI, non injected sclera, pink conjunctiva, MMM, oropharynx clear CVS- RRR, no murmur RESP-CTAB Neuro-CNII-XII in tact, no deficits EXT- No edema Pulses- Radial, DP- 2+        Assessment & Plan:      Problem List Items Addressed This Visit   Rotator cuff tear, left - Primary   Relevant Medications      HYDROcodone-acetaminophen (NORCO) 7.5-325 MG per tablet      Note: This dictation was prepared with Dragon dictation along with smaller  phrase technology. Any transcriptional errors that result from this process are unintentional.

## 2014-03-08 NOTE — Assessment & Plan Note (Signed)
Pain medication refills today I did as stated for July secondary to some transportation issues

## 2014-03-08 NOTE — Assessment & Plan Note (Signed)
Currently stable on his inhalers

## 2014-04-03 ENCOUNTER — Other Ambulatory Visit: Payer: Self-pay | Admitting: Family Medicine

## 2014-04-03 DIAGNOSIS — E669 Obesity, unspecified: Secondary | ICD-10-CM

## 2014-04-03 DIAGNOSIS — I10 Essential (primary) hypertension: Secondary | ICD-10-CM

## 2014-04-03 DIAGNOSIS — Z79899 Other long term (current) drug therapy: Secondary | ICD-10-CM

## 2014-04-03 DIAGNOSIS — E119 Type 2 diabetes mellitus without complications: Secondary | ICD-10-CM

## 2014-04-03 DIAGNOSIS — E785 Hyperlipidemia, unspecified: Secondary | ICD-10-CM

## 2014-04-19 ENCOUNTER — Ambulatory Visit: Payer: Medicaid Other | Admitting: Family Medicine

## 2014-04-20 ENCOUNTER — Encounter: Payer: Self-pay | Admitting: Family Medicine

## 2014-04-20 ENCOUNTER — Ambulatory Visit (INDEPENDENT_AMBULATORY_CARE_PROVIDER_SITE_OTHER): Payer: Medicaid Other | Admitting: Family Medicine

## 2014-04-20 VITALS — BP 140/86 | HR 78 | Temp 98.1°F | Resp 14 | Ht 67.0 in | Wt 232.0 lb

## 2014-04-20 DIAGNOSIS — S43429A Sprain of unspecified rotator cuff capsule, initial encounter: Secondary | ICD-10-CM

## 2014-04-20 DIAGNOSIS — I10 Essential (primary) hypertension: Secondary | ICD-10-CM

## 2014-04-20 DIAGNOSIS — E785 Hyperlipidemia, unspecified: Secondary | ICD-10-CM

## 2014-04-20 DIAGNOSIS — Z79899 Other long term (current) drug therapy: Secondary | ICD-10-CM

## 2014-04-20 DIAGNOSIS — M75102 Unspecified rotator cuff tear or rupture of left shoulder, not specified as traumatic: Secondary | ICD-10-CM

## 2014-04-20 DIAGNOSIS — E669 Obesity, unspecified: Secondary | ICD-10-CM

## 2014-04-20 DIAGNOSIS — E119 Type 2 diabetes mellitus without complications: Secondary | ICD-10-CM

## 2014-04-20 LAB — LIPID PANEL
CHOL/HDL RATIO: 3.9 ratio
Cholesterol: 189 mg/dL (ref 0–200)
HDL: 49 mg/dL (ref >39)
LDL Cholesterol: 107 mg/dL — ABNORMAL HIGH (ref 0–99)
Triglycerides: 165 mg/dL — ABNORMAL HIGH (ref <150)
VLDL: 33 mg/dL (ref 0–40)

## 2014-04-20 LAB — COMPREHENSIVE METABOLIC PANEL
ALK PHOS: 67 U/L (ref 39–117)
AST: 16 U/L (ref 0–37)
Albumin: 4.4 g/dL (ref 3.5–5.2)
BILIRUBIN TOTAL: 1 mg/dL (ref 0.2–1.2)
BUN: 17 mg/dL (ref 6–23)
CO2: 31 meq/L (ref 19–32)
Calcium: 9.5 mg/dL (ref 8.4–10.5)
Chloride: 102 mEq/L (ref 96–112)
Creat: 1.3 mg/dL (ref 0.50–1.35)
Glucose, Bld: 74 mg/dL (ref 70–99)
Potassium: 3.8 mEq/L (ref 3.5–5.3)
SODIUM: 143 meq/L (ref 135–145)
TOTAL PROTEIN: 7.6 g/dL (ref 6.0–8.3)

## 2014-04-20 LAB — CBC WITH DIFFERENTIAL/PLATELET
BASOS ABS: 0.1 10*3/uL (ref 0.0–0.1)
Basophils Relative: 1 % (ref 0–1)
EOS ABS: 0.1 10*3/uL (ref 0.0–0.7)
Eosinophils Relative: 1 % (ref 0–5)
HCT: 40.5 % (ref 39.0–52.0)
Hemoglobin: 14 g/dL (ref 13.0–17.0)
Lymphocytes Relative: 35 % (ref 12–46)
Lymphs Abs: 2.3 10*3/uL (ref 0.7–4.0)
MCH: 31.7 pg (ref 26.0–34.0)
MCHC: 34.6 g/dL (ref 30.0–36.0)
MCV: 91.6 fL (ref 78.0–100.0)
Monocytes Absolute: 0.5 10*3/uL (ref 0.1–1.0)
Monocytes Relative: 7 % (ref 3–12)
NEUTROS PCT: 56 % (ref 43–77)
Neutro Abs: 3.7 10*3/uL (ref 1.7–7.7)
PLATELETS: 240 10*3/uL (ref 150–400)
RBC: 4.42 MIL/uL (ref 4.22–5.81)
RDW: 13.5 % (ref 11.5–15.5)
WBC: 6.6 10*3/uL (ref 4.0–10.5)

## 2014-04-20 LAB — HEMOGLOBIN A1C
HEMOGLOBIN A1C: 5.3 % (ref <5.7)
Mean Plasma Glucose: 105 mg/dL (ref <117)

## 2014-04-20 MED ORDER — HYDROCODONE-ACETAMINOPHEN 7.5-325 MG PO TABS: 1.0000 | ORAL_TABLET | Freq: Four times a day (QID) | ORAL | Status: DC | PRN

## 2014-04-20 MED ORDER — SIMVASTATIN 10 MG PO TABS: 10.0000 mg | ORAL_TABLET | Freq: Every evening | ORAL | Status: DC

## 2014-04-20 NOTE — Patient Instructions (Signed)
We will call with lab results Pain medication refilled Continue your medications Check your blood sugar fasting a couple times a week F/U 3 months

## 2014-04-20 NOTE — Assessment & Plan Note (Addendum)
Chronic shoulder pain as well as some back pain. Unable to have surgical intervention. He is on chronic pain medication this was refilled  Note after visit pt came back and stated he did smoke some marijana 2 weeks ago, UDS ordered

## 2014-04-20 NOTE — Assessment & Plan Note (Signed)
Blood pressure much improved will continue current medications.

## 2014-04-20 NOTE — Addendum Note (Signed)
Addended by: Milinda AntisURHAM, Aslee Such F on: 04/20/2014 11:26 AM   Modules accepted: Orders

## 2014-04-20 NOTE — Progress Notes (Signed)
[name], male   DOB: 07/01/1956, 58 y.o.   MRN: [id number]   Subjective:    Patient ID: Vernon MaltaJimmy L Bowler, male    DOB: 07/01/1956, 58 y.o.   MRN: [id number]  Patient presents for 6 week F/U  patient here to followup her medical problems. He's due for fasting labs today. His diabetes mellitus which is diet controlled. He also has hyperlipidemia which he is on simvastatin for. At her last visit his blood pressure was quite elevated. Changes were made to his medications with the addition of hydralazine and he is much improved today. He has no new concerns     Review Of Systems:  GEN- denies fatigue, fever, weight loss,weakness, recent illness HEENT- denies eye drainage, change in vision, nasal discharge, CVS- denies chest pain, palpitations RESP- denies SOB, cough, wheeze ABD- denies N/V, change in stools, abd pain GU- denies dysuria, hematuria, dribbling, incontinence MSK- + joint pain, muscle aches, injury Neuro- denies headache, dizziness, syncope, seizure activity       Objective:    BP 140/86  Pulse 78  Temp(Src) 98.1 F (36.7 C) (Oral)  Resp 14  Ht 5\' 7"  (1.702 m)  Wt 232 lb (105.235 kg)  BMI 36.33 kg/m2 GEN- NAD, alert and oriented x3 HEENT- PERRL, EOMI, non injected sclera, pink conjunctiva, MMM, oropharynx clear CVS- RRR, no murmur RESP-few scattered wheeze, normal WOB, no rhonchi EXT- No edema Pulses- Radial, DP- 2+        Assessment & Plan:      Problem List Items Addressed This Visit   Rotator cuff tear, left - Primary   Relevant Medications      HYDROcodone-acetaminophen (NORCO) 7.5-325 MG per tablet   Obesity, unspecified   HYPERTENSION   Hyperlipidemia   DM   Relevant Orders      HM DIABETES FOOT EXAM (Completed)      Microalbumin / creatinine urine ratio    Other Visit Diagnoses   Essential hypertension, benign        Encounter for long-term (current) use of other medications           Note: This dictation was prepared with  Dragon dictation along with smaller phrase technology. Any transcriptional errors that result from this process are unintentional.

## 2014-04-20 NOTE — Assessment & Plan Note (Signed)
Diabetes is diet controlled we'll repeat his A1c with his fasting labs I've given him a hand written prescription for new glucometer as well as test strips

## 2014-04-21 LAB — MICROALBUMIN / CREATININE URINE RATIO
Creatinine, Urine: 150.8 mg/dL
Microalb Creat Ratio: 22.8 mg/g (ref 0.0–30.0)
Microalb, Ur: 3.44 mg/dL — ABNORMAL HIGH (ref 0.00–1.89)

## 2014-04-22 LAB — OPIATES/OPIOIDS (LC/MS-MS)
CODEINE URINE: NEGATIVE ng/mL (ref <50)
Hydrocodone: 3464 ng/mL — AB (ref <50)
Hydromorphone: 498 ng/mL — AB (ref <50)
Morphine Urine: NEGATIVE ng/mL (ref <50)
Norhydrocodone, Ur: 2448 ng/mL — AB (ref <50)
Noroxycodone, Ur: 1416 ng/mL — AB (ref <50)
OXYMORPHONE, URINE: 753 ng/mL — AB (ref <50)
Oxycodone, ur: 2864 ng/mL — AB (ref <50)

## 2014-04-22 LAB — OXYCODONE, URINE (LC/MS-MS)
NOROXYCODONE, UR: 1416 ng/mL — AB (ref <50)
OXYCODONE, UR: 2864 ng/mL — AB (ref <50)
Oxymorphone: 753 ng/mL — AB (ref <50)

## 2014-04-23 LAB — PRESCRIPTION MONITORING PROFILE (13 PANEL)
AMPHETAMINE/METH: NEGATIVE ng/mL
BARBITURATE SCREEN, URINE: NEGATIVE ng/mL
BENZODIAZEPINE SCREEN, URINE: NEGATIVE ng/mL
BUPRENORPHINE, URINE: NEGATIVE ng/mL
Cannabinoid Scrn, Ur: NEGATIVE ng/mL
Cocaine Metabolites: NEGATIVE ng/mL
Creatinine, Urine: 141.1 mg/dL (ref >20.0)
FENTANYL URINE: NEGATIVE ng/mL
MEPERIDINE UR: NEGATIVE ng/mL
Methadone Screen, Urine: NEGATIVE ng/mL
Nitrites, Initial: NEGATIVE ug/mL
PH URINE, INITIAL: 5.6 pH (ref 4.5–8.9)
PROPOXYPHENE: NEGATIVE ng/mL
Tramadol Scrn, Ur: NEGATIVE ng/mL

## 2014-05-05 ENCOUNTER — Telehealth: Payer: Self-pay | Admitting: *Deleted

## 2014-05-05 NOTE — Telephone Encounter (Signed)
Pt called today wanting lab results that were done on 04/20/14, looked up labwork and pt aware of results

## 2014-05-20 ENCOUNTER — Telehealth: Payer: Self-pay | Admitting: Family Medicine

## 2014-05-20 DIAGNOSIS — M75102 Unspecified rotator cuff tear or rupture of left shoulder, not specified as traumatic: Secondary | ICD-10-CM

## 2014-05-20 NOTE — Telephone Encounter (Signed)
ok 

## 2014-05-20 NOTE — Telephone Encounter (Signed)
Patient is calling for rx for his hydrocodone  Please call him when it is ready he has to arrange for transportation  [phone]

## 2014-05-20 NOTE — Telephone Encounter (Signed)
Ok to refill postdated to 05/25/2014??  Last office visit 04/20/2014.  Last refill 04/25/2014.

## 2014-05-21 MED ORDER — HYDROCODONE-ACETAMINOPHEN 7.5-325 MG PO TABS: 1.0000 | ORAL_TABLET | Freq: Four times a day (QID) | ORAL | Status: DC | PRN

## 2014-05-21 NOTE — Telephone Encounter (Signed)
Med refilled and postdated for 05/25/14, pt has been called

## 2014-05-24 ENCOUNTER — Telehealth: Payer: Self-pay | Admitting: Family Medicine

## 2014-05-24 NOTE — Telephone Encounter (Signed)
Prescription printed and postdated.   Patient aware.

## 2014-05-24 NOTE — Telephone Encounter (Signed)
Patient needs refill on pain medication  [phone]

## 2014-06-21 ENCOUNTER — Telehealth: Payer: Self-pay | Admitting: Family Medicine

## 2014-06-21 DIAGNOSIS — M75102 Unspecified rotator cuff tear or rupture of left shoulder, not specified as traumatic: Secondary | ICD-10-CM

## 2014-06-21 MED ORDER — HYDROCODONE-ACETAMINOPHEN 7.5-325 MG PO TABS: 1.0000 | ORAL_TABLET | Freq: Four times a day (QID) | ORAL | Status: DC | PRN

## 2014-06-21 NOTE — Telephone Encounter (Signed)
[phone] ° °Pt is needing a refill on HYDROcodone-acetaminophen (NORCO) 7.5-325 MG per tablet °

## 2014-06-21 NOTE — Telephone Encounter (Signed)
ok 

## 2014-06-21 NOTE — Telephone Encounter (Signed)
Ok to refill??  Last office visit 04/20/2014.  Last refill 05/25/2014.

## 2014-06-21 NOTE — Telephone Encounter (Signed)
Prescription printed and patient made aware to come to office to pick up.  

## 2014-07-09 ENCOUNTER — Telehealth: Payer: Self-pay | Admitting: Family Medicine

## 2014-07-09 NOTE — Telephone Encounter (Signed)
[phone] [facility] [place]  Pt is wanting a nebulizer machine to take breathing treatments and it has been broken for a while, he had to go to [facility] ER yesterday because his breathing got bad.

## 2014-07-09 NOTE — Telephone Encounter (Signed)
Prescription sent to pharmacy.

## 2014-07-20 ENCOUNTER — Ambulatory Visit (INDEPENDENT_AMBULATORY_CARE_PROVIDER_SITE_OTHER): Payer: Medicaid Other | Admitting: Family Medicine

## 2014-07-20 ENCOUNTER — Encounter: Payer: Self-pay | Admitting: Family Medicine

## 2014-07-20 VITALS — BP 180/98 | HR 72 | Temp 98.3°F | Resp 18 | Ht 67.0 in | Wt 242.0 lb

## 2014-07-20 DIAGNOSIS — J418 Mixed simple and mucopurulent chronic bronchitis: Secondary | ICD-10-CM

## 2014-07-20 DIAGNOSIS — E119 Type 2 diabetes mellitus without complications: Secondary | ICD-10-CM

## 2014-07-20 DIAGNOSIS — G8929 Other chronic pain: Secondary | ICD-10-CM

## 2014-07-20 DIAGNOSIS — Z23 Encounter for immunization: Secondary | ICD-10-CM

## 2014-07-20 DIAGNOSIS — M75102 Unspecified rotator cuff tear or rupture of left shoulder, not specified as traumatic: Secondary | ICD-10-CM

## 2014-07-20 DIAGNOSIS — I10 Essential (primary) hypertension: Secondary | ICD-10-CM

## 2014-07-20 MED ORDER — HYDROCODONE-ACETAMINOPHEN 7.5-325 MG PO TABS: 1.0000 | ORAL_TABLET | Freq: Four times a day (QID) | ORAL | Status: DC | PRN

## 2014-07-20 MED ORDER — ALPRAZOLAM 0.5 MG PO TABS: 0.5000 mg | ORAL_TABLET | Freq: Two times a day (BID) | ORAL | Status: DC | PRN

## 2014-07-20 NOTE — Assessment & Plan Note (Signed)
Pressure severely elevated today however he is extremely anxious about flying for the first time. I've advised him to take another hydralazine helped bring his blood pressure down of also giving him 10 tablets of Xanax to take with him to use on the flight to help calm him down on the way up and also when he returns

## 2014-07-20 NOTE — Patient Instructions (Addendum)
Continue current medications We will call with lab results Flu shot given For today take an extra hydralazine pill  F/U 3 months

## 2014-07-20 NOTE — Assessment & Plan Note (Signed)
Recent exacerbation I will continue him on his current dose of medications. He appears to be back at his baseline. Influenza given

## 2014-07-20 NOTE — Assessment & Plan Note (Signed)
Recheck his A1c this is diet-controlled

## 2014-07-20 NOTE — Progress Notes (Signed)
[name], male   DOB: Jun 26, 1956, 58 y.o.   MRN: [id number]   Subjective:    [name], male    DOB: Jun 26, 1956, 58 y.o.   MRN: [id number]  Patient presents for 3 month F/U  patient to follow chronic medical problems. He is extremely nervous today as he will be fine for the first time just a couple of hours to lead to got to visit some family members and he is very stressed about this. He is taking his blood pressure medicines per report. He does not have his blood glucose log with him today. He was admitted a couple weeks ago to [facility] secondary to COPD exacerbation which she states that he is much improved from. His medications were reviewed. He will be out of town for a few weeks secondary to an ill family member    Review Of Systems:  GEN- denies fatigue, fever, weight loss,weakness, recent illness HEENT- denies eye drainage, change in vision, nasal discharge, CVS- denies chest pain, palpitations RESP- denies SOB, +cough, wheeze ABD- denies N/V, change in stools, abd pain GU- denies dysuria, hematuria, dribbling, incontinence MSK- +joint pain, muscle aches, injury Neuro- denies headache, dizziness, syncope, seizure activity       Objective:    BP 180/98  Pulse 72  Temp(Src) 98.3 F (36.8 C) (Oral)  Resp 18  Ht 5\' 7"  (1.702 m)  Wt 242 lb (109.77 kg)  BMI 37.89 kg/m2 GEN- NAD, alert and oriented x3  BP repeat 180/100 HEENT- PERRL, EOMI, non injected sclera, pink conjunctiva, MMM, oropharynx clear Neck- Supple,  CVS- RRR, no murmur RESP-scattered wheeze, normal WOB EXT- No edema Pulses- Radial, DP- 2+        Assessment & Plan:      Problem List Items Addressed This Visit   Rotator cuff tear, left   Relevant Medications      HYDROcodone-acetaminophen (NORCO) 7.5-325 MG per tablet   Essential hypertension     Pressure severely elevated today however he is extremely anxious about flying for the first time. I've advised him  to take another hydralazine helped bring his blood pressure down of also giving him 10 tablets of Xanax to take with him to use on the flight to help calm him down on the way up and also when he returns    Relevant Orders      CBC with Differential      Comprehensive metabolic panel   Diabetes mellitus type II, controlled     Recheck his A1c this is diet-controlled    Relevant Orders      Hemoglobin A1c      Lipid panel   COPD (chronic obstructive pulmonary disease)     Recent exacerbation I will continue him on his current dose of medications. He appears to be back at his baseline. Influenza given    Chronic pain   Relevant Medications      HYDROcodone-acetaminophen (NORCO) 7.5-325 MG per tablet    Other Visit Diagnoses   Need for prophylactic vaccination and inoculation against influenza    -  Primary    Relevant Orders       Flu Vaccine QUAD 36+ mos PF IM (Fluarix Quad PF) (Completed)       Note: This dictation was prepared with Dragon dictation along with smaller phrase technology. Any transcriptional errors that result from this process are unintentional.

## 2014-07-21 ENCOUNTER — Encounter: Payer: Self-pay | Admitting: *Deleted

## 2014-07-21 ENCOUNTER — Other Ambulatory Visit: Payer: Self-pay | Admitting: *Deleted

## 2014-07-21 ENCOUNTER — Ambulatory Visit: Payer: Medicaid Other | Admitting: Family Medicine

## 2014-07-21 LAB — CBC WITH DIFFERENTIAL/PLATELET
BASOS ABS: 0.1 10*3/uL (ref 0.0–0.1)
BASOS PCT: 1 % (ref 0–1)
EOS ABS: 0.1 10*3/uL (ref 0.0–0.7)
Eosinophils Relative: 1 % (ref 0–5)
HCT: 41.1 % (ref 39.0–52.0)
Hemoglobin: 13.9 g/dL (ref 13.0–17.0)
LYMPHS ABS: 2.2 10*3/uL (ref 0.7–4.0)
Lymphocytes Relative: 27 % (ref 12–46)
MCH: 30.8 pg (ref 26.0–34.0)
MCHC: 33.8 g/dL (ref 30.0–36.0)
MCV: 91.1 fL (ref 78.0–100.0)
Monocytes Absolute: 0.5 10*3/uL (ref 0.1–1.0)
Monocytes Relative: 6 % (ref 3–12)
NEUTROS PCT: 65 % (ref 43–77)
Neutro Abs: 5.3 10*3/uL (ref 1.7–7.7)
PLATELETS: 291 10*3/uL (ref 150–400)
RBC: 4.51 MIL/uL (ref 4.22–5.81)
RDW: 13.4 % (ref 11.5–15.5)
WBC: 8.1 10*3/uL (ref 4.0–10.5)

## 2014-07-21 LAB — LIPID PANEL
CHOL/HDL RATIO: 3.7 ratio
Cholesterol: 188 mg/dL (ref 0–200)
HDL: 51 mg/dL (ref >39)
LDL CALC: 111 mg/dL — AB (ref 0–99)
Triglycerides: 129 mg/dL (ref <150)
VLDL: 26 mg/dL (ref 0–40)

## 2014-07-21 LAB — COMPREHENSIVE METABOLIC PANEL
ALT: 8 U/L (ref 0–53)
AST: 18 U/L (ref 0–37)
Albumin: 4.5 g/dL (ref 3.5–5.2)
Alkaline Phosphatase: 67 U/L (ref 39–117)
BUN: 12 mg/dL (ref 6–23)
CO2: 28 mEq/L (ref 19–32)
Calcium: 9.5 mg/dL (ref 8.4–10.5)
Chloride: 103 mEq/L (ref 96–112)
Creat: 0.93 mg/dL (ref 0.50–1.35)
Glucose, Bld: 92 mg/dL (ref 70–99)
Potassium: 3.9 mEq/L (ref 3.5–5.3)
SODIUM: 141 meq/L (ref 135–145)
TOTAL PROTEIN: 7.4 g/dL (ref 6.0–8.3)
Total Bilirubin: 0.8 mg/dL (ref 0.2–1.2)

## 2014-07-21 LAB — HEMOGLOBIN A1C
Hgb A1c MFr Bld: 5.5 % (ref <5.7)
Mean Plasma Glucose: 111 mg/dL (ref <117)

## 2014-07-21 MED ORDER — SIMVASTATIN 20 MG PO TABS: 20.0000 mg | ORAL_TABLET | Freq: Every evening | ORAL | Status: DC

## 2014-08-18 ENCOUNTER — Telehealth: Payer: Self-pay | Admitting: Family Medicine

## 2014-08-18 DIAGNOSIS — M75102 Unspecified rotator cuff tear or rupture of left shoulder, not specified as traumatic: Secondary | ICD-10-CM

## 2014-08-18 NOTE — Telephone Encounter (Signed)
[phone]  PT is needing a refill on HYDROcodone-acetaminophen (NORCO) 7.5-325 MG per tablet

## 2014-08-23 MED ORDER — HYDROCODONE-ACETAMINOPHEN 7.5-325 MG PO TABS: 1.0000 | ORAL_TABLET | Freq: Four times a day (QID) | ORAL | Status: DC | PRN

## 2014-08-23 NOTE — Telephone Encounter (Signed)
Prescription printed and patient made aware to come to office to pick up.  

## 2014-08-23 NOTE — Telephone Encounter (Signed)
okay

## 2014-08-23 NOTE — Telephone Encounter (Signed)
Ok to refill??  Last office visit/ 07/20/2014.

## 2014-09-20 ENCOUNTER — Telehealth: Payer: Self-pay | Admitting: Family Medicine

## 2014-09-20 DIAGNOSIS — M75102 Unspecified rotator cuff tear or rupture of left shoulder, not specified as traumatic: Secondary | ICD-10-CM

## 2014-09-20 MED ORDER — HYDROCODONE-ACETAMINOPHEN 7.5-325 MG PO TABS: 1.0000 | ORAL_TABLET | Freq: Four times a day (QID) | ORAL | Status: DC | PRN

## 2014-09-20 NOTE — Telephone Encounter (Signed)
Ok to refill??  Last office visit 07/20/2014.  Last refill 08/23/2014.

## 2014-09-20 NOTE — Telephone Encounter (Signed)
[phone]  Pt is needing a refill on HYDROcodone-acetaminophen (NORCO) 7.5-325 MG per tablet

## 2014-09-20 NOTE — Telephone Encounter (Signed)
Okay to refill? 

## 2014-09-20 NOTE — Telephone Encounter (Signed)
Prescription printed and patient made aware to come to office to pick up.  

## 2014-10-27 ENCOUNTER — Encounter: Payer: Self-pay | Admitting: Family Medicine

## 2014-10-27 ENCOUNTER — Ambulatory Visit (INDEPENDENT_AMBULATORY_CARE_PROVIDER_SITE_OTHER): Payer: Medicaid Other | Admitting: Family Medicine

## 2014-10-27 VITALS — BP 160/98 | HR 76 | Temp 98.5°F | Resp 14 | Ht 67.0 in | Wt 241.0 lb

## 2014-10-27 DIAGNOSIS — E669 Obesity, unspecified: Secondary | ICD-10-CM

## 2014-10-27 DIAGNOSIS — G8929 Other chronic pain: Secondary | ICD-10-CM

## 2014-10-27 DIAGNOSIS — J418 Mixed simple and mucopurulent chronic bronchitis: Secondary | ICD-10-CM

## 2014-10-27 DIAGNOSIS — E785 Hyperlipidemia, unspecified: Secondary | ICD-10-CM

## 2014-10-27 DIAGNOSIS — E119 Type 2 diabetes mellitus without complications: Secondary | ICD-10-CM

## 2014-10-27 DIAGNOSIS — M75102 Unspecified rotator cuff tear or rupture of left shoulder, not specified as traumatic: Secondary | ICD-10-CM

## 2014-10-27 DIAGNOSIS — I10 Essential (primary) hypertension: Secondary | ICD-10-CM

## 2014-10-27 MED ORDER — HYDRALAZINE HCL 50 MG PO TABS: 50.0000 mg | ORAL_TABLET | Freq: Three times a day (TID) | ORAL | Status: DC

## 2014-10-27 MED ORDER — TIOTROPIUM BROMIDE MONOHYDRATE 18 MCG IN CAPS: 18.0000 ug | ORAL_CAPSULE | Freq: Every day | RESPIRATORY_TRACT | Status: DC

## 2014-10-27 MED ORDER — HYDROCODONE-ACETAMINOPHEN 7.5-325 MG PO TABS: 1.0000 | ORAL_TABLET | Freq: Four times a day (QID) | ORAL | Status: DC | PRN

## 2014-10-27 MED ORDER — ALBUTEROL SULFATE HFA 108 (90 BASE) MCG/ACT IN AERS: 2.0000 | INHALATION_SPRAY | Freq: Four times a day (QID) | RESPIRATORY_TRACT | Status: DC | PRN

## 2014-10-27 MED ORDER — ALPRAZOLAM 0.5 MG PO TABS: 0.5000 mg | ORAL_TABLET | Freq: Two times a day (BID) | ORAL | Status: DC | PRN

## 2014-10-27 NOTE — Assessment & Plan Note (Signed)
Multiple excerbations, will try him on Symbicort along with his spiriva and albuterol

## 2014-10-27 NOTE — Patient Instructions (Addendum)
Increase your hydralazine to 50mg  three times a day  Continue current medications Pain medication refilled Try the Symbicort instead of the ADVAIR F/U 1 month for blood pressure

## 2014-10-27 NOTE — Assessment & Plan Note (Signed)
Pain meds refilled 

## 2014-10-27 NOTE — Progress Notes (Signed)
[name] Hilgert, male   DOB: [birth year], 59 y.o.   MRN: [id number]   Subjective:    [name] Service, male    DOB: [birth year], 59 y.o.   MRN: [id number]  Patient presents for 3 month F/U Pt here to f/u chronic medical problems  COPD- continues to have exacerbations taking advair and albuterol, [facility] ago for exacerbation.   HTN- taking meds, states things are stressful at home with his mother, denies CP or SOB  DM- diet controlled, last A1C 5.5%  Chronic pain- request refill on script   He is traveling again, request refill on xanax, there is a sick family member he is helping care for    Review Of Systems:  GEN- denies fatigue, fever, weight loss,weakness, recent illness HEENT- denies eye drainage, change in vision, nasal discharge, CVS- denies chest pain, palpitations RESP- denies SOB, +cough, wheeze ABD- denies N/V, change in stools, abd pain GU- denies dysuria, hematuria, dribbling, incontinence MSK- + joint pain, muscle aches, injury Neuro- denies headache, dizziness, syncope, seizure activity       Objective:    BP 160/98 mmHg  Pulse 76  Temp(Src) 98.5 F (36.9 C) (Oral)  Resp 14  Ht 5\' 7"  (1.702 m)  Wt 241 lb (109.317 kg)  BMI 37.74 kg/m2 GEN- NAD, alert and oriented x3 HEENT- PERRL, EOMI, non injected sclera, pink conjunctiva, MMM, oropharynx clear, + rhinorrhea Neck- Supple, no LAD CVS- RRR, no murmur RESP-occasional wheeze, normal WOB EXT- No edema Pulses- Radial, DP- 2+        Assessment & Plan:      Problem List Items Addressed This Visit    None      Note: This dictation was prepared with Dragon dictation along with smaller phrase technology. Any transcriptional errors that result from this process are unintentional.

## 2014-10-27 NOTE — Assessment & Plan Note (Signed)
Diet controlled.  

## 2014-10-27 NOTE — Assessment & Plan Note (Signed)
Uncontrolled, increase hydralazine to 50mg  TID Continue all other meds Prefer not to increase clonidine due to somlenence

## 2014-10-28 LAB — CBC WITH DIFFERENTIAL/PLATELET
BASOS ABS: 0.1 10*3/uL (ref 0.0–0.1)
Basophils Relative: 1 % (ref 0–1)
EOS PCT: 2 % (ref 0–5)
Eosinophils Absolute: 0.1 10*3/uL (ref 0.0–0.7)
HCT: 41.4 % (ref 39.0–52.0)
HEMOGLOBIN: 13.5 g/dL (ref 13.0–17.0)
Lymphocytes Relative: 31 % (ref 12–46)
Lymphs Abs: 2.2 10*3/uL (ref 0.7–4.0)
MCH: 30.5 pg (ref 26.0–34.0)
MCHC: 32.6 g/dL (ref 30.0–36.0)
MCV: 93.5 fL (ref 78.0–100.0)
MPV: 9.2 fL (ref 8.6–12.4)
Monocytes Absolute: 0.6 10*3/uL (ref 0.1–1.0)
Monocytes Relative: 9 % (ref 3–12)
Neutro Abs: 4 10*3/uL (ref 1.7–7.7)
Neutrophils Relative %: 57 % (ref 43–77)
PLATELETS: 293 10*3/uL (ref 150–400)
RBC: 4.43 MIL/uL (ref 4.22–5.81)
RDW: 13.4 % (ref 11.5–15.5)
WBC: 7 10*3/uL (ref 4.0–10.5)

## 2014-10-28 LAB — COMPREHENSIVE METABOLIC PANEL
ALBUMIN: 4.2 g/dL (ref 3.5–5.2)
ALT: <8 U/L (ref 0–53)
AST: 14 U/L (ref 0–37)
Alkaline Phosphatase: 75 U/L (ref 39–117)
BUN: 15 mg/dL (ref 6–23)
CALCIUM: 9.2 mg/dL (ref 8.4–10.5)
CO2: 30 mEq/L (ref 19–32)
Chloride: 103 mEq/L (ref 96–112)
Creat: 0.94 mg/dL (ref 0.50–1.35)
Glucose, Bld: 95 mg/dL (ref 70–99)
Potassium: 4 mEq/L (ref 3.5–5.3)
SODIUM: 142 meq/L (ref 135–145)
Total Bilirubin: 0.5 mg/dL (ref 0.2–1.2)
Total Protein: 7.4 g/dL (ref 6.0–8.3)

## 2014-10-28 LAB — LIPID PANEL
CHOL/HDL RATIO: 3.3 ratio
Cholesterol: 159 mg/dL (ref 0–200)
HDL: 48 mg/dL (ref >39)
LDL Cholesterol: 88 mg/dL (ref 0–99)
Triglycerides: 113 mg/dL (ref <150)
VLDL: 23 mg/dL (ref 0–40)

## 2014-10-29 ENCOUNTER — Encounter: Payer: Self-pay | Admitting: *Deleted

## 2014-11-26 ENCOUNTER — Ambulatory Visit (INDEPENDENT_AMBULATORY_CARE_PROVIDER_SITE_OTHER): Payer: Medicaid Other | Admitting: Family Medicine

## 2014-11-26 ENCOUNTER — Encounter: Payer: Self-pay | Admitting: Family Medicine

## 2014-11-26 VITALS — BP 158/78 | HR 76 | Temp 97.7°F | Resp 18 | Ht 67.0 in | Wt 238.0 lb

## 2014-11-26 DIAGNOSIS — I10 Essential (primary) hypertension: Secondary | ICD-10-CM | POA: Diagnosis not present

## 2014-11-26 DIAGNOSIS — M75102 Unspecified rotator cuff tear or rupture of left shoulder, not specified as traumatic: Secondary | ICD-10-CM | POA: Diagnosis not present

## 2014-11-26 MED ORDER — HYDROCODONE-ACETAMINOPHEN 7.5-325 MG PO TABS
1.0000 | ORAL_TABLET | Freq: Four times a day (QID) | ORAL | Status: DC | PRN
Start: 2014-11-26 — End: 2014-12-21

## 2014-11-26 NOTE — Patient Instructions (Signed)
Continue current medications We will call in 2 weeks for your home readings F/U 3 months

## 2014-11-28 NOTE — Progress Notes (Signed)
[name], male   DOB: [birth year], 59 y.o.   MRN: [id number]   Subjective:    Patient ID: Vernon MaltaJimmy L Postma, male    DOB: [birth year], 59 y.o.   MRN: [id number]  Patient presents for 1 month F/U- HTN  Pt here for intermin f/u visit on HTN. Today he found out he is being evicted from his apartment. He does have some places in mind where he can stay.  He did not bring his meds, states his BP was running good at home. No concerns Requested pain medication refill    Review Of Systems:  GEN- denies fatigue, fever, weight loss,weakness, recent illness HEENT- denies eye drainage, change in vision, nasal discharge, CVS- denies chest pain, palpitations RESP- denies SOB, cough, wheeze ABD- denies N/V, change in stools, abd pain Neuro- denies headache, dizziness, syncope, seizure activity       Objective:    BP 158/78 mmHg  Pulse 76  Temp(Src) 97.7 F (36.5 C) (Oral)  Resp 18  Ht 5\' 7"  (1.702 m)  Wt 238 lb (107.956 kg)  BMI 37.27 kg/m2 GEN- NAD, alert and oriented x3 HEENT- PERRL, EOMI, non injected sclera, pink conjunctiva, MMM, oropharynx clear CVS- RRR, no murmur RESP-CTAB EXT- No edema Pulses- Radial 2+        Assessment & Plan:      Problem List Items Addressed This Visit      Unprioritized   Rotator cuff tear, left   Relevant Medications   HYDROcodone-acetaminophen (NORCO) 7.5-325 MG per tablet   Essential hypertension - Primary      Note: This dictation was prepared with Dragon dictation along with smaller phrase technology. Any transcriptional errors that result from this process are unintentional.

## 2014-11-28 NOTE — Assessment & Plan Note (Signed)
I can not determine if he is taking all the meds, he is a little upset today, has no prescription bottles with him Systolic is only 10 points better He is on maximum therapy with exception of adding aldactone which I am reluctant to do today not know if he is taking the rest Continue at current doses  Refilled pain meds today. Not he did ask for more xanax, which is odd as he stated he was only using to fly, I declined this, I am concerned about overuse of benzos and he is alreay on pain meds

## 2014-12-21 ENCOUNTER — Telehealth: Payer: Self-pay | Admitting: Family Medicine

## 2014-12-21 DIAGNOSIS — M75102 Unspecified rotator cuff tear or rupture of left shoulder, not specified as traumatic: Secondary | ICD-10-CM

## 2014-12-21 MED ORDER — HYDROCODONE-ACETAMINOPHEN 7.5-325 MG PO TABS: 1.0000 | ORAL_TABLET | Freq: Four times a day (QID) | ORAL | Status: DC | PRN

## 2014-12-21 NOTE — Telephone Encounter (Signed)
Pt knows hes not due till next week for his medication but he has transportation for possibly tomorrow to be able to come and get it. Is there anyway that he could get it tomorrow if so just call and let him know  Pt is needing a refill on HYDROcodone-acetaminophen (NORCO) 7.5-325 MG per tablet [phone]

## 2014-12-21 NOTE — Telephone Encounter (Signed)
Okay to refill? 

## 2014-12-21 NOTE — Telephone Encounter (Signed)
Ok to refill??  Last office visit/ refill 11/26/2014.

## 2014-12-21 NOTE — Telephone Encounter (Signed)
Prescription printed and patient made aware to come to office to pick up.  

## 2015-01-14 ENCOUNTER — Telehealth: Payer: Self-pay | Admitting: Family Medicine

## 2015-01-14 DIAGNOSIS — M75102 Unspecified rotator cuff tear or rupture of left shoulder, not specified as traumatic: Secondary | ICD-10-CM

## 2015-01-14 MED ORDER — HYDROCODONE-ACETAMINOPHEN 7.5-325 MG PO TABS: 1.0000 | ORAL_TABLET | Freq: Four times a day (QID) | ORAL | Status: DC | PRN

## 2015-01-14 NOTE — Telephone Encounter (Signed)
Okay to refill? 

## 2015-01-14 NOTE — Telephone Encounter (Signed)
Prescription printed and patient made aware to come to office to pick up.  

## 2015-01-14 NOTE — Telephone Encounter (Signed)
Ok to refill??  Last office visit 11/26/2014.  Last refill 12/26/2014.

## 2015-01-14 NOTE — Telephone Encounter (Signed)
Patient is calling to get refill on his hydrocodone to pick up next week  Call him at [phone] when ready

## 2015-01-18 ENCOUNTER — Telehealth: Payer: Self-pay | Admitting: Family Medicine

## 2015-01-18 MED ORDER — TIOTROPIUM BROMIDE MONOHYDRATE 18 MCG IN CAPS: 18.0000 ug | ORAL_CAPSULE | Freq: Every day | RESPIRATORY_TRACT | Status: DC

## 2015-01-18 MED ORDER — ALBUTEROL SULFATE HFA 108 (90 BASE) MCG/ACT IN AERS: 2.0000 | INHALATION_SPRAY | Freq: Four times a day (QID) | RESPIRATORY_TRACT | Status: DC | PRN

## 2015-01-18 MED ORDER — FLUTICASONE-SALMETEROL 500-50 MCG/DOSE IN AEPB: 1.0000 | INHALATION_SPRAY | Freq: Two times a day (BID) | RESPIRATORY_TRACT | Status: DC

## 2015-01-18 MED ORDER — BUDESONIDE-FORMOTEROL FUMARATE 160-4.5 MCG/ACT IN AERO: 2.0000 | INHALATION_SPRAY | Freq: Two times a day (BID) | RESPIRATORY_TRACT | Status: DC

## 2015-01-18 NOTE — Telephone Encounter (Signed)
Call placed to patient.   States that he requires refills on all inhalers.   Advised that patient should F/U with office since he is having issues with COPD/ HTN. Appointment scheduled for next week per patient request.   ER records requested.   Prescription sent to pharmacy.

## 2015-01-18 NOTE — Telephone Encounter (Signed)
[phone] [facility] several times here recently for his bronchitis/copd/asthma Pt states that yesterday when he was there that he's BP was very high (did not remember what it was exactly) He is wanting an inhaler called in.

## 2015-01-18 NOTE — Telephone Encounter (Signed)
Noted, he needs to bring all his medication with him

## 2015-01-25 ENCOUNTER — Ambulatory Visit: Payer: Medicaid Other | Admitting: Family Medicine

## 2015-01-31 ENCOUNTER — Ambulatory Visit: Payer: Medicaid Other | Admitting: Family Medicine

## 2015-02-01 ENCOUNTER — Ambulatory Visit: Payer: Medicaid Other | Admitting: Family Medicine

## 2015-02-22 ENCOUNTER — Telehealth: Payer: Self-pay | Admitting: Family Medicine

## 2015-02-22 DIAGNOSIS — M75102 Unspecified rotator cuff tear or rupture of left shoulder, not specified as traumatic: Secondary | ICD-10-CM

## 2015-02-22 MED ORDER — HYDROCODONE-ACETAMINOPHEN 7.5-325 MG PO TABS: 1.0000 | ORAL_TABLET | Freq: Four times a day (QID) | ORAL | Status: DC | PRN

## 2015-02-22 NOTE — Telephone Encounter (Signed)
[phone] Pt is needing a refill on HYDROcodone-acetaminophen (NORCO) 7.5-325 MG per tablet

## 2015-02-22 NOTE — Telephone Encounter (Signed)
Okay to refill but pt needs to leave random urine for drug screen

## 2015-02-22 NOTE — Telephone Encounter (Signed)
Ok to refill??  Last office visit 11/26/2014.  Last refill 01/25/2015.

## 2015-02-22 NOTE — Telephone Encounter (Signed)
Prescription printed.   Call placed to patient. No answer. No VM. 

## 2015-02-23 NOTE — Telephone Encounter (Signed)
Call placed to patient and patient made aware.   Patient states that he has appointment on Friday and will pick up at that time.   Urine specimen will be collected at that time as well.

## 2015-02-25 ENCOUNTER — Ambulatory Visit (INDEPENDENT_AMBULATORY_CARE_PROVIDER_SITE_OTHER): Payer: Medicaid Other | Admitting: Family Medicine

## 2015-02-28 ENCOUNTER — Ambulatory Visit: Payer: Medicaid Other | Admitting: Family Medicine

## 2015-03-03 ENCOUNTER — Telehealth: Payer: Self-pay | Admitting: Family Medicine

## 2015-03-03 NOTE — Telephone Encounter (Signed)
MD please advise

## 2015-03-03 NOTE — Telephone Encounter (Addendum)
Patient calling because he needs a letter from dr Jeanice Lim faxed to [place] of [employer], regarding his health problems - so they can help him with his electric bill; please fax to [phone]

## 2015-03-04 ENCOUNTER — Ambulatory Visit: Payer: Medicaid Other | Admitting: Family Medicine

## 2015-03-04 NOTE — Telephone Encounter (Signed)
He needs to discuss at OV, I do not provide this unless patients are on oxygen therapy

## 2015-03-04 NOTE — Telephone Encounter (Signed)
Call placed to patient and patient made aware.  

## 2015-03-08 ENCOUNTER — Encounter: Payer: Self-pay | Admitting: Family Medicine

## 2015-03-08 ENCOUNTER — Ambulatory Visit (INDEPENDENT_AMBULATORY_CARE_PROVIDER_SITE_OTHER): Payer: Medicaid Other | Admitting: Family Medicine

## 2015-03-08 VITALS — BP 172/104 | HR 72 | Temp 97.8°F | Resp 14 | Ht 67.0 in | Wt 232.0 lb

## 2015-03-08 DIAGNOSIS — E669 Obesity, unspecified: Secondary | ICD-10-CM | POA: Diagnosis not present

## 2015-03-08 DIAGNOSIS — I10 Essential (primary) hypertension: Secondary | ICD-10-CM | POA: Diagnosis not present

## 2015-03-08 DIAGNOSIS — E119 Type 2 diabetes mellitus without complications: Secondary | ICD-10-CM | POA: Diagnosis not present

## 2015-03-08 DIAGNOSIS — G8929 Other chronic pain: Secondary | ICD-10-CM

## 2015-03-08 DIAGNOSIS — Z79899 Other long term (current) drug therapy: Secondary | ICD-10-CM

## 2015-03-08 DIAGNOSIS — J418 Mixed simple and mucopurulent chronic bronchitis: Secondary | ICD-10-CM | POA: Diagnosis not present

## 2015-03-08 LAB — CBC WITH DIFFERENTIAL/PLATELET
BASOS ABS: 0.1 10*3/uL (ref 0.0–0.1)
Basophils Relative: 1 % (ref 0–1)
Eosinophils Absolute: 0.1 10*3/uL (ref 0.0–0.7)
Eosinophils Relative: 2 % (ref 0–5)
HCT: 43 % (ref 39.0–52.0)
Hemoglobin: 14 g/dL (ref 13.0–17.0)
LYMPHS PCT: 33 % (ref 12–46)
Lymphs Abs: 2 10*3/uL (ref 0.7–4.0)
MCH: 31 pg (ref 26.0–34.0)
MCHC: 32.6 g/dL (ref 30.0–36.0)
MCV: 95.1 fL (ref 78.0–100.0)
MPV: 9.6 fL (ref 8.6–12.4)
Monocytes Absolute: 0.6 10*3/uL (ref 0.1–1.0)
Monocytes Relative: 10 % (ref 3–12)
NEUTROS PCT: 54 % (ref 43–77)
Neutro Abs: 3.2 10*3/uL (ref 1.7–7.7)
Platelets: 249 10*3/uL (ref 150–400)
RBC: 4.52 MIL/uL (ref 4.22–5.81)
RDW: 13.3 % (ref 11.5–15.5)
WBC: 6 10*3/uL (ref 4.0–10.5)

## 2015-03-08 LAB — COMPREHENSIVE METABOLIC PANEL
ALBUMIN: 4.6 g/dL (ref 3.5–5.2)
AST: 20 U/L (ref 0–37)
Alkaline Phosphatase: 65 U/L (ref 39–117)
BUN: 13 mg/dL (ref 6–23)
CALCIUM: 9.4 mg/dL (ref 8.4–10.5)
CHLORIDE: 98 meq/L (ref 96–112)
CO2: 33 meq/L — AB (ref 19–32)
Creat: 1.16 mg/dL (ref 0.50–1.35)
GLUCOSE: 85 mg/dL (ref 70–99)
POTASSIUM: 3.2 meq/L — AB (ref 3.5–5.3)
SODIUM: 140 meq/L (ref 135–145)
TOTAL PROTEIN: 7.7 g/dL (ref 6.0–8.3)
Total Bilirubin: 1.1 mg/dL (ref 0.2–1.2)

## 2015-03-08 LAB — LIPID PANEL
Cholesterol: 176 mg/dL (ref 0–200)
HDL: 47 mg/dL (ref >=40)
LDL CALC: 95 mg/dL (ref 0–99)
Total CHOL/HDL Ratio: 3.7 Ratio
Triglycerides: 168 mg/dL — ABNORMAL HIGH (ref <150)
VLDL: 34 mg/dL (ref 0–40)

## 2015-03-08 NOTE — Patient Instructions (Signed)
Take your medications as prescribed Take the inhalers for your breathing F/U 4 months

## 2015-03-09 LAB — HEMOGLOBIN A1C
HEMOGLOBIN A1C: 5.4 % (ref <5.7)
Mean Plasma Glucose: 108 mg/dL (ref <117)

## 2015-03-09 MED ORDER — CLONIDINE HCL 0.2 MG PO TABS: 0.2000 mg | ORAL_TABLET | Freq: Two times a day (BID) | ORAL | Status: DC

## 2015-03-09 MED ORDER — TIOTROPIUM BROMIDE MONOHYDRATE 18 MCG IN CAPS: 18.0000 ug | ORAL_CAPSULE | Freq: Every day | RESPIRATORY_TRACT | Status: DC

## 2015-03-09 MED ORDER — ALBUTEROL SULFATE HFA 108 (90 BASE) MCG/ACT IN AERS: 2.0000 | INHALATION_SPRAY | Freq: Four times a day (QID) | RESPIRATORY_TRACT | Status: DC | PRN

## 2015-03-09 MED ORDER — SIMVASTATIN 20 MG PO TABS: 20.0000 mg | ORAL_TABLET | Freq: Every evening | ORAL | Status: DC

## 2015-03-09 NOTE — Assessment & Plan Note (Signed)
I did not plan to check UDS as he was already aware I was checking from last visit, but after his statesments about his son will send off

## 2015-03-09 NOTE — Assessment & Plan Note (Signed)
Diet controlled, no change to meds 

## 2015-03-09 NOTE — Progress Notes (Signed)
[name], male   DOB: 1955-10-25, 59 y.o.   MRN: [id number]   Subjective:    [name], male    DOB: 1956-04-09, 59 y.o.   MRN: [id number]  Patient presents for 3 month F/U  A she had a follow-up his medications. Unfortunately he did not bring his medicines with him and by the end of the visit he has been honest and states that he has not been taking his blood pressure medicine regularly and has not had his inhalers in the past couple months. He was treated for COPD exacerbation by [facility] a few months back. He states that he is under a lot of stress with his mother and that his finances are tight and therefore he does not have enough money each month to get his medications. He is getting his pain medication regularly though.  He also states that his son has been living with him and it is been very stressful and that he smokes marijuana the house so if anything comes up and his drug screen is because his son has been there.    Review Of Systems:  GEN- denies fatigue, fever, weight loss,weakness, recent illness HEENT- denies eye drainage, change in vision, nasal discharge, CVS- denies chest pain, palpitations RESP- denies SOB, cough, wheeze ABD- denies N/V, change in stools, abd pain GU- denies dysuria, hematuria, dribbling, incontinence MSK-+ joint pain, muscle aches, injury Neuro- denies headache, dizziness, syncope, seizure activity       Objective:    BP 172/104 mmHg  Pulse 72  Temp(Src) 97.8 F (36.6 C) (Oral)  Resp 14  Ht 5\' 7"  (1.702 m)  Wt 232 lb (105.235 kg)  BMI 36.33 kg/m2 GEN- NAD, alert and oriented x3 HEENT- PERRL, EOMI, non injected sclera, pink conjunctiva, MMM, oropharynx clear CVS- RRR, no murmur RESP-few scattered wheeze, normal WOB EXT- No edema Pulses- Radial,- 2+        Assessment & Plan:      Problem List Items Addressed This Visit    Obesity   Essential hypertension   Relevant Orders   Lipid panel  (Completed)   Diabetes mellitus type II, controlled - Primary   Relevant Orders   CBC with Differential/Platelet (Completed)   Comprehensive metabolic panel (Completed)   Lipid panel (Completed)   Hemoglobin A1c (Completed)   COPD (chronic obstructive pulmonary disease)   Chronic pain    Other Visit Diagnoses    Long-term use of high-risk medication        Relevant Orders    Prescript Monitor Profile(13)       Note: This dictation was prepared with Dragon dictation along with smaller phrase technology. Any transcriptional errors that result from this process are unintentional.

## 2015-03-09 NOTE — Assessment & Plan Note (Signed)
Uncontrolled, due to non compliance with meds, previous visits always statse he has his meds, so I am concerned about his honesty with taking them when he comes in regulary to get his pain meds and he is on government subsidy

## 2015-03-09 NOTE — Assessment & Plan Note (Signed)
Given Spiriva Respimat from office and Symbircort sample

## 2015-03-12 LAB — OPIATES/OPIOIDS (LC/MS-MS)
CODEINE URINE: NEGATIVE ng/mL (ref <50)
Hydrocodone: 713 ng/mL — AB (ref <50)
Hydromorphone: 183 ng/mL — AB (ref <50)
Morphine Urine: NEGATIVE ng/mL (ref <50)
NORHYDROCODONE, UR: 787 ng/mL — AB (ref <50)
Noroxycodone, Ur: 1990 ng/mL — AB (ref <50)
OXYCODONE, UR: 2786 ng/mL — AB (ref <50)
OXYMORPHONE, URINE: 616 ng/mL — AB (ref <50)

## 2015-03-12 LAB — OXYCODONE, URINE (LC/MS-MS)
Noroxycodone, Ur: 1990 ng/mL — AB (ref <50)
OXYMORPHONE, URINE: 616 ng/mL — AB (ref <50)
Oxycodone, ur: 2786 ng/mL — AB (ref <50)

## 2015-03-15 LAB — PRESCRIPTION MONITORING PROFILE (13 PANEL)
Amphetamine/Meth: NEGATIVE ng/mL
BARBITURATE SCREEN, URINE: NEGATIVE ng/mL
BENZODIAZEPINE SCREEN, URINE: NEGATIVE ng/mL
BUPRENORPHINE, URINE: NEGATIVE ng/mL
CANNABINOID SCRN UR: NEGATIVE ng/mL
COCAINE METABOLITES: NEGATIVE ng/mL
Creatinine, Urine: 142.26 mg/dL (ref >20.0)
Fentanyl, Ur: NEGATIVE ng/mL
MEPERIDINE UR: NEGATIVE ng/mL
METHADONE SCREEN, URINE: NEGATIVE ng/mL
Nitrites, Initial: NEGATIVE ug/mL
Propoxyphene: NEGATIVE ng/mL
TRAMADOL UR: NEGATIVE ng/mL
pH, Initial: 6.9 pH (ref 4.5–8.9)

## 2015-03-17 ENCOUNTER — Telehealth: Payer: Self-pay | Admitting: *Deleted

## 2015-03-17 NOTE — Telephone Encounter (Signed)
Received fax from [facility] in [place], [place] [phone].  Reports that patient has requested PCS services.   MD please advise.

## 2015-03-17 NOTE — Telephone Encounter (Signed)
Denied, does not qualify

## 2015-03-17 NOTE — Telephone Encounter (Signed)
PCS provider may aware.

## 2015-03-21 ENCOUNTER — Telehealth: Payer: Self-pay | Admitting: Family Medicine

## 2015-03-21 DIAGNOSIS — M75102 Unspecified rotator cuff tear or rupture of left shoulder, not specified as traumatic: Secondary | ICD-10-CM

## 2015-03-21 MED ORDER — HYDROCODONE-ACETAMINOPHEN 7.5-325 MG PO TABS: 1.0000 | ORAL_TABLET | Freq: Four times a day (QID) | ORAL | Status: DC | PRN

## 2015-03-21 NOTE — Telephone Encounter (Signed)
Patient is calling to get rx for his hydrocodone  [phone] when ready

## 2015-03-21 NOTE — Telephone Encounter (Signed)
Okay to refill? 

## 2015-03-21 NOTE — Telephone Encounter (Signed)
Prescription printed and patient made aware to come to office to pick up on 03/22/2015. 

## 2015-03-21 NOTE — Telephone Encounter (Signed)
Ok to refill??  Last office visit 03/08/2015.   Last refill 02/24/2015.

## 2015-03-22 ENCOUNTER — Telehealth: Payer: Self-pay | Admitting: Family Medicine

## 2015-03-22 NOTE — Telephone Encounter (Signed)
noted 

## 2015-03-22 NOTE — Telephone Encounter (Signed)
Pt picked up Rx for Hydrocodone.  It said "Do not fill until 03/26/15"  Pt at drug store.  Is leaving to go out of town.  Pharmacist needs OK to fill now so he can take with him.  OK given.  Pt told this will not change due date for next refill.  Pharmacist told the same.

## 2015-04-22 ENCOUNTER — Telehealth: Payer: Self-pay | Admitting: Family Medicine

## 2015-04-22 NOTE — Telephone Encounter (Signed)
Please request records from [facility]. (to [place])
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
## 2015-04-23 ENCOUNTER — Emergency Department (HOSPITAL_COMMUNITY): Payer: Medicaid Other

## 2015-04-23 ENCOUNTER — Emergency Department (HOSPITAL_COMMUNITY)
Admission: EM | Admit: 2015-04-23 | Discharge: 2015-04-23 | Disposition: A | Discharge status: Home / Self Care | Payer: Medicaid Other | Attending: Emergency Medicine | Admitting: Emergency Medicine

## 2015-04-23 ENCOUNTER — Encounter (HOSPITAL_COMMUNITY): Payer: Self-pay | Admitting: *Deleted

## 2015-04-23 DIAGNOSIS — I10 Essential (primary) hypertension: Secondary | ICD-10-CM | POA: Diagnosis not present

## 2015-04-23 DIAGNOSIS — S3991XA Unspecified injury of abdomen, initial encounter: Secondary | ICD-10-CM | POA: Insufficient documentation

## 2015-04-23 DIAGNOSIS — Y998 Other external cause status: Secondary | ICD-10-CM | POA: Insufficient documentation

## 2015-04-23 DIAGNOSIS — Z7951 Long term (current) use of inhaled steroids: Secondary | ICD-10-CM | POA: Diagnosis not present

## 2015-04-23 DIAGNOSIS — Z79899 Other long term (current) drug therapy: Secondary | ICD-10-CM | POA: Insufficient documentation

## 2015-04-23 DIAGNOSIS — S2232XA Fracture of one rib, left side, initial encounter for closed fracture: Secondary | ICD-10-CM | POA: Diagnosis not present

## 2015-04-23 DIAGNOSIS — E785 Hyperlipidemia, unspecified: Secondary | ICD-10-CM | POA: Insufficient documentation

## 2015-04-23 DIAGNOSIS — Y9389 Activity, other specified: Secondary | ICD-10-CM | POA: Diagnosis not present

## 2015-04-23 DIAGNOSIS — J441 Chronic obstructive pulmonary disease with (acute) exacerbation: Secondary | ICD-10-CM | POA: Insufficient documentation

## 2015-04-23 DIAGNOSIS — W109XXA Fall (on) (from) unspecified stairs and steps, initial encounter: Secondary | ICD-10-CM | POA: Diagnosis not present

## 2015-04-23 DIAGNOSIS — Z87438 Personal history of other diseases of male genital organs: Secondary | ICD-10-CM | POA: Diagnosis not present

## 2015-04-23 DIAGNOSIS — Y9289 Other specified places as the place of occurrence of the external cause: Secondary | ICD-10-CM | POA: Insufficient documentation

## 2015-04-23 DIAGNOSIS — I5030 Unspecified diastolic (congestive) heart failure: Secondary | ICD-10-CM | POA: Insufficient documentation

## 2015-04-23 DIAGNOSIS — E119 Type 2 diabetes mellitus without complications: Secondary | ICD-10-CM | POA: Insufficient documentation

## 2015-04-23 DIAGNOSIS — S29001A Unspecified injury of muscle and tendon of front wall of thorax, initial encounter: Secondary | ICD-10-CM | POA: Diagnosis present

## 2015-04-23 LAB — URINALYSIS, ROUTINE W REFLEX MICROSCOPIC
BILIRUBIN URINE: NEGATIVE
Glucose, UA: NEGATIVE mg/dL
Hgb urine dipstick: NEGATIVE
KETONES UR: NEGATIVE mg/dL
LEUKOCYTES UA: NEGATIVE
Nitrite: NEGATIVE
PH: 6 (ref 5.0–8.0)
PROTEIN: NEGATIVE mg/dL
Specific Gravity, Urine: 1.015 (ref 1.005–1.030)
UROBILINOGEN UA: 0.2 mg/dL (ref 0.0–1.0)

## 2015-04-23 MED ORDER — IBUPROFEN 800 MG PO TABS
800.0000 mg | ORAL_TABLET | Freq: Once | ORAL | Status: AC
  Administered 2015-04-23: 800 mg via ORAL
  Filled 2015-04-23: qty 1

## 2015-04-23 MED ORDER — OXYCODONE HCL 5 MG PO TABS: 5.0000 mg | ORAL_TABLET | ORAL | Status: DC | PRN

## 2015-04-23 MED ORDER — ACETAMINOPHEN 500 MG PO TABS
1000.0000 mg | ORAL_TABLET | Freq: Once | ORAL | Status: AC
  Administered 2015-04-23: 1000 mg via ORAL
  Filled 2015-04-23: qty 2

## 2015-04-23 MED ORDER — IPRATROPIUM-ALBUTEROL 0.5-2.5 (3) MG/3ML IN SOLN
3.0000 mL | RESPIRATORY_TRACT | Status: AC
  Administered 2015-04-23 (×3): 3 mL via RESPIRATORY_TRACT
  Filled 2015-04-23 (×3): qty 3

## 2015-04-23 MED ORDER — OXYCODONE HCL 5 MG PO TABS
5.0000 mg | ORAL_TABLET | Freq: Once | ORAL | Status: AC
Start: 2015-04-23 — End: 2015-04-23
  Administered 2015-04-23: 5 mg via ORAL
  Filled 2015-04-23: qty 1

## 2015-04-23 NOTE — ED Provider Notes (Signed)
CSN: [id number]     Arrival date & time 04/23/15  1839 History   First MD Initiated Contact with Patient 04/23/15 1853     Chief Complaint  Patient presents with  . Shortness of Breath  . Fall     (Consider location/radiation/quality/duration/timing/severity/associated sxs/prior Treatment) Patient is a 59 y.o. male presenting with fall. The history is provided by the patient.  Fall This is a new problem. The current episode started more than 2 days ago. The problem occurs constantly. The problem has been gradually worsening. Associated symptoms include chest pain. Pertinent negatives include no abdominal pain, no headaches and no shortness of breath. The symptoms are aggravated by twisting, coughing and bending. Nothing relieves the symptoms. He has tried a cold compress for the symptoms. The treatment provided no relief.   59 yo M with a chief complaint of left-sided chest pain. Patient states this started 5 days ago when he tripped over his flip-flops and fell down a flight of stairs. Since then patient has had this pain worsening with deep breaths movement palpation. Patient denies fevers or chills. Patient has a history of COPD and has had a mild cough with this denies increased sputum production denies change in sputum.  Past Medical History  Diagnosis Date  . Diabetes mellitus   . Hypertension   . Asthma   . COPD (chronic obstructive pulmonary disease)   . Hyperlipidemia   . Headache(784.0)   . BPH (benign prostatic hyperplasia)   . Diastolic CHF   . Substance abuse     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 Cocaine   Past Surgical History  Procedure Laterality Date  . Brain surgery    . Ventriculoperitoneal shunt    . Colon surgery  06/2011    Diverticulitis Complicated  . Mandible surgery     Family History  Problem Relation Age of Onset  . Hypertension Mother   . Hyperlipidemia Mother   . Depression Mother   . Hypertension Father   . Hyperlipidemia Father   . Diabetes Father   .  Arthritis    . Asthma     History  Substance Use Topics  . Smoking status: Never Smoker   . Smokeless tobacco: Never Used  . Alcohol Use: No    Review of Systems  Constitutional: Negative for fever and chills.  HENT: Negative for congestion and facial swelling.   Eyes: Negative for discharge and visual disturbance.  Respiratory: Negative for shortness of breath.   Cardiovascular: Positive for chest pain. Negative for palpitations.  Gastrointestinal: Negative for vomiting, abdominal pain and diarrhea.  Musculoskeletal: Negative for myalgias and arthralgias.  Skin: Negative for color change and rash.  Neurological: Negative for tremors, syncope and headaches.  Psychiatric/Behavioral: Negative for confusion and dysphoric mood.      Allergies  Morphine  Home Medications   Prior to Admission medications   Medication Sig Start Date End Date Taking? Authorizing Provider  albuterol (PROVENTIL HFA;VENTOLIN HFA) 108 (90 BASE) MCG/ACT inhaler Inhale 2 puffs into the lungs every 6 (six) hours as needed for wheezing. 03/09/15  Yes Salley Scarlet, MD  albuterol (PROVENTIL) (2.5 MG/3ML) 0.083% nebulizer solution Take 2.5 mg by nebulization every 6 (six) hours as needed for wheezing.   Yes Historical Provider, MD  amLODipine (NORVASC) 10 MG tablet Take 1 tablet (10 mg total) by mouth daily. 10/30/13  Yes Salley Scarlet, MD  budesonide-formoterol [facility]) 160-4.5 MCG/ACT inhaler Inhale 2 puffs into the lungs 2 (two) times daily. 01/18/15  Yes Kingsley Spittle  Claria Dice, MD  cloNIDine (CATAPRES) 0.2 MG tablet Take 1 tablet (0.2 mg total) by mouth 2 (two) times daily. 03/09/15  Yes Salley Scarlet, MD  hydrALAZINE (APRESOLINE) 50 MG tablet Take 1 tablet (50 mg total) by mouth 3 (three) times daily. 10/27/14  Yes Salley Scarlet, MD  hydrochlorothiazide (HYDRODIURIL) 25 MG tablet Take 1 tablet (25 mg total) by mouth daily. 11/17/12  Yes Salley Scarlet, MD  HYDROcodone-acetaminophen (NORCO) 7.5-325 MG per  tablet Take 1 tablet by mouth every 6 (six) hours as needed. Pain 03/21/15  Yes Salley Scarlet, MD  Multiple Vitamin (MULTIVITAMIN WITH MINERALS) TABS Take 1 tablet by mouth daily.   Yes Historical Provider, MD  simvastatin (ZOCOR) 20 MG tablet Take 1 tablet (20 mg total) by mouth every evening. 03/09/15 03/08/16 Yes Salley Scarlet, MD  glucose blood (ACCU-CHEK AVIVA PLUS) test strip USE TO TEST BLOOD SUGAR ONCE DAILY. DX: 250.00 01/25/14   Salley Scarlet, MD  lisinopril (PRINIVIL,ZESTRIL) 40 MG tablet Take 1 tablet (40 mg total) by mouth daily. 10/30/13 10/30/14  Salley Scarlet, MD  oxyCODONE (ROXICODONE) 5 MG immediate release tablet Take 1 tablet (5 mg total) by mouth every 4 (four) hours as needed for severe pain. 04/23/15   Melene Plan, DO  tiotropium (SPIRIVA HANDIHALER) 18 MCG inhalation capsule Place 1 capsule (18 mcg total) into inhaler and inhale daily. Patient not taking: Reported on 04/23/2015 03/09/15   Salley Scarlet, MD   BP 180/102 mmHg  Pulse 69  Temp(Src) 98.2 F (36.8 C) (Oral)  Resp 16  Ht 5\' 8"  (1.727 m)  Wt 232 lb (105.235 kg)  BMI 35.28 kg/m2  SpO2 100% Physical Exam  Constitutional: He is oriented to person, place, and time. He appears well-developed and well-nourished.  HENT:  Head: Normocephalic and atraumatic.  Eyes: EOM are normal. Pupils are equal, round, and reactive to light.  Neck: Normal range of motion. Neck supple. No JVD present.  Cardiovascular: Normal rate and regular rhythm.  Exam reveals no gallop and no friction rub.   No murmur heard. Pulmonary/Chest: No respiratory distress. He has no wheezes. He exhibits tenderness (left-sided chest wall tenderness palpation.).  Abdominal: He exhibits no distension. There is tenderness (Left side tenderness. Difficult to discern rib versus abdomen.). There is no rebound and no guarding.  Musculoskeletal: Normal range of motion.  Neurological: He is alert and oriented to person, place, and time.  Skin: No rash  noted. No pallor.  Psychiatric: He has a normal mood and affect. His behavior is normal.    ED Course  Procedures (including critical care time) Labs Review Labs Reviewed  URINALYSIS, ROUTINE W REFLEX MICROSCOPIC (NOT AT [facility])    Imaging Review Dg Ribs Unilateral W/chest Left  04/23/2015   CLINICAL DATA:  Fall on left side on Monday.  Left side rib pain.  EXAM: LEFT RIBS AND CHEST - 3+ VIEW  COMPARISON:  04/21/2015  FINDINGS: Left base atelectasis. There is a left lateral eighth rib fracture. No effusion or pneumothorax. Right lung is clear. Heart is normal size. Tortuosity of the thoracic aorta.  IMPRESSION: Left lateral eighth rib fracture.  Left base atelectasis.   Electronically Signed   By: Charlett Nose M.D.   On: 04/23/2015 20:01     EKG Interpretation None      MDM   Final diagnoses:  Left rib fracture, closed, initial encounter    59 yo M with a chief complaint of left-sided chest wall pain.  Likely rib fractures by history. No infectious symptoms unlikely pneumonia. Patient not hypoxic no tachycardia. Will evaluate for possible fractures versus pneumonia with rib films. Patient with possible abdominal tenderness though without tachycardia and injury occurring 5 days prior, will check urine to evaluate for possible hematuria. Patient declining needles at this time. We'll give oral pain medicine.  Left eighth rib fracture on x-ray. No signs of pneumonia. Sent home with incentive spirometer pain medicine PCP follow-up. 9:08 PM:  I have discussed the diagnosis/risks/treatment options with the patient and believe the pt to be eligible for discharge home to follow-up with PCP. We also discussed returning to the ED immediately if new or worsening sx occur. We discussed the sx which are most concerning (e.g., fever, cough, shortness of breath) that necessitate immediate return. Medications administered to the patient during their visit and any new prescriptions provided to the patient  are listed below.  Medications given during this visit Medications  acetaminophen (TYLENOL) tablet 1,000 mg (1,000 mg Oral Given 04/23/15 1952)  ibuprofen (ADVIL,MOTRIN) tablet 800 mg (800 mg Oral Given 04/23/15 1952)  oxyCODONE (Oxy IR/ROXICODONE) immediate release tablet 5 mg (5 mg Oral Given 04/23/15 1952)  ipratropium-albuterol (DUONEB) 0.5-2.5 (3) MG/3ML nebulizer solution 3 mL (3 mLs Nebulization Given 04/23/15 2035)    Discharge Medication List as of 04/23/2015  8:06 PM    START taking these medications   Details  oxyCODONE (ROXICODONE) 5 MG immediate release tablet Take 1 tablet (5 mg total) by mouth every 4 (four) hours as needed for severe pain., Starting 04/23/2015, Until Discontinued, Print         The patient appears reasonably screen and/or stabilized for discharge and I doubt any other medical condition or other [facility] requiring further screening, evaluation, or treatment in the ED at this time prior to discharge.    Melene Plan, DO 04/23/15 2108

## 2015-04-23 NOTE — Discharge Instructions (Signed)
°  Take 4 over the counter ibuprofen tablets 3 times a day or 2 over-the-counter naproxen tablets twice a day for pain.  Rib Fracture A rib fracture is a break or crack in one of the bones of the ribs. The ribs are like a cage that goes around your upper chest. A broken or cracked rib is often painful, but most do not cause other problems. Most rib fractures heal on their own in 1-3 months. HOME CARE  Avoid activities that cause pain to the injured area. Protect your injured area.  Slowly increase activity as told by your doctor.  Take medicine as told by your doctor.  Put ice on the injured area for the first 1-2 days after you have been treated or as told by your doctor.  Put ice in a plastic bag.  Place a towel between your skin and the bag.  Leave the ice on for 15-20 minutes at a time, every 2 hours while you are awake.  Do deep breathing as told by your doctor. You may be told to:  Take deep breaths many times a day.  Cough many times a day while hugging a pillow.  Use a device (incentive spirometer) to perform deep breathing many times a day.  Drink enough fluids to keep your pee (urine) clear or pale yellow.   Do not wear a rib belt or binder. These do not allow you to breathe deeply. GET HELP RIGHT AWAY IF:   You have a fever.  You have trouble breathing.   You cannot stop coughing.  You cough up thick or bloody spit (mucus).   You feel sick to your stomach (nauseous), throw up (vomit), or have belly (abdominal) pain.   Your pain gets worse and medicine does not help.  MAKE SURE YOU:   Understand these instructions.  Will watch your condition.  Will get help right away if you are not doing well or get worse. Document Released: 06/19/2008 Document Revised: 01/05/2013 Document Reviewed: 11/12/2012 [facility] Patient Information 2015 [place], [place]. This information is not intended to replace advice given to you by your health care provider. Make sure you  discuss any questions you have with your health care provider.

## 2015-04-23 NOTE — Progress Notes (Addendum)
Patient given 3 nebs in between with 3 incentive trial , neb -incentive- neb.

## 2015-04-23 NOTE — ED Notes (Addendum)
SOB all day today with hx of COPD and asthma. NAD at this time. Pt also fell on Monday and told EMS he was having pain to left rib area which is worse with deep breathing and moving. Pt had albuterol   Neb at home and duoneb en route along with Solumedrol  en route by EMS. Pt ambulated to restroom on arrival to room without difficulty. EMS unsure of RA O2 but states pt was 98% on 4L which was placed by first responders. Pt denies CP, states only "short of breath and rib pain" Pt asking for pain med on arrival. No grimacing or guarding noted.

## 2015-04-25 NOTE — Telephone Encounter (Signed)
He needs OV, [facility] just gave him medication
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
## 2015-04-25 NOTE — Telephone Encounter (Signed)
Call placed to patient and patient made aware.   Appointment scheduled for 04/26/2015 at 11:15am.

## 2015-04-25 NOTE — Telephone Encounter (Signed)
I called and spoke with Dawn in Health Information at [facility] she requested a fax cover sheet with patient's information faxed to [phone]. I have requested the records.

## 2015-04-26 ENCOUNTER — Encounter: Payer: Self-pay | Admitting: Family Medicine

## 2015-04-26 ENCOUNTER — Ambulatory Visit (INDEPENDENT_AMBULATORY_CARE_PROVIDER_SITE_OTHER): Payer: Medicaid Other | Admitting: Family Medicine

## 2015-04-26 VITALS — BP 138/78 | HR 82 | Temp 98.9°F | Resp 16 | Ht 67.0 in | Wt 227.0 lb

## 2015-04-26 DIAGNOSIS — S2232XD Fracture of one rib, left side, subsequent encounter for fracture with routine healing: Secondary | ICD-10-CM | POA: Diagnosis not present

## 2015-04-26 DIAGNOSIS — R51 Headache: Secondary | ICD-10-CM | POA: Diagnosis not present

## 2015-04-26 DIAGNOSIS — G8929 Other chronic pain: Secondary | ICD-10-CM | POA: Diagnosis not present

## 2015-04-26 DIAGNOSIS — M75102 Unspecified rotator cuff tear or rupture of left shoulder, not specified as traumatic: Secondary | ICD-10-CM | POA: Diagnosis not present

## 2015-04-26 DIAGNOSIS — R519 Headache, unspecified: Secondary | ICD-10-CM

## 2015-04-26 NOTE — Assessment & Plan Note (Signed)
I would not provide him with any further narcotic medications. He's been going between 2 different pharmacies in order to get early fills he agreed that he had been doing this. He also went to the ER for different times to try to get more pain medication this past week. He was actually given a prescription on 731 and therefore he she has significant amount of oxycodone 5 mg left over. I advised him to take this along with extra strength Tylenol. I will try to get him into a pain clinic but did make clear that because of his behavior this may be very difficult and if that is the case he will not have chronic pain management. I will continue to see him for his other non-pain related problems.
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
## 2015-04-26 NOTE — Patient Instructions (Addendum)
Use the spirometry 4-5 times a day, this helps your lungs and ribs  Referral to pain clinic Use your COPD medications NO FURTHER PAIN MEDICATION FROM MY OFFICE F/U as previous

## 2015-04-27 ENCOUNTER — Encounter (HOSPITAL_COMMUNITY): Payer: Self-pay | Admitting: Emergency Medicine

## 2015-04-27 ENCOUNTER — Emergency Department (HOSPITAL_COMMUNITY): Payer: Medicaid Other

## 2015-04-27 ENCOUNTER — Emergency Department (HOSPITAL_COMMUNITY)
Admission: EM | Admit: 2015-04-27 | Discharge: 2015-04-27 | Disposition: A | Discharge status: Home / Self Care | Payer: Medicaid Other | Attending: Emergency Medicine | Admitting: Emergency Medicine

## 2015-04-27 DIAGNOSIS — Z79899 Other long term (current) drug therapy: Secondary | ICD-10-CM | POA: Insufficient documentation

## 2015-04-27 DIAGNOSIS — E876 Hypokalemia: Secondary | ICD-10-CM

## 2015-04-27 DIAGNOSIS — E785 Hyperlipidemia, unspecified: Secondary | ICD-10-CM | POA: Diagnosis not present

## 2015-04-27 DIAGNOSIS — S2232XD Fracture of one rib, left side, subsequent encounter for fracture with routine healing: Secondary | ICD-10-CM

## 2015-04-27 DIAGNOSIS — E119 Type 2 diabetes mellitus without complications: Secondary | ICD-10-CM | POA: Insufficient documentation

## 2015-04-27 DIAGNOSIS — I10 Essential (primary) hypertension: Secondary | ICD-10-CM | POA: Diagnosis not present

## 2015-04-27 DIAGNOSIS — R0602 Shortness of breath: Secondary | ICD-10-CM

## 2015-04-27 DIAGNOSIS — I5030 Unspecified diastolic (congestive) heart failure: Secondary | ICD-10-CM | POA: Insufficient documentation

## 2015-04-27 DIAGNOSIS — W1839XD Other fall on same level, subsequent encounter: Secondary | ICD-10-CM | POA: Diagnosis not present

## 2015-04-27 DIAGNOSIS — Z87448 Personal history of other diseases of urinary system: Secondary | ICD-10-CM | POA: Diagnosis not present

## 2015-04-27 DIAGNOSIS — J449 Chronic obstructive pulmonary disease, unspecified: Secondary | ICD-10-CM

## 2015-04-27 DIAGNOSIS — Z7951 Long term (current) use of inhaled steroids: Secondary | ICD-10-CM | POA: Insufficient documentation

## 2015-04-27 DIAGNOSIS — J441 Chronic obstructive pulmonary disease with (acute) exacerbation: Secondary | ICD-10-CM | POA: Diagnosis not present

## 2015-04-27 LAB — BASIC METABOLIC PANEL
Anion gap: 12 (ref 5–15)
BUN: 17 mg/dL (ref 6–20)
CO2: 33 mmol/L — ABNORMAL HIGH (ref 22–32)
CREATININE: 1.09 mg/dL (ref 0.61–1.24)
Calcium: 8.8 mg/dL — ABNORMAL LOW (ref 8.9–10.3)
Chloride: 98 mmol/L — ABNORMAL LOW (ref 101–111)
GFR calc Af Amer: >60 mL/min (ref >60)
GLUCOSE: 137 mg/dL — AB (ref 65–99)
Potassium: 2.6 mmol/L — CL (ref 3.5–5.1)
Sodium: 143 mmol/L (ref 135–145)

## 2015-04-27 LAB — CBC WITH DIFFERENTIAL/PLATELET
BASOS PCT: 0 % (ref 0–1)
Basophils Absolute: 0 10*3/uL (ref 0.0–0.1)
EOS PCT: 0 % (ref 0–5)
Eosinophils Absolute: 0 10*3/uL (ref 0.0–0.7)
HCT: 41.7 % (ref 39.0–52.0)
HEMOGLOBIN: 13.6 g/dL (ref 13.0–17.0)
LYMPHS ABS: 2.4 10*3/uL (ref 0.7–4.0)
Lymphocytes Relative: 26 % (ref 12–46)
MCH: 32 pg (ref 26.0–34.0)
MCHC: 32.6 g/dL (ref 30.0–36.0)
MCV: 98.1 fL (ref 78.0–100.0)
MONO ABS: 0.5 10*3/uL (ref 0.1–1.0)
MONOS PCT: 6 % (ref 3–12)
NEUTROS ABS: 6.2 10*3/uL (ref 1.7–7.7)
NEUTROS PCT: 68 % (ref 43–77)
Platelets: 280 10*3/uL (ref 150–400)
RBC: 4.25 MIL/uL (ref 4.22–5.81)
RDW: 12.6 % (ref 11.5–15.5)
WBC: 9.2 10*3/uL (ref 4.0–10.5)

## 2015-04-27 MED ORDER — IBUPROFEN 800 MG PO TABS
800.0000 mg | ORAL_TABLET | Freq: Once | ORAL | Status: AC
  Administered 2015-04-27: 800 mg via ORAL
  Filled 2015-04-27: qty 1

## 2015-04-27 MED ORDER — METHYLPREDNISOLONE SODIUM SUCC 125 MG IJ SOLR
125.0000 mg | Freq: Once | INTRAMUSCULAR | Status: AC
  Administered 2015-04-27: 125 mg via INTRAVENOUS
  Filled 2015-04-27: qty 2

## 2015-04-27 MED ORDER — ETODOLAC 400 MG PO TABS: 400.0000 mg | ORAL_TABLET | Freq: Two times a day (BID) | ORAL | Status: DC | PRN

## 2015-04-27 MED ORDER — POTASSIUM CHLORIDE CRYS ER 20 MEQ PO TBCR
40.0000 meq | EXTENDED_RELEASE_TABLET | Freq: Once | ORAL | Status: AC
  Administered 2015-04-27: 40 meq via ORAL
  Filled 2015-04-27: qty 2

## 2015-04-27 MED ORDER — ALBUTEROL (5 MG/ML) CONTINUOUS INHALATION SOLN
10.0000 mg/h | INHALATION_SOLUTION | Freq: Once | RESPIRATORY_TRACT | Status: AC
  Administered 2015-04-27: 10 mg/h via RESPIRATORY_TRACT
  Filled 2015-04-27: qty 20

## 2015-04-27 MED ORDER — OXYCODONE-ACETAMINOPHEN 5-325 MG PO TABS: 2.0000 | ORAL_TABLET | ORAL | Status: DC | PRN

## 2015-04-27 NOTE — ED Notes (Signed)
Pt c/o sob and coughing up blood. Pt states he has broken ribs.

## 2015-04-27 NOTE — ED Provider Notes (Signed)
CSN: [id number]     Arrival date & time 04/27/15  0448 History   First MD Initiated Contact with Patient 04/27/15 0451     Chief Complaint  Patient presents with  . Shortness of Breath     (Consider location/radiation/quality/duration/timing/severity/associated sxs/prior Treatment) HPI Comments: Patient presents to the ER for evaluation of cough and shortness of breath. Patient reports a history of COPD and asthma. He had a fall recently, was diagnosed with a left eighth rib fracture on July 30. Patient reports that he has been experiencing continued pain in the area since the fall and injury. Yesterday afternoon he started having increasing shortness of breath not relieved by his inhalers and nebulizer treatments. Symptoms worsened overnight. Patient reports that he woke up coughing, has had sputum production and has been a small amount of blood in it tonight.  Patient is a 59 y.o. male presenting with shortness of breath.  Shortness of Breath Associated symptoms: cough     Past Medical History  Diagnosis Date  . Diabetes mellitus   . Hypertension   . Asthma   . COPD (chronic obstructive pulmonary disease)   . Hyperlipidemia   . Headache(784.0)   . BPH (benign prostatic hyperplasia)   . Diastolic CHF   . Substance abuse     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 Cocaine   Past Surgical History  Procedure Laterality Date  . Brain surgery    . Ventriculoperitoneal shunt    . Colon surgery  06/2011    Diverticulitis Complicated  . Mandible surgery     Family History  Problem Relation Age of Onset  . Hypertension Mother   . Hyperlipidemia Mother   . Depression Mother   . Hypertension Father   . Hyperlipidemia Father   . Diabetes Father   . Arthritis    . Asthma     History  Substance Use Topics  . Smoking status: Never Smoker   . Smokeless tobacco: Never Used  . Alcohol Use: No    Review of Systems  Respiratory: Positive for cough and shortness of breath.   All other systems  reviewed and are negative.     Allergies  Morphine  Home Medications   Prior to Admission medications   Medication Sig Start Date End Date Taking? Authorizing Provider  albuterol (PROVENTIL HFA;VENTOLIN HFA) 108 (90 BASE) MCG/ACT inhaler Inhale 2 puffs into the lungs every 6 (six) hours as needed for wheezing. 03/09/15   Salley Scarlet, MD  albuterol (PROVENTIL) (2.5 MG/3ML) 0.083% nebulizer solution Take 2.5 mg by nebulization every 6 (six) hours as needed for wheezing.    Historical Provider, MD  amLODipine (NORVASC) 10 MG tablet Take 1 tablet (10 mg total) by mouth daily. 10/30/13   Salley Scarlet, MD  budesonide-formoterol [facility]) 160-4.5 MCG/ACT inhaler Inhale 2 puffs into the lungs 2 (two) times daily. 01/18/15   Salley Scarlet, MD  cloNIDine (CATAPRES) 0.2 MG tablet Take 1 tablet (0.2 mg total) by mouth 2 (two) times daily. 03/09/15   Salley Scarlet, MD  glucose blood (ACCU-CHEK AVIVA PLUS) test strip USE TO TEST BLOOD SUGAR ONCE DAILY. DX: 250.00 01/25/14   Salley Scarlet, MD  hydrALAZINE (APRESOLINE) 50 MG tablet Take 1 tablet (50 mg total) by mouth 3 (three) times daily. 10/27/14   Salley Scarlet, MD  hydrochlorothiazide (HYDRODIURIL) 25 MG tablet Take 1 tablet (25 mg total) by mouth daily. 11/17/12   Salley Scarlet, MD  HYDROcodone-acetaminophen (NORCO) 7.5-325 MG per tablet Take  1 tablet by mouth every 6 (six) hours as needed. Pain 03/21/15   Salley Scarlet, MD  lisinopril (PRINIVIL,ZESTRIL) 40 MG tablet Take 1 tablet (40 mg total) by mouth daily. 10/30/13 10/30/14  Salley Scarlet, MD  Multiple Vitamin (MULTIVITAMIN WITH MINERALS) TABS Take 1 tablet by mouth daily.    Historical Provider, MD  oxyCODONE (ROXICODONE) 5 MG immediate release tablet Take 1 tablet (5 mg total) by mouth every 4 (four) hours as needed for severe pain. 04/23/15   Melene Plan, DO  simvastatin (ZOCOR) 20 MG tablet Take 1 tablet (20 mg total) by mouth every evening. 03/09/15 03/08/16  Salley Scarlet, MD    tiotropium (SPIRIVA HANDIHALER) 18 MCG inhalation capsule Place 1 capsule (18 mcg total) into inhaler and inhale daily. 03/09/15   Salley Scarlet, MD   BP 164/97 mmHg  Pulse 78  Temp(Src) 98 F (36.7 C) (Oral)  Resp 18  Ht 5\' 8"  (1.727 m)  Wt 230 lb (104.327 kg)  BMI 34.98 kg/m2  SpO2 96% Physical Exam  Constitutional: He is oriented to person, place, and time. He appears well-developed and well-nourished. No distress.  HENT:  Head: Normocephalic and atraumatic.  Right Ear: Hearing normal.  Left Ear: Hearing normal.  Nose: Nose normal.  Mouth/Throat: Oropharynx is clear and moist and mucous membranes are normal.  Eyes: Conjunctivae and EOM are normal. Pupils are equal, round, and reactive to light.  Neck: Normal range of motion. Neck supple.  Cardiovascular: Regular rhythm, S1 normal and S2 normal.  Exam reveals no gallop and no friction rub.   No murmur heard. Pulmonary/Chest: Accessory muscle usage present. No respiratory distress. He has decreased breath sounds. He has wheezes. He exhibits tenderness.    Abdominal: Soft. Normal appearance and bowel sounds are normal. There is no hepatosplenomegaly. There is no tenderness. There is no rebound, no guarding, no tenderness at McBurney's point and negative Murphy's sign. No hernia.  Musculoskeletal: Normal range of motion.  Neurological: He is alert and oriented to person, place, and time. He has normal strength. No cranial nerve deficit or sensory deficit. Coordination normal. GCS eye subscore is 4. GCS verbal subscore is 5. GCS motor subscore is 6.  Skin: Skin is warm, dry and intact. No rash noted. No cyanosis.  Psychiatric: He has a normal mood and affect. His speech is normal and behavior is normal. Thought content normal.  Nursing note and vitals reviewed.   ED Course  Procedures (including critical care time) Labs Review Labs Reviewed  BASIC METABOLIC PANEL - Abnormal; Notable for the following:    Potassium 2.6 (*)     Chloride 98 (*)    CO2 33 (*)    Glucose, Bld 137 (*)    Calcium 8.8 (*)    All other components within normal limits  CBC WITH DIFFERENTIAL/PLATELET    Imaging Review Dg Chest 2 View  04/27/2015   CLINICAL DATA:  Chest pain and dyspnea.  EXAM: CHEST  2 VIEW  COMPARISON:  04/23/2015  FINDINGS: There is unchanged moderate right hemidiaphragm elevation. The lungs are clear. There is a small left effusion. There is no pneumothorax. Pulmonary vasculature is normal. The known left eighth rib fracture is not visible on these radiographs.  IMPRESSION: Stable right hemidiaphragm elevation.  Small left pleural effusion.   Electronically Signed   By: Ellery Plunk M.D.   On: 04/27/2015 06:11     EKG Interpretation None      MDM   Final diagnoses:  Shortness of breath   rib fracture  COPD Hypokalemia  Patient presents to emergency for evaluation of shortness of breath. Patient reports that he does have a history of COPD. Additionally, patient had a fall last week and was diagnosed with a rib fracture. He is continuing to experience pain in the left side of his ribs where the fracture is. He did have decreased breath sounds, mild distress and wheezing upon arrival. This improved with Solu-Medrol and nebulizer treatment. Chest x-ray does not show any development of pneumonia. Patient will require continued COPD treatment and analgesia. Patient's lab work did reveal hypokalemia. Patient administered potassium here in the ER and will be given additional potassium as an outpatient.    Gilda Crease, MD 04/27/15 [phone]

## 2015-04-27 NOTE — Discharge Instructions (Signed)
Chronic Obstructive Pulmonary Disease °Chronic obstructive pulmonary disease (COPD) is a common lung problem. In COPD, the flow of air from the lungs is limited. The way your lungs work will probably never return to normal, but there are things you can do to improve your lungs and make yourself feel better. °HOME CARE °· Take all medicines as told by your doctor. °· Avoid medicines or cough syrups that dry up your airway (such as antihistamines) and do not allow you to get rid of thick spit. You do not need to avoid them if told differently by your doctor. °· If you smoke, stop. Smoking makes the problem worse. °· Avoid being around things that make your breathing worse (like smoke, chemicals, and fumes). °· Use oxygen therapy and therapy to help improve your lungs (pulmonary rehabilitation) if told by your doctor. If you need home oxygen therapy, ask your doctor if you should buy a tool to measure your oxygen level (oximeter). °· Avoid people who have a sickness you can catch (contagious). °· Avoid going outside when it is very hot, cold, or humid. °· Eat healthy foods. Eat smaller meals more often. Rest before meals. °· Stay active, but remember to also rest. °· Make sure to get all the shots (vaccines) your doctor recommends. Ask your doctor if you need a pneumonia shot. °· Learn and use tips on how to relax. °· Learn and use tips on how to control your breathing as told by your doctor. Try: °· Breathing in (inhaling) through your nose for 1 second. Then, pucker your lips and breath out (exhale) through your lips for 2 seconds. °· Putting one hand on your belly (abdomen). Breathe in slowly through your nose for 1 second. Your hand on your belly should move out. Pucker your lips and breathe out slowly through your lips. Your hand on your belly should move in as you breathe out. °· Learn and use controlled coughing to clear thick spit from your lungs. The steps are: °· Lean your head a little forward. °· Breathe in  deeply. °· Try to hold your breath for 3 seconds. °· Keep your mouth slightly open while coughing 2 times. °· Spit any thick spit out into a tissue. °· Rest and do the steps again 1 or 2 times as needed. °GET HELP IF: °· You cough up more thick spit than usual. °· There is a change in the color or thickness of the spit. °· It is harder to breathe than usual. °· Your breathing is faster than usual. °GET HELP RIGHT AWAY IF:  °· You have shortness of breath while resting. °· You have shortness of breath that stops you from: °· Being able to talk. °· Doing normal activities. °· You chest hurts for longer than 5 minutes. °· Your skin color is more blue than usual. °· Your pulse oximeter shows that you have low oxygen for longer than 5 minutes. °MAKE SURE YOU:  °· Understand these instructions. °· Will watch your condition. °· Will get help right away if you are not doing well or get worse. °Document Released: 02/27/2008 Document Revised: 01/25/2014 Document Reviewed: 05/07/2013 °ExitCare® Patient Information ©2015 [employer]. This information is not intended to replace advice given to you by your health care provider. Make sure you discuss any questions you have with your health care provider. ° °Hypokalemia °Hypokalemia means that the amount of potassium in the blood is lower than normal. Potassium is a chemical, called an electrolyte, that helps regulate the amount   of fluid in the body. It also stimulates muscle contraction and helps nerves function properly. Most of the body's potassium is inside of cells, and only a very small amount is in the blood. Because the amount in the blood is so small, minor changes can be life-threatening. °CAUSES °· Antibiotics. °· Diarrhea or vomiting. °· Using laxatives too much, which can cause diarrhea. °· Chronic kidney disease. °· Water pills (diuretics). °· Eating disorders (bulimia). °· Low magnesium level. °· Sweating a lot. °SIGNS AND  SYMPTOMS °· Weakness. °· Constipation. °· Fatigue. °· Muscle cramps. °· Mental confusion. °· Skipped heartbeats or irregular heartbeat (palpitations). °· Tingling or numbness. °DIAGNOSIS  °Your health care provider can diagnose hypokalemia with blood tests. In addition to checking your potassium level, your health care provider may also check other lab tests. °TREATMENT °Hypokalemia can be treated with potassium supplements taken by mouth or adjustments in your current medicines. If your potassium level is very low, you may need to get potassium through a vein (IV) and be monitored in the hospital. A diet high in potassium is also helpful. Foods high in potassium are: °· Nuts, such as peanuts and pistachios. °· Seeds, such as sunflower seeds and pumpkin seeds. °· Peas, lentils, and lima beans. °· Whole grain and bran cereals and breads. °· Fresh fruit and vegetables, such as apricots, avocado, bananas, cantaloupe, kiwi, oranges, tomatoes, asparagus, and potatoes. °· Orange and tomato juices. °· Red meats. °· Fruit yogurt. °HOME CARE INSTRUCTIONS °· Take all medicines as prescribed by your health care provider. °· Maintain a healthy diet by including nutritious food, such as fruits, vegetables, nuts, whole grains, and lean meats. °· If you are taking a laxative, be sure to follow the directions on the label. °SEEK MEDICAL CARE IF: °· Your weakness gets worse. °· You feel your heart pounding or racing. °· You are vomiting or having diarrhea. °· You are diabetic and having trouble keeping your blood glucose in the normal range. °SEEK IMMEDIATE MEDICAL CARE IF: °· You have chest pain, shortness of breath, or dizziness. °· You are vomiting or having diarrhea for more than 2 days. °· You faint. °MAKE SURE YOU:  °· Understand these instructions. °· Will watch your condition. °· Will get help right away if you are not doing well or get worse. °Document Released: 09/10/2005 Document Revised: 07/01/2013 Document Reviewed:  03/13/2013 °ExitCare® Patient Information ©2015 [employer]. This information is not intended to replace advice given to you by your health care provider. Make sure you discuss any questions you have with your health care provider. ° °

## 2015-04-27 NOTE — ED Notes (Signed)
CRITICAL VALUE ALERT  Critical value received: potassium 2.6  Date of notification:  04/27/2015  Time of notification:  0548  Critical value read back:Yes.    Nurse who received alert:  Neldon Mc RN  MD notified (1st page):  Dr. Blinda Leatherwood  Time of first page:  [phone]

## 2015-04-30 LAB — OPIATES/OPIOIDS (LC/MS-MS)
Codeine Urine: NEGATIVE ng/mL (ref <50)
Hydrocodone: 133 ng/mL — AB (ref <50)
Hydromorphone: NEGATIVE ng/mL (ref <50)
MORPHINE: NEGATIVE ng/mL (ref <50)
NORHYDROCODONE, UR: 59 ng/mL — AB (ref <50)
Noroxycodone, Ur: 2787 ng/mL — AB (ref <50)
OXYCODONE, UR: 1903 ng/mL — AB (ref <50)
OXYMORPHONE, URINE: 1197 ng/mL — AB (ref <50)

## 2015-04-30 LAB — OXYCODONE, URINE (LC/MS-MS)
Noroxycodone, Ur: 2787 ng/mL — AB (ref <50)
OXYCODONE, UR: 1903 ng/mL — AB (ref <50)
OXYMORPHONE, URINE: 1197 ng/mL — AB (ref <50)

## 2015-04-30 LAB — MEPERIDINE (GC/LC/MS), URINE
Meperidine (GC/LC/MS), ur confirm: 138 ng/mL — AB (ref <100)
NORMEPERIDINE UR CONFIRM: 931 ng/mL — AB (ref <100)

## 2015-05-03 LAB — PRESCRIPTION MONITORING PROFILE (13 PANEL)
Amphetamine/Meth: NEGATIVE ng/mL
BARBITURATE SCREEN, URINE: NEGATIVE ng/mL
Benzodiazepine Screen, Urine: NEGATIVE ng/mL
Buprenorphine, Urine: NEGATIVE ng/mL
COCAINE METABOLITES: NEGATIVE ng/mL
Cannabinoid Scrn, Ur: NEGATIVE ng/mL
Creatinine, Urine: 77.86 mg/dL (ref >20.0)
Fentanyl, Ur: NEGATIVE ng/mL
METHADONE SCREEN, URINE: NEGATIVE ng/mL
Nitrites, Initial: NEGATIVE ug/mL
Propoxyphene: NEGATIVE ng/mL
Tramadol Scrn, Ur: NEGATIVE ng/mL
pH, Initial: 6.9 pH (ref 4.5–8.9)

## 2015-06-02 ENCOUNTER — Telehealth: Payer: Self-pay | Admitting: *Deleted

## 2015-06-02 NOTE — Telephone Encounter (Signed)
Received call from Dr. Laurian Brim.   Reports that patient seen in office today for new patient visit. States that patient completed new patient questionnaire and admitted to cocaine usage the night before. MD reports that he advised against urine drug screen stating that illegal substance will be noted and no medication will be able to be prescribed from that office in the future. Patient requested pain medication from this visit and MD advised that no prescriptions can be given without completing new patent intake, including drug screen. Per report, patient became upset that no prescriptions would be given and left office.   Also reports that patient voiced c/o foot issues and blackness in between his toes. Advised to follow up with PCP d/t DM. Call placed to patient to attempt to schedule appointment. Patient did answer phone, but then quickly hung up on writer and would not answer again.  MD to be made aware.

## 2015-06-03 NOTE — Telephone Encounter (Signed)
Please send dismissal letter to pt, Rude to staff, non compliance,

## 2015-06-13 ENCOUNTER — Telehealth: Payer: Self-pay | Admitting: Family Medicine

## 2015-06-13 NOTE — Telephone Encounter (Signed)
Pt is calling to request a referral to the Pain clinic in [place]. Please call [phone]

## 2015-06-14 NOTE — Telephone Encounter (Signed)
Contacted pt and asked pt about the Pain Clinic in [place] and she stated was not happy with them and would like some one in [place], I advised the pt that I will be glad to send him to someone in [place] but it usually takes a few weeks to get in to pain clinic d/t having review.Pt stated he understood and will wait on call.

## 2015-06-15 NOTE — Telephone Encounter (Signed)
[facility]

## 2015-07-04 ENCOUNTER — Encounter: Payer: Self-pay | Admitting: Family Medicine

## 2015-07-11 ENCOUNTER — Ambulatory Visit: Payer: Medicaid Other | Admitting: Family Medicine

## 2015-11-12 ENCOUNTER — Encounter (HOSPITAL_COMMUNITY): Payer: Self-pay

## 2015-11-12 ENCOUNTER — Emergency Department (HOSPITAL_COMMUNITY): Payer: Medicaid Other

## 2015-11-12 ENCOUNTER — Emergency Department (HOSPITAL_COMMUNITY)
Admission: EM | Admit: 2015-11-12 | Discharge: 2015-11-12 | Disposition: A | Discharge status: Home / Self Care | Payer: Medicaid Other | Attending: Emergency Medicine | Admitting: Emergency Medicine

## 2015-11-12 DIAGNOSIS — J441 Chronic obstructive pulmonary disease with (acute) exacerbation: Secondary | ICD-10-CM | POA: Insufficient documentation

## 2015-11-12 DIAGNOSIS — E119 Type 2 diabetes mellitus without complications: Secondary | ICD-10-CM | POA: Insufficient documentation

## 2015-11-12 DIAGNOSIS — R0602 Shortness of breath: Secondary | ICD-10-CM | POA: Diagnosis present

## 2015-11-12 DIAGNOSIS — Z791 Long term (current) use of non-steroidal anti-inflammatories (NSAID): Secondary | ICD-10-CM | POA: Insufficient documentation

## 2015-11-12 DIAGNOSIS — E876 Hypokalemia: Secondary | ICD-10-CM | POA: Insufficient documentation

## 2015-11-12 DIAGNOSIS — Z79899 Other long term (current) drug therapy: Secondary | ICD-10-CM | POA: Insufficient documentation

## 2015-11-12 DIAGNOSIS — E785 Hyperlipidemia, unspecified: Secondary | ICD-10-CM | POA: Insufficient documentation

## 2015-11-12 DIAGNOSIS — I5030 Unspecified diastolic (congestive) heart failure: Secondary | ICD-10-CM | POA: Diagnosis not present

## 2015-11-12 DIAGNOSIS — I10 Essential (primary) hypertension: Secondary | ICD-10-CM | POA: Diagnosis not present

## 2015-11-12 DIAGNOSIS — Z885 Allergy status to narcotic agent status: Secondary | ICD-10-CM | POA: Diagnosis not present

## 2015-11-12 LAB — CBC WITH DIFFERENTIAL/PLATELET
BASOS ABS: 0.1 10*3/uL (ref 0.0–0.1)
BASOS PCT: 1 %
Eosinophils Absolute: 0.1 10*3/uL (ref 0.0–0.7)
Eosinophils Relative: 1 %
HEMATOCRIT: 43.9 % (ref 39.0–52.0)
Hemoglobin: 14.6 g/dL (ref 13.0–17.0)
LYMPHS PCT: 33 %
Lymphs Abs: 3 10*3/uL (ref 0.7–4.0)
MCH: 31.6 pg (ref 26.0–34.0)
MCHC: 33.3 g/dL (ref 30.0–36.0)
MCV: 95 fL (ref 78.0–100.0)
Monocytes Absolute: 0.6 10*3/uL (ref 0.1–1.0)
Monocytes Relative: 6 %
NEUTROS ABS: 5.4 10*3/uL (ref 1.7–7.7)
Neutrophils Relative %: 59 %
Platelets: 268 10*3/uL (ref 150–400)
RBC: 4.62 MIL/uL (ref 4.22–5.81)
RDW: 13 % (ref 11.5–15.5)
WBC: 9.1 10*3/uL (ref 4.0–10.5)

## 2015-11-12 LAB — COMPREHENSIVE METABOLIC PANEL
ALK PHOS: 62 U/L (ref 38–126)
ALT: 13 U/L — ABNORMAL LOW (ref 17–63)
AST: 27 U/L (ref 15–41)
Albumin: 4.2 g/dL (ref 3.5–5.0)
Anion gap: 10 (ref 5–15)
BILIRUBIN TOTAL: 1 mg/dL (ref 0.3–1.2)
BUN: 12 mg/dL (ref 6–20)
CO2: 29 mmol/L (ref 22–32)
Calcium: 9.3 mg/dL (ref 8.9–10.3)
Chloride: 104 mmol/L (ref 101–111)
Creatinine, Ser: 1.16 mg/dL (ref 0.61–1.24)
GFR calc non Af Amer: >60 mL/min (ref >60)
Glucose, Bld: 111 mg/dL — ABNORMAL HIGH (ref 65–99)
Potassium: 2.8 mmol/L — ABNORMAL LOW (ref 3.5–5.1)
SODIUM: 143 mmol/L (ref 135–145)
TOTAL PROTEIN: 8.4 g/dL — AB (ref 6.5–8.1)

## 2015-11-12 LAB — TROPONIN I: Troponin I: <0.03 ng/mL (ref <0.031)

## 2015-11-12 LAB — MAGNESIUM: MAGNESIUM: 2.2 mg/dL (ref 1.7–2.4)

## 2015-11-12 LAB — CBG MONITORING, ED: GLUCOSE-CAPILLARY: 116 mg/dL — AB (ref 65–99)

## 2015-11-12 MED ORDER — DEXAMETHASONE 4 MG PO TABS: 4.0000 mg | ORAL_TABLET | Freq: Two times a day (BID) | ORAL | Status: DC

## 2015-11-12 MED ORDER — IPRATROPIUM BROMIDE 0.02 % IN SOLN
0.5000 mg | Freq: Once | RESPIRATORY_TRACT | Status: AC
  Administered 2015-11-12: 0.5 mg via RESPIRATORY_TRACT
  Filled 2015-11-12: qty 2.5

## 2015-11-12 MED ORDER — POTASSIUM CHLORIDE ER 20 MEQ PO TBCR: 20.0000 meq | EXTENDED_RELEASE_TABLET | Freq: Every day | ORAL | Status: DC

## 2015-11-12 MED ORDER — ALBUTEROL SULFATE (2.5 MG/3ML) 0.083% IN NEBU
5.0000 mg | INHALATION_SOLUTION | Freq: Once | RESPIRATORY_TRACT | Status: AC
  Administered 2015-11-12: 5 mg via RESPIRATORY_TRACT
  Filled 2015-11-12: qty 6

## 2015-11-12 MED ORDER — METHYLPREDNISOLONE SODIUM SUCC 125 MG IJ SOLR
125.0000 mg | Freq: Once | INTRAMUSCULAR | Status: AC
  Administered 2015-11-12: 125 mg via INTRAVENOUS
  Filled 2015-11-12: qty 2

## 2015-11-12 MED ORDER — ALBUTEROL SULFATE HFA 108 (90 BASE) MCG/ACT IN AERS: 2.0000 | INHALATION_SPRAY | RESPIRATORY_TRACT | Status: DC | PRN

## 2015-11-12 MED ORDER — POTASSIUM CHLORIDE CRYS ER 20 MEQ PO TBCR
40.0000 meq | EXTENDED_RELEASE_TABLET | Freq: Once | ORAL | Status: AC
  Administered 2015-11-12: 40 meq via ORAL
  Filled 2015-11-12: qty 2

## 2015-11-12 MED ORDER — ONDANSETRON HCL 4 MG PO TABS
4.0000 mg | ORAL_TABLET | Freq: Once | ORAL | Status: AC
  Administered 2015-11-12: 4 mg via ORAL
  Filled 2015-11-12: qty 1

## 2015-11-12 MED ORDER — KETOROLAC TROMETHAMINE 30 MG/ML IJ SOLN
30.0000 mg | Freq: Once | INTRAMUSCULAR | Status: AC
  Administered 2015-11-12: 30 mg via INTRAVENOUS
  Filled 2015-11-12: qty 1

## 2015-11-12 NOTE — Discharge Instructions (Signed)
Your chest x-ray is negative for acute problem. Your electrocardiogram and your heart enzymes were negative for acute cardiac event. Your potassium is low. Please use foods high in potassium, and usual potassium tablet daily. Please continue your albuterol inhaler and nebulizers. Use Decadron 2 times daily with food. Please see Dr. Jeanice Lim for additional evaluation and management of these issues. Return to the ED if any changes or problems. Chronic Obstructive Pulmonary Disease Exacerbation Chronic obstructive pulmonary disease (COPD) is a common lung problem. In COPD, the flow of air from the lungs is limited. COPD exacerbations are times that breathing gets worse and you need extra treatment. Without treatment they can be life threatening. If they happen often, your lungs can become more damaged. If your COPD gets worse, your doctor may treat you with:  Medicines.  Oxygen.  Different ways to clear your airway, such as using a mask. HOME CARE  Do not smoke.  Avoid tobacco smoke and other things that bother your lungs.  If given, take your antibiotic medicine as told. Finish the medicine even if you start to feel better.  Only take medicines as told by your doctor.  Drink enough fluids to keep your pee (urine) clear or pale yellow (unless your doctor has told you not to).  Use a cool mist machine (vaporizer).  If you use oxygen or a machine that turns liquid medicine into a mist (nebulizer), continue to use them as told.  Keep up with shots (vaccinations) as told by your doctor.  Exercise regularly.  Eat healthy foods.  Keep all doctor visits as told. GET HELP RIGHT AWAY IF:  You are very short of breath and it gets worse.  You have trouble talking.  You have bad chest pain.  You have blood in your spit (sputum).  You have a fever.  You keep throwing up (vomiting).  You feel weak, or you pass out (faint).  You feel confused.  You keep getting worse. MAKE SURE  YOU:  Understand these instructions.  Will watch your condition.  Will get help right away if you are not doing well or get worse.   This information is not intended to replace advice given to you by your health care provider. Make sure you discuss any questions you have with your health care provider.   Document Released: 08/30/2011 Document Revised: 10/01/2014 Document Reviewed: 05/15/2013 Elsevier Interactive Patient Education [employer]

## 2015-11-12 NOTE — ED Provider Notes (Signed)
CSN: [id number]     Arrival date & time 11/12/15  0003 History   First MD Initiated Contact with Patient 11/12/15 0009     Chief Complaint  Patient presents with  . 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  Chief complaint shortness of breath and "my chest hurts".  The patient states that several hours ago he began having occulted with breathing. Proximally 2 hours ago he used his albuterol inhaler, but states this is not helping. The patient states he has cough occasionally, but is not coughing up any blood. He complains of being hot for a while and then cold, but he has not had the opportunity to have his temperature measured. Patient complains of some upper respiratory infection symptoms recently. It is of note that the patient had a fracture of the left rib on 04/23/2015, but no injury to the chest since that time. He feels that his symptoms are getting worse. There's been no palpitations. No new extremity pains. The patient has not had any prolonged trips, or times of prolonged inactivity.    Patient is a 60 y.o. male presenting with shortness of breath. The history is provided by the patient.  Shortness of Breath   Past Medical History  Diagnosis Date  . Diabetes mellitus   . Hypertension   . Asthma   . COPD (chronic obstructive pulmonary disease) (HCC)   . Hyperlipidemia   . Headache(784.0)   . BPH (benign prostatic hyperplasia)   . Diastolic CHF (HCC)   . Substance abuse     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 Cocaine   Past Surgical History  Procedure Laterality Date  . Brain surgery    . Ventriculoperitoneal shunt    . Colon surgery  06/2011    Diverticulitis Complicated  . Mandible surgery     Family History  Problem Relation Age of Onset  . Hypertension Mother   . Hyperlipidemia  Mother   . Depression Mother   . Hypertension Father   . Hyperlipidemia Father   . Diabetes Father   . Arthritis    . Asthma     Social History  Substance Use Topics  . Smoking status: Never Smoker   . Smokeless tobacco: Never Used  . Alcohol Use: No    Review of Systems  Respiratory: Positive for shortness of breath.        Chest wall pain.  All other systems reviewed and are negative.     Allergies  Morphine  Home Medications   Prior to Admission medications   Medication Sig Start Date End Date Taking? Authorizing Provider  albuterol (PROVENTIL HFA;VENTOLIN HFA) 108 (90 BASE) MCG/ACT inhaler Inhale 2 puffs into the lungs every 6 (six) hours as needed for wheezing. 03/09/15   Salley Scarlet, MD  albuterol (PROVENTIL) (2.5 MG/3ML) 0.083% nebulizer solution Take 2.5 mg by nebulization every 6 (six) hours as needed for wheezing.    Historical Provider, MD  amLODipine (NORVASC) 10 MG tablet Take 1 tablet (10 mg total) by mouth daily. 10/30/13   Salley Scarlet, MD  budesonide-formoterol [facility]) 160-4.5 MCG/ACT inhaler Inhale 2 puffs into the lungs 2 (two) times daily. 01/18/15   Salley Scarlet, MD  cloNIDine (CATAPRES) 0.2 MG tablet Take 1 tablet (0.2 mg total) by mouth 2 (two) times daily. 03/09/15   Salley Scarlet, MD  etodolac (LODINE) 400 MG tablet Take  1 tablet (400 mg total) by mouth 2 (two) times daily as needed for moderate pain. 04/27/15   Gilda Crease, MD  glucose blood (ACCU-CHEK AVIVA PLUS) test strip USE TO TEST BLOOD SUGAR ONCE DAILY. DX: 250.00 01/25/14   Salley Scarlet, MD  hydrALAZINE (APRESOLINE) 50 MG tablet Take 1 tablet (50 mg total) by mouth 3 (three) times daily. 10/27/14   Salley Scarlet, MD  hydrochlorothiazide (HYDRODIURIL) 25 MG tablet Take 1 tablet (25 mg total) by mouth daily. 11/17/12   Salley Scarlet, MD  HYDROcodone-acetaminophen (NORCO) 7.5-325 MG per tablet Take 1 tablet by mouth every 6 (six) hours as needed. Pain 03/21/15   Salley Scarlet, MD  lisinopril (PRINIVIL,ZESTRIL) 40 MG tablet Take 1 tablet (40 mg total) by mouth daily. 10/30/13 10/30/14  Salley Scarlet, MD  Multiple Vitamin (MULTIVITAMIN WITH MINERALS) TABS Take 1 tablet by mouth daily.    Historical Provider, MD  oxyCODONE (ROXICODONE) 5 MG immediate release tablet Take 1 tablet (5 mg total) by mouth every 4 (four) hours as needed for severe pain. 04/23/15   Melene Plan, DO  oxyCODONE-acetaminophen (PERCOCET) 5-325 MG per tablet Take 2 tablets by mouth every 4 (four) hours as needed. 04/27/15   Gilda Crease, MD  simvastatin (ZOCOR) 20 MG tablet Take 1 tablet (20 mg total) by mouth every evening. 03/09/15 03/08/16  Salley Scarlet, MD  tiotropium (SPIRIVA HANDIHALER) 18 MCG inhalation capsule Place 1 capsule (18 mcg total) into inhaler and inhale daily. 03/09/15   Salley Scarlet, MD   There were no vitals taken for this visit. Physical Exam  Constitutional: He is oriented to person, place, and time. He appears well-developed and well-nourished.  Non-toxic appearance.  HENT:  Head: Normocephalic.  Right Ear: Tympanic membrane and external ear normal.  Left Ear: Tympanic membrane and external ear normal.  Eyes: EOM and lids are normal. Pupils are equal, round, and reactive to light.  Neck: Normal range of motion. Neck supple. Carotid bruit is not present.  Cardiovascular: Normal rate, regular rhythm, normal heart sounds, intact distal pulses and normal pulses.  Exam reveals no gallop and no friction rub.   Pulmonary/Chest: No respiratory distress. He has wheezes. He exhibits tenderness.  Patient speaks in a soft whisper. There is symmetrical rise and fall of the chest. The patient has some laboring of respirations. There are bilateral wheezes present, with some scattered rhonchi present. He has anterior chest wall tenderness present.  Abdominal: Soft. Bowel sounds are normal. There is no tenderness. There is no guarding.  Musculoskeletal: Normal range of  motion.  Negative Homans sign. No pitting edema of the upper or lower extremities.  Lymphadenopathy:       Head (right side): No submandibular adenopathy present.       Head (left side): No submandibular adenopathy present.    He has no cervical adenopathy.  Neurological: He is alert and oriented to person, place, and time. He has normal strength. No cranial nerve deficit or sensory deficit.  Skin: Skin is warm and dry.  Psychiatric: He has a normal mood and affect. His speech is normal.  Nursing note and vitals reviewed.   ED Course  Procedures (including critical care time) Labs Review Labs Reviewed  CBC WITH DIFFERENTIAL/PLATELET  COMPREHENSIVE METABOLIC PANEL  TROPONIN I  CBG MONITORING, ED    Imaging Review No results found. I have personally reviewed and evaluated these images and lab results as part of my medical decision-making.  EKG Interpretation   Date/Time:  Saturday November 12 2015 00:11:54 EST Ventricular Rate:  72 PR Interval:    QRS Duration: 94 QT Interval:  419 QTC Calculation: 458 R Axis:   -46 Text Interpretation:  Normal sinus rhythm Artifact Left anterior  fascicular block LVH with secondary repolarization abnormality No  significant change since last tracing Confirmed by WARD,  DO, KRISTEN  ([id number]) on 11/12/2015 12:16:28 AM      MDM  Vital signs are well within normal limits. Pulse oximetry is 96-98% on room air.  The electrocardiogram shows a normal sinus rhythm at 72 bpm. There is noted a left anterior fascicular block with left ventricular hypertrophy present. No acute event noted.  Complete blood count is well within normal limits.  Capillary blood glucose is slightly elevated at 116. Contents of metabolic panel shows the potassium to be slow at 2.8, otherwise within normal limits. The troponin is less than 0.03, negative for acute cardiac event.  Reevaluation after the patient returned from the emergency department reveals some  improvement in the wheezing. Patient is moving air better. Patient states he is having some pain in the left frontal portion of his head as well as the center of his chest. IV toradol given. Patient continues to have some wheezing present time. A second nebulizer treatment has been given.  The plan at this time is for the patient to be treated with oral potassium daily, 6 day course of Decadron, and albuterol inhaler,. I've instructed the patient in the sheriff officer to return if not improving.    Final diagnoses:  None    **I have reviewed nursing notes, vital signs, and all appropriate lab and imaging results for this patient.*  ishe   Ivery Quale, PA-C 11/12/15 0146  Layla Maw Ward, DO 11/12/15 [phone]

## 2015-11-12 NOTE — ED Notes (Signed)
Pt states he has asthma and has been sob with chest tightness for several hours.

## 2016-02-18 ENCOUNTER — Emergency Department (HOSPITAL_COMMUNITY)

## 2016-02-18 ENCOUNTER — Emergency Department (HOSPITAL_COMMUNITY)
Admission: EM | Admit: 2016-02-18 | Discharge: 2016-02-18 | Disposition: A | Discharge status: Home / Self Care | Attending: Emergency Medicine | Admitting: Emergency Medicine

## 2016-02-18 ENCOUNTER — Encounter (HOSPITAL_COMMUNITY): Payer: Self-pay | Admitting: Emergency Medicine

## 2016-02-18 DIAGNOSIS — I110 Hypertensive heart disease with heart failure: Secondary | ICD-10-CM | POA: Insufficient documentation

## 2016-02-18 DIAGNOSIS — Z79899 Other long term (current) drug therapy: Secondary | ICD-10-CM | POA: Insufficient documentation

## 2016-02-18 DIAGNOSIS — I5030 Unspecified diastolic (congestive) heart failure: Secondary | ICD-10-CM | POA: Diagnosis not present

## 2016-02-18 DIAGNOSIS — E119 Type 2 diabetes mellitus without complications: Secondary | ICD-10-CM | POA: Diagnosis not present

## 2016-02-18 DIAGNOSIS — J441 Chronic obstructive pulmonary disease with (acute) exacerbation: Secondary | ICD-10-CM | POA: Diagnosis not present

## 2016-02-18 DIAGNOSIS — R0602 Shortness of breath: Secondary | ICD-10-CM | POA: Diagnosis present

## 2016-02-18 DIAGNOSIS — E785 Hyperlipidemia, unspecified: Secondary | ICD-10-CM | POA: Diagnosis not present

## 2016-02-18 DIAGNOSIS — J40 Bronchitis, not specified as acute or chronic: Secondary | ICD-10-CM | POA: Insufficient documentation

## 2016-02-18 LAB — I-STAT TROPONIN, ED: TROPONIN I, POC: 0 ng/mL (ref 0.00–0.08)

## 2016-02-18 LAB — COMPREHENSIVE METABOLIC PANEL
ALBUMIN: 4.3 g/dL (ref 3.5–5.0)
ALT: 9 U/L — AB (ref 17–63)
AST: 17 U/L (ref 15–41)
Alkaline Phosphatase: 62 U/L (ref 38–126)
Anion gap: 7 (ref 5–15)
BILIRUBIN TOTAL: 1.3 mg/dL — AB (ref 0.3–1.2)
BUN: 20 mg/dL (ref 6–20)
CO2: 35 mmol/L — ABNORMAL HIGH (ref 22–32)
CREATININE: 1.1 mg/dL (ref 0.61–1.24)
Calcium: 9.5 mg/dL (ref 8.9–10.3)
Chloride: 98 mmol/L — ABNORMAL LOW (ref 101–111)
GFR calc Af Amer: >60 mL/min (ref >60)
GLUCOSE: 85 mg/dL (ref 65–99)
POTASSIUM: 3.3 mmol/L — AB (ref 3.5–5.1)
Sodium: 140 mmol/L (ref 135–145)
TOTAL PROTEIN: 8 g/dL (ref 6.5–8.1)

## 2016-02-18 LAB — CBC WITH DIFFERENTIAL/PLATELET
Basophils Absolute: 0 10*3/uL (ref 0.0–0.1)
Basophils Relative: 1 %
EOS PCT: 6 %
Eosinophils Absolute: 0.4 10*3/uL (ref 0.0–0.7)
HCT: 44.1 % (ref 39.0–52.0)
Hemoglobin: 14.4 g/dL (ref 13.0–17.0)
LYMPHS ABS: 2.6 10*3/uL (ref 0.7–4.0)
Lymphocytes Relative: 34 %
MCH: 30.9 pg (ref 26.0–34.0)
MCHC: 32.7 g/dL (ref 30.0–36.0)
MCV: 94.6 fL (ref 78.0–100.0)
MONO ABS: 0.7 10*3/uL (ref 0.1–1.0)
MONOS PCT: 9 %
Neutro Abs: 3.8 10*3/uL (ref 1.7–7.7)
Neutrophils Relative %: 50 %
Platelets: 258 10*3/uL (ref 150–400)
RBC: 4.66 MIL/uL (ref 4.22–5.81)
RDW: 12.6 % (ref 11.5–15.5)
WBC: 7.5 10*3/uL (ref 4.0–10.5)

## 2016-02-18 LAB — BRAIN NATRIURETIC PEPTIDE: B Natriuretic Peptide: 15 pg/mL (ref 0.0–100.0)

## 2016-02-18 MED ORDER — IBUPROFEN 800 MG PO TABS
ORAL_TABLET | ORAL | Status: AC
  Filled 2016-02-18: qty 1

## 2016-02-18 MED ORDER — ALBUTEROL SULFATE (2.5 MG/3ML) 0.083% IN NEBU
5.0000 mg | INHALATION_SOLUTION | Freq: Once | RESPIRATORY_TRACT | Status: AC
  Administered 2016-02-18: 5 mg via RESPIRATORY_TRACT
  Filled 2016-02-18: qty 6

## 2016-02-18 MED ORDER — IPRATROPIUM-ALBUTEROL 0.5-2.5 (3) MG/3ML IN SOLN
3.0000 mL | Freq: Once | RESPIRATORY_TRACT | Status: AC
  Administered 2016-02-18: 3 mL via RESPIRATORY_TRACT
  Filled 2016-02-18: qty 3

## 2016-02-18 MED ORDER — IBUPROFEN 800 MG PO TABS
800.0000 mg | ORAL_TABLET | Freq: Once | ORAL | Status: AC
  Administered 2016-02-18: 800 mg via ORAL

## 2016-02-18 MED ORDER — HYDROMORPHONE HCL 1 MG/ML IJ SOLN
1.0000 mg | Freq: Once | INTRAMUSCULAR | Status: AC
  Administered 2016-02-18: 1 mg via INTRAMUSCULAR
  Filled 2016-02-18: qty 1

## 2016-02-18 MED ORDER — AZITHROMYCIN 250 MG PO TABS
500.0000 mg | ORAL_TABLET | Freq: Once | ORAL | Status: AC
  Administered 2016-02-18: 500 mg via ORAL
  Filled 2016-02-18: qty 2

## 2016-02-18 MED ORDER — AZITHROMYCIN 250 MG PO TABS: ORAL_TABLET | ORAL | Status: DC

## 2016-02-18 MED ORDER — IPRATROPIUM BROMIDE 0.02 % IN SOLN
0.5000 mg | Freq: Once | RESPIRATORY_TRACT | Status: AC
  Administered 2016-02-18: 0.5 mg via RESPIRATORY_TRACT
  Filled 2016-02-18: qty 2.5

## 2016-02-18 MED ORDER — POTASSIUM CHLORIDE CRYS ER 20 MEQ PO TBCR
40.0000 meq | EXTENDED_RELEASE_TABLET | Freq: Once | ORAL | Status: AC
  Administered 2016-02-18: 40 meq via ORAL
  Filled 2016-02-18: qty 2

## 2016-02-18 MED ORDER — METHYLPREDNISOLONE SODIUM SUCC 125 MG IJ SOLR
125.0000 mg | Freq: Once | INTRAMUSCULAR | Status: AC
  Administered 2016-02-18: 125 mg via INTRAVENOUS
  Filled 2016-02-18: qty 2

## 2016-02-18 MED ORDER — PREDNISONE 10 MG PO TABS: 20.0000 mg | ORAL_TABLET | Freq: Every day | ORAL | Status: DC

## 2016-02-18 NOTE — ED Notes (Signed)
Pt wants something to eat.  Tray given.  States his pain is "still terrible".  No wheezing .  No respiratory distress.

## 2016-02-18 NOTE — Discharge Instructions (Signed)
Follow-up next week if not improving. Use your inhalers and nebulized machine as prescribed

## 2016-02-18 NOTE — ED Notes (Signed)
Pt c/o pain and states his breathing is no better. Dr. Estell HarpinZammit notified.  Orders received.

## 2016-02-18 NOTE — ED Notes (Signed)
Started with SOB last night.  C/o chest pain rating pain 10/10 and c/o headache.

## 2016-02-18 NOTE — ED Provider Notes (Signed)
CSN: [id number]     Arrival date & time 02/18/16  0718 History   First MD Initiated Contact with Patient 02/18/16 [phone]     Chief Complaint  Patient presents with  . Shortness of Breath     (Consider location/radiation/quality/duration/timing/severity/associated sxs/prior Treatment) Patient is a 60 y.o. male presenting with shortness of breath. The history is provided by the patient (Patient complains of shortness of breath cough and yellow sputum production).  Shortness of Breath Severity:  Moderate Onset quality:  Sudden Timing:  Constant Progression:  Unable to specify Chronicity:  New Context: activity   Associated symptoms: wheezing   Associated symptoms: no abdominal pain, no chest pain, no cough, no headaches and no rash     Past Medical History  Diagnosis Date  . Diabetes mellitus   . Hypertension   . Asthma   . COPD (chronic obstructive pulmonary disease) (HCC)   . Hyperlipidemia   . Headache(784.0)   . BPH (benign prostatic hyperplasia)   . Diastolic CHF (HCC)   . Substance abuse     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 Cocaine   Past Surgical History  Procedure Laterality Date  . Brain surgery    . Ventriculoperitoneal shunt    . Colon surgery  06/2011    Diverticulitis Complicated  . Mandible surgery     Family History  Problem Relation Age of Onset  . Hypertension Mother   . Hyperlipidemia Mother   . Depression Mother   . Hypertension Father   . Hyperlipidemia Father   . Diabetes Father   . Arthritis    . Asthma     Social History  Substance Use Topics  . Smoking status: Never Smoker   . Smokeless tobacco: Never Used  . Alcohol Use: No    Review of Systems  Constitutional: Negative for appetite change and fatigue.  HENT: Negative for congestion, ear discharge and sinus pressure.   Eyes: Negative for discharge.  Respiratory: Positive for shortness of breath and wheezing. Negative for cough.   Cardiovascular: Negative for chest pain.  Gastrointestinal:  Negative for abdominal pain and diarrhea.  Genitourinary: Negative for frequency and hematuria.  Musculoskeletal: Negative for back pain.  Skin: Negative for rash.  Neurological: Negative for seizures and headaches.  Psychiatric/Behavioral: Negative for hallucinations.      Allergies  Morphine  Home Medications   Prior to Admission medications   Medication Sig Start Date End Date Taking? Authorizing Provider  albuterol (PROVENTIL HFA;VENTOLIN HFA) 108 (90 Base) MCG/ACT inhaler Inhale 2 puffs into the lungs every 4 (four) hours as needed for wheezing or shortness of breath. 11/12/15   Ivery Quale, PA-C  amLODipine (NORVASC) 10 MG tablet Take 1 tablet (10 mg total) by mouth daily. 10/30/13   Salley Scarlet, MD  azithromycin (ZITHROMAX) 250 MG tablet Take one pill a day.  Start tomorrow sunday 02/18/16   Bethann Berkshire, MD  budesonide-formoterol [facility]) 160-4.5 MCG/ACT inhaler Inhale 2 puffs into the lungs 2 (two) times daily. 01/18/15   Salley Scarlet, MD  cloNIDine (CATAPRES) 0.2 MG tablet Take 1 tablet (0.2 mg total) by mouth 2 (two) times daily. 03/09/15   Salley Scarlet, MD  dexamethasone (DECADRON) 4 MG tablet Take 1 tablet (4 mg total) by mouth 2 (two) times daily with a meal. 11/12/15   Ivery Quale, PA-C  etodolac (LODINE) 400 MG tablet Take 1 tablet (400 mg total) by mouth 2 (two) times daily as needed for moderate pain. 04/27/15   Gilda Crease, MD  hydrALAZINE (APRESOLINE) 50 MG tablet Take 1 tablet (50 mg total) by mouth 3 (three) times daily. 10/27/14   Salley Scarlet, MD  hydrochlorothiazide (HYDRODIURIL) 25 MG tablet Take 1 tablet (25 mg total) by mouth daily. 11/17/12   Salley Scarlet, MD  HYDROcodone-acetaminophen (NORCO) 7.5-325 MG per tablet Take 1 tablet by mouth every 6 (six) hours as needed. Pain 03/21/15   Salley Scarlet, MD  lisinopril (PRINIVIL,ZESTRIL) 40 MG tablet Take 1 tablet (40 mg total) by mouth daily. 10/30/13 10/30/14  Salley Scarlet, MD   Multiple Vitamin (MULTIVITAMIN WITH MINERALS) TABS Take 1 tablet by mouth daily.    Historical Provider, MD  oxyCODONE (ROXICODONE) 5 MG immediate release tablet Take 1 tablet (5 mg total) by mouth every 4 (four) hours as needed for severe pain. 04/23/15   Melene Plan, DO  oxyCODONE-acetaminophen (PERCOCET) 5-325 MG per tablet Take 2 tablets by mouth every 4 (four) hours as needed. 04/27/15   Gilda Crease, MD  potassium chloride 20 MEQ TBCR Take 20 mEq by mouth daily. 11/12/15   Ivery Quale, PA-C  predniSONE (DELTASONE) 10 MG tablet Take 2 tablets (20 mg total) by mouth daily. 02/18/16   Bethann Berkshire, MD  simvastatin (ZOCOR) 20 MG tablet Take 1 tablet (20 mg total) by mouth every evening. 03/09/15 03/08/16  Salley Scarlet, MD  tiotropium (SPIRIVA HANDIHALER) 18 MCG inhalation capsule Place 1 capsule (18 mcg total) into inhaler and inhale daily. 03/09/15   Salley Scarlet, MD   BP 156/98 mmHg  Pulse 65  Resp 15  SpO2 93% Physical Exam  Constitutional: He is oriented to person, place, and time. He appears well-developed.  HENT:  Head: Normocephalic.  Eyes: Conjunctivae and EOM are normal. No scleral icterus.  Neck: Neck supple. No thyromegaly present.  Cardiovascular: Normal rate and regular rhythm.  Exam reveals no gallop and no friction rub.   No murmur heard. Pulmonary/Chest: No stridor. He has wheezes. He has no rales. He exhibits no tenderness.  Abdominal: He exhibits no distension. There is no tenderness. There is no rebound.  Musculoskeletal: Normal range of motion. He exhibits no edema.  Lymphadenopathy:    He has no cervical adenopathy.  Neurological: He is oriented to person, place, and time. He exhibits normal muscle tone. Coordination normal.  Skin: No rash noted. No erythema.  Psychiatric: He has a normal mood and affect. His behavior is normal.    ED Course  Procedures (including critical care time) Labs Review Labs Reviewed  COMPREHENSIVE METABOLIC PANEL -  Abnormal; Notable for the following:    Potassium 3.3 (*)    Chloride 98 (*)    CO2 35 (*)    ALT 9 (*)    Total Bilirubin 1.3 (*)    All other components within normal limits  CBC WITH DIFFERENTIAL/PLATELET  BRAIN NATRIURETIC PEPTIDE  I-STAT TROPOININ, ED    Imaging Review Dg Chest 2 View  02/18/2016  CLINICAL DATA:  Shortness of breath, chest pain. EXAM: CHEST  2 VIEW COMPARISON:  November 12, 2015. FINDINGS: The heart size and mediastinal contours are within normal limits. Both lungs are clear. No pneumothorax or pleural effusion is noted. Right-sided ventriculoperitoneal shunt is unchanged. The visualized skeletal structures are unremarkable. IMPRESSION: No active cardiopulmonary disease. Electronically Signed   By: Lupita Raider, M.D.   On: 02/18/2016 08:24   I have personally reviewed and evaluated these images and lab results as part of my medical decision-making.   EKG Interpretation None  MDM   Final diagnoses:  Bronchitis  COPD exacerbation (HCC)    Patient with COPD exacerbation and bronchitis. Patient improved with neb treatments. Will put patient on Z-Pak and prednisone tell him to continue his inhalers and neb treatments at home patient will follow-up as needed  Bethann BerkshireJoseph Edna Grover, MD 02/18/16 1030

## 2016-02-18 NOTE — Progress Notes (Signed)
Pt in no obvious respiratory distress at time of assessment. Diminished BBS prior to neb tx. Post neb clear BBS with slight expiratory wheeze LUL. RT will continue to monitor.

## 2016-02-24 ENCOUNTER — Emergency Department (HOSPITAL_COMMUNITY)

## 2016-02-24 ENCOUNTER — Emergency Department (HOSPITAL_COMMUNITY)
Admission: EM | Admit: 2016-02-24 | Discharge: 2016-02-24 | Disposition: A | Discharge status: Home / Self Care | Attending: Emergency Medicine | Admitting: Emergency Medicine

## 2016-02-24 ENCOUNTER — Encounter (HOSPITAL_COMMUNITY): Payer: Self-pay | Admitting: *Deleted

## 2016-02-24 DIAGNOSIS — I110 Hypertensive heart disease with heart failure: Secondary | ICD-10-CM | POA: Diagnosis not present

## 2016-02-24 DIAGNOSIS — E785 Hyperlipidemia, unspecified: Secondary | ICD-10-CM | POA: Diagnosis not present

## 2016-02-24 DIAGNOSIS — Z79899 Other long term (current) drug therapy: Secondary | ICD-10-CM | POA: Insufficient documentation

## 2016-02-24 DIAGNOSIS — E119 Type 2 diabetes mellitus without complications: Secondary | ICD-10-CM | POA: Diagnosis not present

## 2016-02-24 DIAGNOSIS — I5030 Unspecified diastolic (congestive) heart failure: Secondary | ICD-10-CM | POA: Insufficient documentation

## 2016-02-24 DIAGNOSIS — J449 Chronic obstructive pulmonary disease, unspecified: Secondary | ICD-10-CM | POA: Diagnosis not present

## 2016-02-24 DIAGNOSIS — J441 Chronic obstructive pulmonary disease with (acute) exacerbation: Secondary | ICD-10-CM | POA: Diagnosis not present

## 2016-02-24 DIAGNOSIS — R0789 Other chest pain: Secondary | ICD-10-CM

## 2016-02-24 LAB — BASIC METABOLIC PANEL
Anion gap: 10 (ref 5–15)
BUN: 21 mg/dL — AB (ref 6–20)
CALCIUM: 8.9 mg/dL (ref 8.9–10.3)
CHLORIDE: 99 mmol/L — AB (ref 101–111)
CO2: 34 mmol/L — ABNORMAL HIGH (ref 22–32)
CREATININE: 1.05 mg/dL (ref 0.61–1.24)
GFR calc Af Amer: >60 mL/min (ref >60)
GFR calc non Af Amer: >60 mL/min (ref >60)
Glucose, Bld: 95 mg/dL (ref 65–99)
Potassium: 3 mmol/L — ABNORMAL LOW (ref 3.5–5.1)
SODIUM: 143 mmol/L (ref 135–145)

## 2016-02-24 LAB — CBC
HCT: 44.2 % (ref 39.0–52.0)
Hemoglobin: 13.9 g/dL (ref 13.0–17.0)
MCH: 30.3 pg (ref 26.0–34.0)
MCHC: 31.4 g/dL (ref 30.0–36.0)
MCV: 96.5 fL (ref 78.0–100.0)
PLATELETS: 258 10*3/uL (ref 150–400)
RBC: 4.58 MIL/uL (ref 4.22–5.81)
RDW: 12.9 % (ref 11.5–15.5)
WBC: 12.8 10*3/uL — AB (ref 4.0–10.5)

## 2016-02-24 LAB — TROPONIN I: Troponin I: <0.03 ng/mL (ref <0.031)

## 2016-02-24 LAB — D-DIMER, QUANTITATIVE (NOT AT ARMC)

## 2016-02-24 MED ORDER — METHYLPREDNISOLONE SODIUM SUCC 125 MG IJ SOLR
125.0000 mg | Freq: Once | INTRAMUSCULAR | Status: AC
  Administered 2016-02-24: 125 mg via INTRAVENOUS
  Filled 2016-02-24: qty 2

## 2016-02-24 MED ORDER — PREDNISONE 20 MG PO TABS: 40.0000 mg | ORAL_TABLET | Freq: Every day | ORAL | Status: DC

## 2016-02-24 MED ORDER — HYDROCODONE-ACETAMINOPHEN 5-325 MG PO TABS
1.0000 | ORAL_TABLET | Freq: Once | ORAL | Status: AC
  Administered 2016-02-24: 1 via ORAL
  Filled 2016-02-24: qty 1

## 2016-02-24 MED ORDER — FENTANYL CITRATE (PF) 100 MCG/2ML IJ SOLN
50.0000 ug | Freq: Once | INTRAMUSCULAR | Status: AC
  Administered 2016-02-24: 50 ug via INTRAVENOUS
  Filled 2016-02-24: qty 2

## 2016-02-24 MED ORDER — POTASSIUM CHLORIDE CRYS ER 20 MEQ PO TBCR
40.0000 meq | EXTENDED_RELEASE_TABLET | Freq: Once | ORAL | Status: AC
  Administered 2016-02-24: 40 meq via ORAL
  Filled 2016-02-24: qty 2

## 2016-02-24 MED ORDER — PROCHLORPERAZINE EDISYLATE 5 MG/ML IJ SOLN
5.0000 mg | Freq: Once | INTRAMUSCULAR | Status: AC
  Administered 2016-02-24: 5 mg via INTRAVENOUS
  Filled 2016-02-24: qty 2

## 2016-02-24 MED ORDER — IPRATROPIUM-ALBUTEROL 0.5-2.5 (3) MG/3ML IN SOLN
3.0000 mL | Freq: Once | RESPIRATORY_TRACT | Status: AC
  Administered 2016-02-24: 3 mL via RESPIRATORY_TRACT
  Filled 2016-02-24: qty 3

## 2016-02-24 NOTE — ED Provider Notes (Signed)
CSN: [id number]     Arrival date & time 02/24/16  0756 History   First MD Initiated Contact with Patient 02/24/16 [phone]     Chief Complaint  Patient presents with  . Chest Pain     (Consider location/radiation/quality/duration/timing/severity/associated sxs/prior Treatment) HPI Comments: Patient is a 60 year old male who presents to the emergency department with a complaint of anterior chest pain, shortness of breath, and cough. Her graft the patient has a history of chronic obstructive pulmonary disease, congestive heart failure in the past, asthma, diabetes mellitus.  The patient states that he has been having increasing chest pain and increasing shortness of breath over the past week and a half. He was seen in the emergency department on May 27 for similar on problems. He states that now he is having pain, and the medications he is being given by the prison  medical staff is not helping at all. He feels that his chest is getting tighter and as a result his pain is getting worse. It is also of note that the patient has had rib injuries and other chest injuries in the past. He has a ventriculoperitoneal shunt, but is not complaining of unusual headache or dizziness or lightheadedness. He has not had any high fever to be reported. He has not done nauseated, and he is not vomiting. There's been no unusual sweats. And his been no loss of consciousness reported.  Patient is a 60 y.o. male presenting with chest pain. The history is provided by the patient.  Chest Pain Associated symptoms: cough and shortness of breath   Associated symptoms: no diaphoresis and no fever     Past Medical History  Diagnosis Date  . Diabetes mellitus   . Hypertension   . Asthma   . COPD (chronic obstructive pulmonary disease) (HCC)   . Hyperlipidemia   . Headache(784.0)   . BPH (benign prostatic hyperplasia)   . Diastolic CHF (HCC)   . Substance abuse     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 Cocaine   Past Surgical History   Procedure Laterality Date  . Brain surgery    . Ventriculoperitoneal shunt    . Colon surgery  06/2011    Diverticulitis Complicated  . Mandible surgery     Family History  Problem Relation Age of Onset  . Hypertension Mother   . Hyperlipidemia Mother   . Depression Mother   . Hypertension Father   . Hyperlipidemia Father   . Diabetes Father   . Arthritis    . Asthma     Social History  Substance Use Topics  . Smoking status: Never Smoker   . Smokeless tobacco: Never Used  . Alcohol Use: No    Review of Systems  Constitutional: Negative for fever, chills and diaphoresis.  Respiratory: Positive for cough, chest tightness, shortness of breath and wheezing.   Cardiovascular: Positive for chest pain. Negative for leg swelling.  Skin: Negative for color change.  All other systems reviewed and are negative.     Allergies  Morphine  Home Medications   Prior to Admission medications   Medication Sig Start Date End Date Taking? Authorizing Provider  azithromycin (ZITHROMAX) 250 MG tablet Take one pill a day.  Start tomorrow sunday 02/18/16   Bethann Berkshire, MD  cloNIDine (CATAPRES) 0.2 MG tablet Take 1 tablet (0.2 mg total) by mouth 2 (two) times daily. 03/09/15   Salley Scarlet, MD  hydrochlorothiazide (HYDRODIURIL) 25 MG tablet Take 1 tablet (25 mg total) by mouth daily. 11/17/12  Salley ScarletKawanta F , MD  predniSONE (DELTASONE) 10 MG tablet Take 2 tablets (20 mg total) by mouth daily. 02/18/16   Bethann BerkshireJoseph Zammit, MD   BP 159/82 mmHg  Pulse 60  Temp(Src) 97.8 F (36.6 C) (Oral)  Resp 20  Ht 5\' 8"  (1.727 m)  Wt 103.42 kg  BMI 34.68 kg/m2  SpO2 94% Physical Exam  Constitutional: He is oriented to person, place, and time. He appears well-developed and well-nourished.  Non-toxic appearance.  HENT:  Head: Normocephalic.  Right Ear: Tympanic membrane and external ear normal.  Left Ear: Tympanic membrane and external ear normal.  Eyes: EOM and lids are normal. Pupils are  equal, round, and reactive to light.  Neck: Normal range of motion. Neck supple. Carotid bruit is not present.  Cardiovascular: Normal rate, regular rhythm, normal heart sounds, intact distal pulses and normal pulses.   Pulmonary/Chest: No respiratory distress. He has wheezes.  Chest wall soreness There is symmetrical rise and fall of the chest. Patient speaks in complete sentences.  Abdominal: Soft. Bowel sounds are normal. He exhibits no distension. There is no tenderness. There is no guarding.  Musculoskeletal: Normal range of motion. He exhibits no edema or tenderness.  Negative Homans sign  Lymphadenopathy:       Head (right side): No submandibular adenopathy present.       Head (left side): No submandibular adenopathy present.    He has no cervical adenopathy.  Neurological: He is alert and oriented to person, place, and time. He has normal strength. No cranial nerve deficit or sensory deficit.  Skin: Skin is warm and dry.  Psychiatric: He has a normal mood and affect. His speech is normal.  Nursing note and vitals reviewed.   ED Course  Procedures (including critical care time) Labs Review Labs Reviewed  CBC - Abnormal; Notable for the following:    WBC 12.8 (*)    All other components within normal limits  BASIC METABOLIC PANEL  TROPONIN I    Imaging Review No results found. I have personally reviewed and evaluated these images and lab results as part of my medical decision-making.   EKG Interpretation None      MDM Patient received nebulizer treatment by EMS in route. Another nebulizer DuoNeb treatment was given in the emergency department. During this time, the patient states he is having increasing pain in his chest. He was treated with 50 g of fentanyl. This improved the pain, but did not resolve it. The patient however on on 2 occasions of recheck was found to be resting.  The d-dimer is less than 0.27, within normal limits. The basic metabolic panel shows the  potassium is low at 3.0. Oral potassium was given to the patient for replacement. The BUN is elevated at 21 on, otherwise the basic metabolic panel is within normal limits. The anion gap is 10 area the complete blood count shows a slight elevation in the white blood cell count 12,800, there is no shift to the left. The remainder of the complete blood count is within normal limits. The troponin is less than 0.03, negative for acute event. The electrocardiogram shows a normal sinus rhythm. There are no evidences of acute event on the electrocardiogram. Chest x-ray shows stable chest. 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 No acute changes are found this time.  The patient aroused and requested more pain. Medication and something technique. He tolerated food and drink without problem.  Recheck at  this time reveals the patient to be resting. His wheezing has significantly improved. His pulse oximetries are 94-95%. He speaks in complete sentences without problem. Feel that it is safe at this time for the patient to be discharged home. Patient discharged to the custody of the prison officers.    Final diagnoses:  None    **I have reviewed nursing notes, vital signs, and all appropriate lab and imaging results for this patient.Ivery Quale, PA-C 02/24/16 1123  Bethann Berkshire, MD 02/24/16 [phone]

## 2016-02-24 NOTE — Discharge Instructions (Signed)
Please continue your inhaler as prescribed. Please use prednisone 40 mg daily with a meal. See your institutional physician, or return to the emergency department if any emergent changes, problems, or concerns. Chronic Obstructive Pulmonary Disease Chronic obstructive pulmonary disease (COPD) is a common lung condition in which airflow from the lungs is limited. COPD is a general term that can be used to describe many different lung problems that limit airflow, including both chronic bronchitis and emphysema. If you have COPD, your lung function will probably never return to normal, but there are measures you can take to improve lung function and make yourself feel better. CAUSES   Smoking (common).  Exposure to secondhand smoke.  Genetic problems.  Chronic inflammatory lung diseases or recurrent infections. SYMPTOMS  Shortness of breath, especially with physical activity.  Deep, persistent (chronic) cough with a large amount of thick mucus.  Wheezing.  Rapid breaths (tachypnea).  Gray or bluish discoloration (cyanosis) of the skin, especially in your fingers, toes, or lips.  Fatigue.  Weight loss.  Frequent infections or episodes when breathing symptoms become much worse (exacerbations).  Chest tightness. DIAGNOSIS Your health care provider will take a medical history and perform a physical examination to diagnose COPD. Additional tests for COPD may include:  Lung (pulmonary) function tests.  Chest X-ray.  CT scan.  Blood tests. TREATMENT  Treatment for COPD may include:  Inhaler and nebulizer medicines. These help manage the symptoms of COPD and make your breathing more comfortable.  Supplemental oxygen. Supplemental oxygen is only helpful if you have a low oxygen level in your blood.  Exercise and physical activity. These are beneficial for nearly all people with COPD.  Lung surgery or transplant.  Nutrition therapy to gain weight, if you are  underweight.  Pulmonary rehabilitation. This may involve working with a team of health care providers and specialists, such as respiratory, occupational, and physical therapists. HOME CARE INSTRUCTIONS  Take all medicines (inhaled or pills) as directed by your health care provider.  Avoid over-the-counter medicines or cough syrups that dry up your airway (such as antihistamines) and slow down the elimination of secretions unless instructed otherwise by your health care provider.  If you are a smoker, the most important thing that you can do is stop smoking. Continuing to smoke will cause further lung damage and breathing trouble. Ask your health care provider for help with quitting smoking. He or she can direct you to community resources or hospitals that provide support.  Avoid exposure to irritants such as smoke, chemicals, and fumes that aggravate your breathing.  Use oxygen therapy and pulmonary rehabilitation if directed by your health care provider. If you require home oxygen therapy, ask your health care provider whether you should purchase a pulse oximeter to measure your oxygen level at home.  Avoid contact with individuals who have a contagious illness.  Avoid extreme temperature and humidity changes.  Eat healthy foods. Eating smaller, more frequent meals and resting before meals may help you maintain your strength.  Stay active, but balance activity with periods of rest. Exercise and physical activity will help you maintain your ability to do things you want to do.  Preventing infection and hospitalization is very important when you have COPD. Make sure to receive all the vaccines your health care provider recommends, especially the pneumococcal and influenza vaccines. Ask your health care provider whether you need a pneumonia vaccine.  Learn and use relaxation techniques to manage stress.  Learn and use controlled breathing techniques as directed  by your health care provider.  Controlled breathing techniques include:  Pursed lip breathing. Start by breathing in (inhaling) through your nose for 1 second. Then, purse your lips as if you were going to whistle and breathe out (exhale) through the pursed lips for 2 seconds.  Diaphragmatic breathing. Start by putting one hand on your abdomen just above your waist. Inhale slowly through your nose. The hand on your abdomen should move out. Then purse your lips and exhale slowly. You should be able to feel the hand on your abdomen moving in as you exhale.  Learn and use controlled coughing to clear mucus from your lungs. Controlled coughing is a series of short, progressive coughs. The steps of controlled coughing are: 1. Lean your head slightly forward. 2. Breathe in deeply using diaphragmatic breathing. 3. Try to hold your breath for 3 seconds. 4. Keep your mouth slightly open while coughing twice. 5. Spit any mucus out into a tissue. 6. Rest and repeat the steps once or twice as needed. SEEK MEDICAL CARE IF:  You are coughing up more mucus than usual.  There is a change in the color or thickness of your mucus.  Your breathing is more labored than usual.  Your breathing is faster than usual. SEEK IMMEDIATE MEDICAL CARE IF:  You have shortness of breath while you are resting.  You have shortness of breath that prevents you from:  Being able to talk.  Performing your usual physical activities.  You have chest pain lasting longer than 5 minutes.  Your skin color is more cyanotic than usual.  You measure low oxygen saturations for longer than 5 minutes with a pulse oximeter. MAKE SURE YOU:  Understand these instructions.  Will watch your condition.  Will get help right away if you are not doing well or get worse.   This information is not intended to replace advice given to you by your health care provider. Make sure you discuss any questions you have with your health care provider.   Document Released:  06/20/2005 Document Revised: 10/01/2014 Document Reviewed: 05/07/2013 Elsevier Interactive Patient Education [employer]

## 2016-02-24 NOTE — ED Notes (Signed)
Pt comes in with chest pain and shortness of breath starting last night.  Pt has history of COPD and Asthma. He was given a albuterol treatment this morning before EMS arrival. EMS gave 1 duoneb treatment in route to here.

## 2016-02-24 NOTE — ED Notes (Signed)
Pt asking for more pain medicine and something to eat.

## 2016-02-24 NOTE — ED Notes (Signed)
Pt called out for nurse.  In room to check on pt.  Pt requesting pain medicine and states he is hungry and has not eaten today.

## 2016-04-13 ENCOUNTER — Encounter (HOSPITAL_COMMUNITY): Payer: Self-pay | Admitting: Emergency Medicine

## 2016-04-13 ENCOUNTER — Emergency Department (HOSPITAL_COMMUNITY)

## 2016-04-13 ENCOUNTER — Emergency Department (HOSPITAL_COMMUNITY)
Admission: EM | Admit: 2016-04-13 | Discharge: 2016-04-13 | Disposition: A | Discharge status: Home / Self Care | Attending: Emergency Medicine | Admitting: Emergency Medicine

## 2016-04-13 DIAGNOSIS — J449 Chronic obstructive pulmonary disease, unspecified: Secondary | ICD-10-CM | POA: Insufficient documentation

## 2016-04-13 DIAGNOSIS — E119 Type 2 diabetes mellitus without complications: Secondary | ICD-10-CM | POA: Insufficient documentation

## 2016-04-13 DIAGNOSIS — Z791 Long term (current) use of non-steroidal anti-inflammatories (NSAID): Secondary | ICD-10-CM | POA: Insufficient documentation

## 2016-04-13 DIAGNOSIS — J45909 Unspecified asthma, uncomplicated: Secondary | ICD-10-CM | POA: Diagnosis not present

## 2016-04-13 DIAGNOSIS — Z79899 Other long term (current) drug therapy: Secondary | ICD-10-CM | POA: Insufficient documentation

## 2016-04-13 DIAGNOSIS — J441 Chronic obstructive pulmonary disease with (acute) exacerbation: Secondary | ICD-10-CM | POA: Insufficient documentation

## 2016-04-13 DIAGNOSIS — I110 Hypertensive heart disease with heart failure: Secondary | ICD-10-CM | POA: Diagnosis not present

## 2016-04-13 DIAGNOSIS — I5030 Unspecified diastolic (congestive) heart failure: Secondary | ICD-10-CM | POA: Insufficient documentation

## 2016-04-13 DIAGNOSIS — R0602 Shortness of breath: Secondary | ICD-10-CM | POA: Diagnosis present

## 2016-04-13 HISTORY — DX: Other chronic pain: G89.29

## 2016-04-13 LAB — CBC WITH DIFFERENTIAL/PLATELET
Basophils Absolute: 0.1 10*3/uL (ref 0.0–0.1)
Basophils Relative: 1 %
EOS ABS: 0.4 10*3/uL (ref 0.0–0.7)
EOS PCT: 5 %
HCT: 42.8 % (ref 39.0–52.0)
HEMOGLOBIN: 14.1 g/dL (ref 13.0–17.0)
LYMPHS ABS: 2.5 10*3/uL (ref 0.7–4.0)
Lymphocytes Relative: 30 %
MCH: 31.4 pg (ref 26.0–34.0)
MCHC: 32.9 g/dL (ref 30.0–36.0)
MCV: 95.3 fL (ref 78.0–100.0)
MONO ABS: 0.7 10*3/uL (ref 0.1–1.0)
MONOS PCT: 9 %
NEUTROS ABS: 4.4 10*3/uL (ref 1.7–7.7)
Neutrophils Relative %: 55 %
PLATELETS: 208 10*3/uL (ref 150–400)
RBC: 4.49 MIL/uL (ref 4.22–5.81)
RDW: 12.8 % (ref 11.5–15.5)
WBC: 8.1 10*3/uL (ref 4.0–10.5)

## 2016-04-13 LAB — BASIC METABOLIC PANEL
Anion gap: 4 — ABNORMAL LOW (ref 5–15)
BUN: 20 mg/dL (ref 6–20)
CALCIUM: 9.2 mg/dL (ref 8.9–10.3)
CHLORIDE: 103 mmol/L (ref 101–111)
CO2: 35 mmol/L — ABNORMAL HIGH (ref 22–32)
CREATININE: 1.16 mg/dL (ref 0.61–1.24)
GFR calc Af Amer: >60 mL/min (ref >60)
GFR calc non Af Amer: >60 mL/min (ref >60)
Glucose, Bld: 133 mg/dL — ABNORMAL HIGH (ref 65–99)
Potassium: 2.7 mmol/L — CL (ref 3.5–5.1)
SODIUM: 142 mmol/L (ref 135–145)

## 2016-04-13 LAB — BRAIN NATRIURETIC PEPTIDE: B Natriuretic Peptide: 15 pg/mL (ref 0.0–100.0)

## 2016-04-13 LAB — TROPONIN I

## 2016-04-13 MED ORDER — ALBUTEROL SULFATE HFA 108 (90 BASE) MCG/ACT IN AERS
4.0000 | INHALATION_SPRAY | RESPIRATORY_TRACT | Status: AC
  Administered 2016-04-13: 4 via RESPIRATORY_TRACT
  Filled 2016-04-13: qty 6.7

## 2016-04-13 MED ORDER — METHYLPREDNISOLONE SODIUM SUCC 125 MG IJ SOLR
125.0000 mg | Freq: Once | INTRAMUSCULAR | Status: AC
  Administered 2016-04-13: 125 mg via INTRAVENOUS
  Filled 2016-04-13: qty 2

## 2016-04-13 MED ORDER — POTASSIUM CHLORIDE CRYS ER 20 MEQ PO TBCR
40.0000 meq | EXTENDED_RELEASE_TABLET | Freq: Once | ORAL | Status: AC
  Administered 2016-04-13: 40 meq via ORAL
  Filled 2016-04-13: qty 2

## 2016-04-13 MED ORDER — ACETAMINOPHEN 325 MG PO TABS
650.0000 mg | ORAL_TABLET | Freq: Once | ORAL | Status: AC
  Administered 2016-04-13: 650 mg via ORAL
  Filled 2016-04-13: qty 2

## 2016-04-13 MED ORDER — AZITHROMYCIN 250 MG PO TABS: ORAL_TABLET | ORAL | Status: DC

## 2016-04-13 MED ORDER — ALBUTEROL (5 MG/ML) CONTINUOUS INHALATION SOLN
10.0000 mg/h | INHALATION_SOLUTION | Freq: Once | RESPIRATORY_TRACT | Status: AC
  Administered 2016-04-13: 10 mg/h via RESPIRATORY_TRACT
  Filled 2016-04-13: qty 20

## 2016-04-13 MED ORDER — ALBUTEROL SULFATE (2.5 MG/3ML) 0.083% IN NEBU: 5.0000 mg | INHALATION_SOLUTION | Freq: Once | RESPIRATORY_TRACT | Status: DC

## 2016-04-13 MED ORDER — PREDNISONE 20 MG PO TABS: 40.0000 mg | ORAL_TABLET | Freq: Every day | ORAL | Status: DC

## 2016-04-13 MED ORDER — IPRATROPIUM BROMIDE 0.02 % IN SOLN
1.0000 mg | Freq: Once | RESPIRATORY_TRACT | Status: AC
  Administered 2016-04-13: 1 mg via RESPIRATORY_TRACT
  Filled 2016-04-13: qty 5

## 2016-04-13 MED ORDER — ALBUTEROL SULFATE (2.5 MG/3ML) 0.083% IN NEBU: 2.5000 mg | INHALATION_SOLUTION | RESPIRATORY_TRACT | Status: DC | PRN

## 2016-04-13 NOTE — ED Provider Notes (Signed)
CSN: [id number]     Arrival date & time 04/13/16  1947 History   First MD Initiated Contact with Patient 04/13/16 1955     Chief Complaint  Patient presents with  . Shortness of Breath  . Wheezing     HPI  Pt was seen at 1955.  Per pt, c/o gradual onset and worsening of persistent cough, wheezing and SOB for the past 3 days. Has been using home MDI without relief.  Denies CP/palpitations, no back pain, no abd pain, no N/V/D, no fevers, no rash.    Past Medical History  Diagnosis Date  . Diabetes mellitus   . Hypertension   . Asthma   . COPD (chronic obstructive pulmonary disease) (HCC)   . Hyperlipidemia   . Headache(784.0)   . BPH (benign prostatic hyperplasia)   . Diastolic CHF (HCC)   . Substance abuse     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 Cocaine  . Chronic pain    Past Surgical History  Procedure Laterality Date  . Brain surgery    . Ventriculoperitoneal shunt    . Colon surgery  06/2011    Diverticulitis Complicated  . Mandible surgery     Family History  Problem Relation Age of Onset  . Hypertension Mother   . Hyperlipidemia Mother   . Depression Mother   . Hypertension Father   . Hyperlipidemia Father   . Diabetes Father   . Arthritis    . Asthma     Social History  Substance Use Topics  . Smoking status: Never Smoker   . Smokeless tobacco: Never Used  . Alcohol Use: No    Review of Systems ROS: Statement: All systems negative except as marked or noted in the HPI; Constitutional: Negative for fever and chills. ; ; Eyes: Negative for eye pain, redness and discharge. ; ; ENMT: Negative for ear pain, hoarseness, nasal congestion, sinus pressure and sore throat. ; ; Cardiovascular: Negative for chest pain, palpitations, diaphoresis, and peripheral edema. ; ; Respiratory: +cough, wheezing, SOB. Negative for stridor. ; ; Gastrointestinal: Negative for nausea, vomiting, diarrhea, abdominal pain, blood in stool, hematemesis, jaundice and rectal bleeding. . ; ; Genitourinary:  Negative for dysuria, flank pain and hematuria. ; ; Musculoskeletal: Negative for back pain and neck pain. Negative for swelling and trauma.; ; Skin: Negative for pruritus, rash, abrasions, blisters, bruising and skin lesion.; ; Neuro: Negative for headache, lightheadedness and neck stiffness. Negative for weakness, altered level of consciousness, altered mental status, extremity weakness, paresthesias, involuntary movement, seizure and syncope.       Allergies  Morphine  Home Medications   Prior to Admission medications   Medication Sig Start Date End Date Taking? Authorizing Provider  albuterol (PROVENTIL HFA;VENTOLIN HFA) 108 (90 Base) MCG/ACT inhaler Inhale 2 puffs into the lungs every 6 (six) hours as needed for wheezing or shortness of breath.    Historical Provider, MD  amLODipine (NORVASC) 10 MG tablet Take 10 mg by mouth daily.    Historical Provider, MD  azithromycin (ZITHROMAX) 250 MG tablet Take one pill a day.  Start tomorrow sunday 02/18/16   Bethann BerkshireJoseph Zammit, MD  cloNIDine (CATAPRES) 0.2 MG tablet Take 1 tablet (0.2 mg total) by mouth 2 (two) times daily. Patient not taking: Reported on 02/24/2016 03/09/15   Salley ScarletKawanta F Bonnie[place], MD  hydrochlorothiazide (HYDRODIURIL) 25 MG tablet Take 1 tablet (25 mg total) by mouth daily. 11/17/12   Salley ScarletKawanta F Spencer, MD  ketoconazole (NIZORAL) 2 % cream Apply 1 application topically daily as  needed for irritation.    Historical Provider, MD  naproxen (NAPROSYN) 500 MG tablet Take 500 mg by mouth 2 (two) times daily as needed for mild pain.    Historical Provider, MD  predniSONE (DELTASONE) 20 MG tablet Take 2 tablets (40 mg total) by mouth daily. 02/24/16   Ivery Quale, PA-C  tolnaftate (TINACTIN) 1 % powder Apply 1 application topically 2 (two) times daily.    Historical Provider, MD   BP 139/87 mmHg  Pulse 73  Temp(Src) 97.8 F (36.6 C) (Oral)  Resp 24  Ht 5\' 8"  (1.727 m)  Wt 230 lb (104.327 kg)  BMI 34.98 kg/m2  SpO2 92% Physical Exam   2000: Physical examination:  Nursing notes reviewed; Vital signs and O2 SAT reviewed;  Constitutional: Well developed, Well nourished, Well hydrated, Uncomfortable appearing.; Head:  Normocephalic, atraumatic; Eyes: EOMI, PERRL, No scleral icterus; ENMT: Mouth and pharynx normal, Mucous membranes moist; Neck: Supple, Full range of motion, No lymphadenopathy; Cardiovascular: Regular rate and rhythm, No gallop; Respiratory: Breath sounds diminished & equal bilaterally, insp/exp wheezes bilat, +audible wheezing. Speaking few words, Sitting upright, mild tachypnea..; Chest: Nontender, Movement normal; Abdomen: Soft, Nontender, Nondistended, Normal bowel sounds; Genitourinary: No CVA tenderness; Extremities: Pulses normal, No tenderness, No edema, No calf edema or asymmetry.; Neuro: AA&Ox3, Major CN grossly intact.  Speech clear. No gross focal motor or sensory deficits in extremities.; Skin: Color normal, Warm, Dry.   ED Course  Procedures (including critical care time) Labs Review  Imaging Review  I have personally reviewed and evaluated these images and lab results as part of my medical decision-making.   EKG Interpretation   Date/Time:  Friday April 13 2016 20:00:57 EDT Ventricular Rate:  66 PR Interval:    QRS Duration: 105 QT Interval:  419 QTC Calculation: 439 R Axis:   -36 Text Interpretation:  Sinus rhythm Prolonged PR interval Left ventricular  hypertrophy When compared with ECG of 02/24/2016 No significant change was  found Confirmed by [facility]  MD, Nicholos Johns [phone]) on 04/13/2016 8:06:17 PM      MDM  MDM Reviewed: previous chart, nursing note and vitals Reviewed previous: labs and ECG Interpretation: labs, ECG and x-ray      Results for orders placed or performed during the hospital encounter of 04/13/16  CBC with Differential  Result Value Ref Range   WBC 8.1 4.0 - 10.5 K/uL   RBC 4.49 4.22 - 5.81 MIL/uL   Hemoglobin 14.1 13.0 - 17.0 g/dL   HCT 60.4 54.0 - 98.1 %    MCV 95.3 78.0 - 100.0 fL   MCH 31.4 26.0 - 34.0 pg   MCHC 32.9 30.0 - 36.0 g/dL   RDW 19.1 47.8 - 29.5 %   Platelets 208 150 - 400 K/uL   Neutrophils Relative % 55 %   Neutro Abs 4.4 1.7 - 7.7 K/uL   Lymphocytes Relative 30 %   Lymphs Abs 2.5 0.7 - 4.0 K/uL   Monocytes Relative 9 %   Monocytes Absolute 0.7 0.1 - 1.0 K/uL   Eosinophils Relative 5 %   Eosinophils Absolute 0.4 0.0 - 0.7 K/uL   Basophils Relative 1 %   Basophils Absolute 0.1 0.0 - 0.1 K/uL  Basic metabolic panel  Result Value Ref Range   Sodium 142 135 - 145 mmol/L   Potassium 2.7 (LL) 3.5 - 5.1 mmol/L   Chloride 103 101 - 111 mmol/L   CO2 35 (H) 22 - 32 mmol/L   Glucose, Bld 133 (H) 65 - 99  mg/dL   BUN 20 6 - 20 mg/dL   Creatinine, Ser 1.61 0.61 - 1.24 mg/dL   Calcium 9.2 8.9 - 09.6 mg/dL   GFR calc non Af Amer >60 >60 mL/min   GFR calc Af Amer >60 >60 mL/min   Anion gap 4 (L) 5 - 15  Troponin I  Result Value Ref Range   Troponin I <0.03 <0.03 ng/mL  Brain natriuretic peptide  Result Value Ref Range   B Natriuretic Peptide 15.0 0.0 - 100.0 pg/mL   Dg Chest Port 1 View 04/13/2016  CLINICAL DATA:  Difficulty breathing, wheezing EXAM: PORTABLE CHEST 1 VIEW COMPARISON:  02/24/2016 FINDINGS: Chronic right hemidiaphragm elevation. Mild cardiomegaly without CHF or pneumonia. No edema, effusion or pneumothorax. Stable right paratracheal density, presumed tortuous vascularity. Degenerative changes noted of the spine. IMPRESSION: Stable chest exam.  No interval change. Electronically Signed   By: Judie Petit.  Shick M.D.   On: 04/13/2016 20:25    2245:  Pt states he "feels better" after neb and steroid.  NAD, lungs coarse bilat, no wheezing, resps easy, speaking full sentences, Sats 100% R/A.  Pt ambulated around the ED with Sats remaining 91-95 % R/A, resps easy, NAD.  Pt wants to leave now. Potassium repleted PO. Pt has access to neb machine; will rx albuterol soln. Pt states he needs another MDI; will dose here. Dx and testing d/w  pt.  Questions answered.  Verb understanding, agreeable to d/c home with outpt f/u.    Samuel Jester, DO 04/16/16 1544

## 2016-04-13 NOTE — ED Notes (Signed)
Pt ambulated on room air. O2 sat starting 95% dropped to 91% during walk

## 2016-04-13 NOTE — Discharge Instructions (Signed)
Take the prescriptions as directed.  Use your albuterol inhaler (2 to 4 puffs) or your albuterol nebulizer (1 unit dose) every 4 hours for the next 7 days, then as needed for cough, wheezing, or shortness of breath.  Call the medical doctor tomorrow morning to schedule a follow up appointment within the next 2 days.  Return to the Emergency Department immediately sooner if worsening.

## 2016-04-13 NOTE — ED Notes (Signed)
Patient complaining of shortness of breath and chest tightness x 3 days. Patient has audible wheezing at triage.

## 2016-05-20 ENCOUNTER — Inpatient Hospital Stay (HOSPITAL_COMMUNITY)
Admission: EM | Admit: 2016-05-20 | Discharge: 2016-05-24 | DRG: 190 | Attending: Family Medicine | Admitting: Family Medicine

## 2016-05-20 ENCOUNTER — Emergency Department (HOSPITAL_COMMUNITY)

## 2016-05-20 ENCOUNTER — Encounter (HOSPITAL_COMMUNITY): Payer: Self-pay | Admitting: Emergency Medicine

## 2016-05-20 DIAGNOSIS — I110 Hypertensive heart disease with heart failure: Secondary | ICD-10-CM | POA: Diagnosis present

## 2016-05-20 DIAGNOSIS — E785 Hyperlipidemia, unspecified: Secondary | ICD-10-CM | POA: Diagnosis present

## 2016-05-20 DIAGNOSIS — Z23 Encounter for immunization: Secondary | ICD-10-CM

## 2016-05-20 DIAGNOSIS — N400 Enlarged prostate without lower urinary tract symptoms: Secondary | ICD-10-CM | POA: Diagnosis present

## 2016-05-20 DIAGNOSIS — E119 Type 2 diabetes mellitus without complications: Secondary | ICD-10-CM | POA: Diagnosis present

## 2016-05-20 DIAGNOSIS — R51 Headache: Secondary | ICD-10-CM

## 2016-05-20 DIAGNOSIS — Z825 Family history of asthma and other chronic lower respiratory diseases: Secondary | ICD-10-CM

## 2016-05-20 DIAGNOSIS — Z982 Presence of cerebrospinal fluid drainage device: Secondary | ICD-10-CM

## 2016-05-20 DIAGNOSIS — R0602 Shortness of breath: Secondary | ICD-10-CM | POA: Diagnosis not present

## 2016-05-20 DIAGNOSIS — Z8249 Family history of ischemic heart disease and other diseases of the circulatory system: Secondary | ICD-10-CM

## 2016-05-20 DIAGNOSIS — I5032 Chronic diastolic (congestive) heart failure: Secondary | ICD-10-CM | POA: Diagnosis present

## 2016-05-20 DIAGNOSIS — Z833 Family history of diabetes mellitus: Secondary | ICD-10-CM

## 2016-05-20 DIAGNOSIS — I10 Essential (primary) hypertension: Secondary | ICD-10-CM | POA: Diagnosis not present

## 2016-05-20 DIAGNOSIS — J9600 Acute respiratory failure, unspecified whether with hypoxia or hypercapnia: Secondary | ICD-10-CM | POA: Diagnosis present

## 2016-05-20 DIAGNOSIS — J9601 Acute respiratory failure with hypoxia: Secondary | ICD-10-CM | POA: Diagnosis not present

## 2016-05-20 DIAGNOSIS — R519 Headache, unspecified: Secondary | ICD-10-CM | POA: Diagnosis present

## 2016-05-20 DIAGNOSIS — E876 Hypokalemia: Secondary | ICD-10-CM | POA: Diagnosis present

## 2016-05-20 DIAGNOSIS — J441 Chronic obstructive pulmonary disease with (acute) exacerbation: Secondary | ICD-10-CM | POA: Diagnosis not present

## 2016-05-20 DIAGNOSIS — Z818 Family history of other mental and behavioral disorders: Secondary | ICD-10-CM

## 2016-05-20 DIAGNOSIS — D72829 Elevated white blood cell count, unspecified: Secondary | ICD-10-CM | POA: Diagnosis present

## 2016-05-20 HISTORY — DX: Unspecified abdominal hernia without obstruction or gangrene: K46.9

## 2016-05-20 LAB — CBC WITH DIFFERENTIAL/PLATELET
BASOS ABS: 0.1 10*3/uL (ref 0.0–0.1)
Basophils Relative: 2 %
Eosinophils Absolute: 0.2 10*3/uL (ref 0.0–0.7)
Eosinophils Relative: 4 %
HEMATOCRIT: 43.7 % (ref 39.0–52.0)
Hemoglobin: 14.5 g/dL (ref 13.0–17.0)
LYMPHS ABS: 2.6 10*3/uL (ref 0.7–4.0)
LYMPHS PCT: 42 %
MCH: 31.2 pg (ref 26.0–34.0)
MCHC: 33.2 g/dL (ref 30.0–36.0)
MCV: 94 fL (ref 78.0–100.0)
MONO ABS: 0.7 10*3/uL (ref 0.1–1.0)
MONOS PCT: 11 %
NEUTROS ABS: 2.5 10*3/uL (ref 1.7–7.7)
Neutrophils Relative %: 41 %
Platelets: 242 10*3/uL (ref 150–400)
RBC: 4.65 MIL/uL (ref 4.22–5.81)
RDW: 12.8 % (ref 11.5–15.5)
WBC: 6.2 10*3/uL (ref 4.0–10.5)

## 2016-05-20 LAB — COMPREHENSIVE METABOLIC PANEL
ALT: 9 U/L — AB (ref 17–63)
AST: 22 U/L (ref 15–41)
Albumin: 4.5 g/dL (ref 3.5–5.0)
Alkaline Phosphatase: 48 U/L (ref 38–126)
Anion gap: 4 — ABNORMAL LOW (ref 5–15)
BILIRUBIN TOTAL: 1.7 mg/dL — AB (ref 0.3–1.2)
BUN: 15 mg/dL (ref 6–20)
CO2: 35 mmol/L — ABNORMAL HIGH (ref 22–32)
CREATININE: 1.02 mg/dL (ref 0.61–1.24)
Calcium: 8.9 mg/dL (ref 8.9–10.3)
Chloride: 101 mmol/L (ref 101–111)
GFR calc Af Amer: >60 mL/min (ref >60)
Glucose, Bld: 102 mg/dL — ABNORMAL HIGH (ref 65–99)
POTASSIUM: 2.6 mmol/L — AB (ref 3.5–5.1)
Sodium: 140 mmol/L (ref 135–145)
TOTAL PROTEIN: 8 g/dL (ref 6.5–8.1)

## 2016-05-20 LAB — TROPONIN I: Troponin I: <0.03 ng/mL (ref <0.03)

## 2016-05-20 LAB — MRSA PCR SCREENING: MRSA by PCR: NEGATIVE

## 2016-05-20 LAB — MAGNESIUM: Magnesium: 2 mg/dL (ref 1.7–2.4)

## 2016-05-20 MED ORDER — POTASSIUM CHLORIDE CRYS ER 20 MEQ PO TBCR
40.0000 meq | EXTENDED_RELEASE_TABLET | ORAL | Status: AC
  Administered 2016-05-20 (×2): 40 meq via ORAL
  Filled 2016-05-20 (×2): qty 2

## 2016-05-20 MED ORDER — ACETAMINOPHEN 650 MG RE SUPP: 650.0000 mg | Freq: Four times a day (QID) | RECTAL | Status: DC | PRN

## 2016-05-20 MED ORDER — ONDANSETRON HCL 4 MG PO TABS
4.0000 mg | ORAL_TABLET | Freq: Four times a day (QID) | ORAL | Status: DC | PRN
Start: 2016-05-20 — End: 2016-05-24

## 2016-05-20 MED ORDER — POTASSIUM CHLORIDE 10 MEQ/100ML IV SOLN
10.0000 meq | Freq: Once | INTRAVENOUS | Status: AC
  Administered 2016-05-20: 10 meq via INTRAVENOUS
  Filled 2016-05-20: qty 100

## 2016-05-20 MED ORDER — AZITHROMYCIN 250 MG PO TABS
250.0000 mg | ORAL_TABLET | Freq: Every day | ORAL | Status: DC
  Administered 2016-05-22 – 2016-05-24 (×3): 250 mg via ORAL
  Filled 2016-05-20 (×4): qty 1

## 2016-05-20 MED ORDER — ENOXAPARIN SODIUM 40 MG/0.4ML ~~LOC~~ SOLN
40.0000 mg | SUBCUTANEOUS | Status: DC
  Administered 2016-05-20 – 2016-05-23 (×3): 40 mg via SUBCUTANEOUS
  Filled 2016-05-20 (×4): qty 0.4

## 2016-05-20 MED ORDER — ACETAMINOPHEN 325 MG PO TABS: 650.0000 mg | ORAL_TABLET | Freq: Four times a day (QID) | ORAL | Status: DC | PRN

## 2016-05-20 MED ORDER — AZITHROMYCIN 250 MG PO TABS
500.0000 mg | ORAL_TABLET | Freq: Every day | ORAL | Status: AC
  Administered 2016-05-21: 500 mg via ORAL
  Filled 2016-05-20 (×2): qty 2

## 2016-05-20 MED ORDER — IPRATROPIUM-ALBUTEROL 0.5-2.5 (3) MG/3ML IN SOLN
3.0000 mL | Freq: Four times a day (QID) | RESPIRATORY_TRACT | Status: DC
  Administered 2016-05-20 – 2016-05-22 (×7): 3 mL via RESPIRATORY_TRACT
  Filled 2016-05-20 (×7): qty 3

## 2016-05-20 MED ORDER — HYDROCODONE-ACETAMINOPHEN 5-325 MG PO TABS
1.0000 | ORAL_TABLET | ORAL | Status: DC | PRN
  Administered 2016-05-20 – 2016-05-21 (×3): 1 via ORAL
  Administered 2016-05-21: 2 via ORAL
  Administered 2016-05-21 (×2): 1 via ORAL
  Administered 2016-05-22 (×2): 2 via ORAL
  Administered 2016-05-22 – 2016-05-23 (×4): 1 via ORAL
  Administered 2016-05-24 (×2): 2 via ORAL
  Filled 2016-05-20: qty 2
  Filled 2016-05-20: qty 1
  Filled 2016-05-20: qty 2
  Filled 2016-05-20 (×3): qty 1
  Filled 2016-05-20: qty 2
  Filled 2016-05-20 (×3): qty 1
  Filled 2016-05-20: qty 2
  Filled 2016-05-20: qty 1
  Filled 2016-05-20: qty 2
  Filled 2016-05-20: qty 1

## 2016-05-20 MED ORDER — ALBUTEROL SULFATE (2.5 MG/3ML) 0.083% IN NEBU: 2.5000 mg | INHALATION_SOLUTION | RESPIRATORY_TRACT | Status: DC | PRN

## 2016-05-20 MED ORDER — KETOROLAC TROMETHAMINE 30 MG/ML IJ SOLN
30.0000 mg | Freq: Once | INTRAMUSCULAR | Status: AC
  Administered 2016-05-20: 30 mg via INTRAVENOUS
  Filled 2016-05-20: qty 1

## 2016-05-20 MED ORDER — IPRATROPIUM-ALBUTEROL 0.5-2.5 (3) MG/3ML IN SOLN
3.0000 mL | RESPIRATORY_TRACT | Status: AC
  Administered 2016-05-20 (×2): 3 mL via RESPIRATORY_TRACT
  Filled 2016-05-20: qty 6

## 2016-05-20 MED ORDER — SODIUM CHLORIDE 0.9% FLUSH: 3.0000 mL | INTRAVENOUS | Status: DC | PRN

## 2016-05-20 MED ORDER — ONDANSETRON HCL 4 MG/2ML IJ SOLN: 4.0000 mg | Freq: Four times a day (QID) | INTRAMUSCULAR | Status: DC | PRN

## 2016-05-20 MED ORDER — SODIUM CHLORIDE 0.9% FLUSH
3.0000 mL | Freq: Two times a day (BID) | INTRAVENOUS | Status: DC
  Administered 2016-05-22 – 2016-05-23 (×3): 3 mL via INTRAVENOUS

## 2016-05-20 MED ORDER — GUAIFENESIN ER 600 MG PO TB12
600.0000 mg | ORAL_TABLET | Freq: Two times a day (BID) | ORAL | Status: DC
  Administered 2016-05-20 – 2016-05-23 (×6): 600 mg via ORAL
  Filled 2016-05-20 (×6): qty 1

## 2016-05-20 MED ORDER — POTASSIUM CHLORIDE CRYS ER 20 MEQ PO TBCR
80.0000 meq | EXTENDED_RELEASE_TABLET | Freq: Once | ORAL | Status: AC
  Administered 2016-05-20: 80 meq via ORAL
  Filled 2016-05-20: qty 4

## 2016-05-20 MED ORDER — SODIUM CHLORIDE 0.9% FLUSH
3.0000 mL | Freq: Two times a day (BID) | INTRAVENOUS | Status: DC
  Administered 2016-05-20 – 2016-05-22 (×5): 3 mL via INTRAVENOUS

## 2016-05-20 MED ORDER — SODIUM CHLORIDE 0.9 % IV SOLN: 250.0000 mL | INTRAVENOUS | Status: DC | PRN

## 2016-05-20 MED ORDER — AMLODIPINE BESYLATE 5 MG PO TABS
10.0000 mg | ORAL_TABLET | Freq: Every day | ORAL | Status: DC
  Administered 2016-05-21 – 2016-05-24 (×4): 10 mg via ORAL
  Filled 2016-05-20 (×5): qty 2

## 2016-05-20 MED ORDER — AZITHROMYCIN 250 MG PO TABS
500.0000 mg | ORAL_TABLET | Freq: Once | ORAL | Status: AC
  Administered 2016-05-20: 500 mg via ORAL
  Filled 2016-05-20: qty 2

## 2016-05-20 MED ORDER — METHYLPREDNISOLONE SODIUM SUCC 125 MG IJ SOLR
60.0000 mg | Freq: Four times a day (QID) | INTRAMUSCULAR | Status: DC
  Administered 2016-05-20 – 2016-05-22 (×8): 60 mg via INTRAVENOUS
  Filled 2016-05-20 (×9): qty 2

## 2016-05-20 MED ORDER — PNEUMOCOCCAL VAC POLYVALENT 25 MCG/0.5ML IJ INJ
0.5000 mL | INJECTION | INTRAMUSCULAR | Status: AC
  Administered 2016-05-21: 0.5 mL via INTRAMUSCULAR
  Filled 2016-05-20: qty 0.5

## 2016-05-20 MED ORDER — IPRATROPIUM-ALBUTEROL 0.5-2.5 (3) MG/3ML IN SOLN
3.0000 mL | Freq: Once | RESPIRATORY_TRACT | Status: AC
  Administered 2016-05-20: 3 mL via RESPIRATORY_TRACT
  Filled 2016-05-20: qty 3

## 2016-05-20 NOTE — H&P (Signed)
History and Physical    Ketan Renz ONG:[id number] DOB: 01/16/1956 DOA: 05/20/2016  Referring MD/NP/PA: Lavera Guise, MD PCP: No PCP Per Patient Outpatient Specialists: None Patient coming from: [facility]  Chief Complaint: Shortness of Breath  HPI: [name] is a 60 y.o. male with medical history significant of COPD, and hypertension, presents to the ED with complaints of dyspnea that onset 3 days ago. He has associated HA and cough that is productive of yellow/green colored sputum. He has associated wheezing that has persisted despite using his albuterol inhaler.  Pt reports a constant pain in his chest that has been ocurring for 2 days and is worse when he coughs. He also has diffuse myalgias and feels that he may have been feverish. Pt is a resident of a correctional facility. Due to his persistent wheezing and SOB, EMS was called and he received steroids and bronchodilators.   ED Course: While in the ED, afebrile, hypertensive, potassium 2.6, ALT 9, Troponin <0.03, and total bilirubin 1.7, CBC unremarkable. CXR negative. EKG showed sinus rhythm. He was admitted for further evaluation of his SOB.  Review of Systems: As per HPI otherwise 10 point review of systems negative.   Past Medical History:  Diagnosis Date  . Abdominal hernia   . Asthma   . BPH (benign prostatic hyperplasia)   . Chronic pain   . COPD (chronic obstructive pulmonary disease) (HCC)   . Diabetes mellitus   . Diastolic CHF (HCC)   . Headache(784.0)   . Hyperlipidemia   . Hypertension   . Substance abuse    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 Cocaine    Past Surgical History:  Procedure Laterality Date  . BRAIN SURGERY    . COLON SURGERY  06/2011   Diverticulitis Complicated  . MANDIBLE SURGERY    . VENTRICULOPERITONEAL SHUNT       reports that he has never smoked. He has never used smokeless tobacco. He reports that he does not drink alcohol or use drugs.  Allergies  Allergen Reactions    . Morphine Itching    Family History  Problem Relation Age of Onset  . Hypertension Mother   . Hyperlipidemia Mother   . Depression Mother   . Hypertension Father   . Hyperlipidemia Father   . Diabetes Father   . Arthritis    . Asthma       Prior to Admission medications   Medication Sig Start Date End Date Taking? Authorizing Provider  albuterol (PROVENTIL HFA;VENTOLIN HFA) 108 (90 Base) MCG/ACT inhaler Inhale 2 puffs into the lungs every 6 (six) hours as needed for wheezing or shortness of breath.   Yes Historical Provider, MD  amLODipine (NORVASC) 10 MG tablet Take 10 mg by mouth daily.   Yes Historical Provider, MD  albuterol (PROVENTIL) (2.5 MG/3ML) 0.083% nebulizer solution Take 3 mLs (2.5 mg total) by nebulization every 4 (four) hours as needed for wheezing or shortness of breath. Patient not taking: Reported on 05/20/2016 04/13/16   Samuel Jester, DO  azithromycin (ZITHROMAX) 250 MG tablet Take 2 tablets PO day 1, then 1 tab PO daily x4 days. Patient not taking: Reported on 05/20/2016 04/13/16   Samuel Jester, DO  predniSONE (DELTASONE) 20 MG tablet Take 2 tablets (40 mg total) by mouth daily. Patient not taking: Reported on 05/20/2016 04/13/16   Samuel Jester, DO    Physical Exam: Vitals:   05/20/16 1030 05/20/16 1041 05/20/16 1100 05/20/16 1130  BP: 160/91  165/86 141/75  Pulse:  83  93 87  Resp: 18  20 16   Temp:      TempSrc:      SpO2: 95% 96% 94% 90%  Weight:      Height:          Constitutional: NAD, calm, comfortable  Vitals:   05/20/16 1030 05/20/16 1041 05/20/16 1100 05/20/16 1130  BP: 160/91  165/86 141/75  Pulse: 83  93 87  Resp: 18  20 16   Temp:      TempSrc:      SpO2: 95% 96% 94% 90%  Weight:      Height:       Eyes: PERRL, lids and conjunctivae normal ENMT: Mucous membranes are moist. Posterior pharynx clear of any exudate or lesions.Normal dentition.  Neck: normal, supple, no masses, no thyromegaly Respiratory: No crackles. Normal  respiratory effort. No accessory muscle use. Bilateral wheezing Cardiovascular: rhythm, no murmurs / rubs / gallops. No extremity edema. 2+ pedal pulses. No carotid bruits. Mildly tachycardic Abdomen: no tenderness, no masses palpated. No hepatosplenomegaly. Bowel sounds positive.  Musculoskeletal: no clubbing / cyanosis. No joint deformity upper and lower extremities. Good ROM, no contractures. Normal muscle tone.  Skin: no rashes, lesions, ulcers. No induration Neurologic: CN 2-12 grossly intact. Sensation intact, DTR normal. Strength 5/5 in all 4.  Psychiatric: Normal judgment and insight. Alert and oriented x 3. Normal mood.   Labs on Admission: I have personally reviewed following labs and imaging studies  CBC:  Recent Labs Lab 05/20/16 0745  WBC 6.2  NEUTROABS 2.5  HGB 14.5  HCT 43.7  MCV 94.0  PLT 242   Basic Metabolic Panel:  Recent Labs Lab 05/20/16 0745  NA 140  K 2.6*  CL 101  CO2 35*  GLUCOSE 102*  BUN 15  CREATININE 1.02  CALCIUM 8.9  MG 2.0   GFR: Estimated Creatinine Clearance: 91.3 mL/min (by C-G formula based on SCr of 1.02 mg/dL). Liver Function Tests:  Recent Labs Lab 05/20/16 0745  AST 22  ALT 9*  ALKPHOS 48  BILITOT 1.7*  PROT 8.0  ALBUMIN 4.5   No results for input(s): LIPASE, AMYLASE in the last 168 hours. No results for input(s): AMMONIA in the last 168 hours. Coagulation Profile: No results for input(s): INR, PROTIME in the last 168 hours. Cardiac Enzymes:  Recent Labs Lab 05/20/16 0745 05/20/16 1040  TROPONINI <0.03 <0.03   BNP (last 3 results) No results for input(s): PROBNP in the last 8760 hours. HbA1C: No results for input(s): HGBA1C in the last 72 hours. CBG: No results for input(s): GLUCAP in the last 168 hours. Lipid Profile: No results for input(s): CHOL, HDL, LDLCALC, TRIG, CHOLHDL, LDLDIRECT in the last 72 hours. Thyroid Function Tests: No results for input(s): TSH, T4TOTAL, FREET4, T3FREE, THYROIDAB in the  last 72 hours. Anemia Panel: No results for input(s): VITAMINB12, FOLATE, FERRITIN, TIBC, IRON, RETICCTPCT in the last 72 hours. Urine analysis:    Component Value Date/Time   COLORURINE YELLOW 04/23/2015 1940   APPEARANCEUR CLEAR 04/23/2015 1940   LABSPEC 1.015 04/23/2015 1940   PHURINE 6.0 04/23/2015 1940   GLUCOSEU NEGATIVE 04/23/2015 1940   HGBUR NEGATIVE 04/23/2015 1940   BILIRUBINUR NEGATIVE 04/23/2015 1940   KETONESUR NEGATIVE 04/23/2015 1940   PROTEINUR NEGATIVE 04/23/2015 1940   UROBILINOGEN 0.2 04/23/2015 1940   NITRITE NEGATIVE 04/23/2015 1940   LEUKOCYTESUR NEGATIVE 04/23/2015 1940   Sepsis Labs: @LABRCNTIP (procalcitonin:4,lacticidven:4) )No results found for this or any previous visit (from the past 240 hour(s)).  Radiological Exams on Admission: Dg Chest Portable 1 View  Result Date: 05/20/2016 CLINICAL DATA:  Shortness of breath since Friday. EXAM: PORTABLE CHEST 1 VIEW COMPARISON:  04/13/2016 FINDINGS: Elevated right hemidiaphragm. No confluent airspace opacities. Heart is upper limits normal in size. Tortuosity of the thoracic aorta. VP shunt is noted in the right chest wall. No acute bony abnormality. IMPRESSION: Stable elevation of the right hemidiaphragm.  No active disease. Electronically Signed   By: Charlett Nose M.D.   On: 05/20/2016 07:43    EKG: Independently reviewed. Sinus Rhythm. Without any acute changes.  Assessment/Plan Active Problems:   HTN (hypertension)   Headache, chronic daily   S/P VP shunt   COPD exacerbation (HCC)   Acute respiratory failure (HCC)   Hypokalemia    1. Acute respiratory failure w/ hypoxia. Related to COPD exacerbation. Currently tolerating 2 liters, will wean off oxygen as tolerated. 2. COPD exacerbation. CXR was negative. Pt is experiencing shortness of breath and wheezing. Start on abx, steroids, nebs, and pulmonary hygiene. 3. Pleuritic CP. Likely related to COPD exacerbation. EKG is non acute. He has had 2  negative troponins in the ED. No further workup planed. 4. Hypokalemia. Potassium 2.6. Replace. Mg normal. 5. Chronic HA s/p vp shunt for hx of hydrocephalus. Continue supportive management. 6. HTN. Continue on home regimen of Norvasc.   DVT prophylaxis: Lovenox Code Status: Full Family Communication: No family present Disposition Plan: Discharge to [facility] once improved Consults called: None Admission status: Admit to observation/ telemetry   Erick Blinks, MD [facility] If 7PM-7AM, please contact night-coverage [email or web address] Password [email or web address] 05/20/2016, 12:20 PM   By signing my name below, I, Bobbie Stack, attest that this documentation has been prepared under the direction and in the presence of Erick Blinks, MD. Electronically signed: Bobbie Stack, Scribe.  05/20/16, 1:54PM  I, Dr. Erick Blinks, personally performed the services described in this documentaiton. All medical record entries made by the scribe were at my direction and in my presence. I have reviewed the chart and agree that the record reflects my personal performance and is accurate and complete  Erick Blinks, MD, 05/20/2016 1:59 PM

## 2016-05-20 NOTE — ED Provider Notes (Signed)
[facility] DE[facility] Provider Note   CSN: [id number] Arrival date & time: 05/20/16  0710     History   Chief Complaint Chief Complaint  Patient presents with  . Shortness of Breath    HPI [name] is a 60 y.o. male.  HPI 60 year old male who presents with dyspnea. History of COPD, diastolic heart failure, HTN, and HLD. States onset of dyspnea three days ago associated with cough and yellow phlegm. Associated headache, typical of usual migraine like headaches and chest discomfort in the center of his chest, worse with movement and cough, sharp and tight in nature. Has the same chest pain with prior COPD flare up, he states. Pain is not pleuritic. No syncope, near syncope, focal numbness or weakness, leg swelling or pain. He states that this presentation is typical for that of his COPD flare ups. Unclear if certain triggers. No known sick contacts. Feeling hot and cold intermittently. No prior history of PE/DVT, malignancy history, or recent immobilization.   Past Medical History:  Diagnosis Date  . Abdominal hernia   . Asthma   . BPH (benign prostatic hyperplasia)   . Chronic pain   . COPD (chronic obstructive pulmonary disease) (HCC)   . Diabetes mellitus   . Diastolic CHF (HCC)   . Headache(784.0)   . Hyperlipidemia   . Hypertension   . Substance abuse    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 Cocaine    Patient Active Problem List   Diagnosis Date Noted  . COPD exacerbation (HCC) 05/20/2016  . Obesity 04/28/2013  . COPD with exacerbation (HCC) 08/05/2012  . Rotator cuff tear, left 05/22/2012  . Rotator cuff syndrome of left shoulder 05/22/2012  . Chronic pain 02/04/2012  . Environmental allergies 02/04/2012  . COPD (chronic obstructive pulmonary disease) (HCC) 12/05/2011  . Hyperlipidemia 11/20/2011  . Headache, chronic daily 11/20/2011  . 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 Seizure prophylaxis 11/20/2011  . Substance abuse 11/20/2011  . Diabetes mellitus type II, controlled (HCC)  10/26/2009  . Essential hypertension 10/26/2009  . CHF 10/26/2009    Past Surgical History:  Procedure Laterality Date  . BRAIN SURGERY    . COLON SURGERY  06/2011   Diverticulitis Complicated  . MANDIBLE SURGERY    . VENTRICULOPERITONEAL SHUNT         Home Medications    Prior to Admission medications   Medication Sig Start Date End Date Taking? Authorizing Provider  albuterol (PROVENTIL HFA;VENTOLIN HFA) 108 (90 Base) MCG/ACT inhaler Inhale 2 puffs into the lungs every 6 (six) hours as needed for wheezing or shortness of breath.   Yes Historical Provider, MD  amLODipine (NORVASC) 10 MG tablet Take 10 mg by mouth daily.   Yes Historical Provider, MD  albuterol (PROVENTIL) (2.5 MG/3ML) 0.083% nebulizer solution Take 3 mLs (2.5 mg total) by nebulization every 4 (four) hours as needed for wheezing or shortness of breath. Patient not taking: Reported on 05/20/2016 04/13/16   Samuel JesterKathleen McManus, DO  azithromycin (ZITHROMAX) 250 MG tablet Take 2 tablets PO day 1, then 1 tab PO daily x4 days. Patient not taking: Reported on 05/20/2016 04/13/16   Samuel JesterKathleen McManus, DO  predniSONE (DELTASONE) 20 MG tablet Take 2 tablets (40 mg total) by mouth daily. Patient not taking: Reported on 05/20/2016 04/13/16   Samuel JesterKathleen McManus, DO    Family History Family History  Problem Relation Age of Onset  . Hypertension Mother   . Hyperlipidemia Mother   . Depression Mother   . Hypertension Father   .  Hyperlipidemia Father   . Diabetes Father   . Arthritis    . Asthma      Social History Social History  Substance Use Topics  . Smoking status: Never Smoker  . Smokeless tobacco: Never Used  . Alcohol use No     Allergies   Morphine   Review of Systems Review of Systems 10/14 systems reviewed and are negative other than those stated in the HPI   Physical Exam Updated Vital Signs BP 141/75   Pulse 87   Temp 97.4 F (36.3 C) (Oral)   Resp 16   Ht 5\' 8"  (1.727 m)   Wt 230 lb (104.3 kg)    SpO2 90%   BMI 34.97 kg/m   Physical Exam Physical Exam  Nursing note and vitals reviewed. Constitutional: non-toxic, and in no acute distress Head: Normocephalic and atraumatic.  Mouth/Throat: Oropharynx is clear and moist.  Neck: Normal range of motion. Neck supple.  Cardiovascular: Normal rate and regular rhythm.  no edema Pulmonary/Chest: Effort normal. No conversational dyspnea. Poor air movement with diffuse expiratory wheezes throughout. Abdominal: Soft. There is no tenderness. There is no rebound and no guarding.  Musculoskeletal: Normal range of motion. no calf tenderness Neurological: Alert, no facial droop, fluent speech, moves all extremities symmetrically, sensation to light touch intact throughout Skin: Skin is warm and dry.  Psychiatric: Cooperative   ED Treatments / Results  Labs (all labs ordered are listed, but only abnormal results are displayed) Labs Reviewed  COMPREHENSIVE METABOLIC PANEL - Abnormal; Notable for the following:       Result Value   Potassium 2.6 (*)    CO2 35 (*)    Glucose, Bld 102 (*)    ALT 9 (*)    Total Bilirubin 1.7 (*)    Anion gap 4 (*)    All other components within normal limits  CBC WITH DIFFERENTIAL/PLATELET  TROPONIN I  MAGNESIUM  TROPONIN I    EKG  EKG Interpretation  Date/Time:  Sunday May 20 2016 07:19:59 EDT Ventricular Rate:  66 PR Interval:    QRS Duration: 104 QT Interval:  422 QTC Calculation: 443 R Axis:   -33 Text Interpretation:  Sinus rhythm Prolonged PR interval Abnormal R-wave progression, early transition LVH with secondary repolarization abnormality Similar to prior EKG  Confirmed by Vale Mousseau MD, Tiffini Blacksher [phone]) on 05/20/2016 7:33:53 AM       Radiology Dg Chest Portable 1 View  Result Date: 05/20/2016 CLINICAL DATA:  Shortness of breath since Friday. EXAM: PORTABLE CHEST 1 VIEW COMPARISON:  04/13/2016 FINDINGS: Elevated right hemidiaphragm. No confluent airspace opacities. Heart is upper limits normal  in size. Tortuosity of the thoracic aorta. VP shunt is noted in the right chest wall. No acute bony abnormality. IMPRESSION: Stable elevation of the right hemidiaphragm.  No active disease. Electronically Signed   By: Charlett Nose M.D.   On: 05/20/2016 07:43    Procedures Procedures (including critical care time)  Medications Ordered in ED Medications  ipratropium-albuterol (DUONEB) 0.5-2.5 (3) MG/3ML nebulizer solution 3 mL (3 mLs Nebulization Given 05/20/16 0835)  ketorolac (TORADOL) 30 MG/ML injection 30 mg (30 mg Intravenous Given 05/20/16 0807)  potassium chloride SA (K-DUR,KLOR-CON) CR tablet 80 mEq (80 mEq Oral Given 05/20/16 0925)  potassium chloride 10 mEq in 100 mL IVPB (0 mEq Intravenous Stopped 05/20/16 1055)  ipratropium-albuterol (DUONEB) 0.5-2.5 (3) MG/3ML nebulizer solution 3 mL (3 mLs Nebulization Given 05/20/16 1041)  azithromycin (ZITHROMAX) tablet 500 mg (500 mg Oral Given 05/20/16 1022)  Initial Impression / Assessment and Plan / ED Course  I have reviewed the triage vital signs and the nursing notes.  Pertinent labs & imaging results that were available during my care of the patient were reviewed by me and considered in my medical decision making (see chart for details).  Clinical Course    60 year old male with history of hydrocephalus status post VP shunt and COPD who presents with shortness of breath cough, and chest discomfort consistent with that of his typical COPD exacerbations. Is on oxygen on presentation, but breathing comfortably with normal work of breathing. Lungs are tight with diffuse wheezing. Had received Solu-Medrol, and breathing treatment prior to arrival. Received 2 additional treatments here in the emergency department with persistent wheezing and shortness of breath. While at rest off of oxygen and pulse ox does drop to 89-90%. He continues to feel poorly.  Chest x-ray shows no acute cardiopulmonary processes. Troponin is negative and EKG is not  acutely ischemic. Chest pain seems typical for that of the COPD exacerbation and I'm not concerned for ACS or other acute cardiopulmonary processes at this time. He has also hypokalemic. But this appears to be near his baseline as he has had potassium of 2.8 and 2.7 before in the past. He is given 10 mEq of IV potassium as well as 80 mEq of oral potassium. Due to low pulse ox and continued wheezing and shortness of breath I will admit him to observation for COPD treatment. Discussed with Dr. Kerry Hough.    Final Clinical Impressions(s) / ED Diagnoses   Final diagnoses:  COPD exacerbation [facility])    New Prescriptions New Prescriptions   No medications on file     Lavera Guise, MD 05/20/16 1217

## 2016-05-20 NOTE — ED Notes (Signed)
CRITICAL VALUE ALERT  Critical value received:  Potassium 2.6  Date of notification:  05/20/2016  Time of notification:  0847  Critical value read back:Yes.    Nurse who received alert:  Tarri Glennrystal Enya Bureau RN   MD notified (1st page):  Dr Verdie MosherLiu  Time of first page:  [phone]  MD notified (2nd page):  Time of second page:  Responding MD:  Dr Verdie MosherLiu  Time MD responded:  [phone]

## 2016-05-20 NOTE — ED Triage Notes (Signed)
[facility] Alert and oriented. Airway patent. Patient c/o shortness of breath since Friday with productive cough. Per patient thick yellow sputum. Denies any fevers. Patient has hx of COPD in which he uses albuterol inhaler. Patient does report some mid-sternal chest pain, non-radiating. Denies any cardiac hx.  Audible wheezing noted. Patient also c/o headache-patient has hx of migraines with stents per patient. Patient received 125mg  of solumedrol via IV in route to ER. Patient also had 2 albuterol and 1 duoneb in route.

## 2016-05-20 NOTE — ED Notes (Signed)
Resp paged for breathing treatment.  

## 2016-05-21 DIAGNOSIS — D72829 Elevated white blood cell count, unspecified: Secondary | ICD-10-CM | POA: Diagnosis not present

## 2016-05-21 DIAGNOSIS — Z23 Encounter for immunization: Secondary | ICD-10-CM | POA: Diagnosis not present

## 2016-05-21 DIAGNOSIS — I5032 Chronic diastolic (congestive) heart failure: Secondary | ICD-10-CM | POA: Diagnosis present

## 2016-05-21 DIAGNOSIS — Z818 Family history of other mental and behavioral disorders: Secondary | ICD-10-CM | POA: Diagnosis not present

## 2016-05-21 DIAGNOSIS — Z833 Family history of diabetes mellitus: Secondary | ICD-10-CM | POA: Diagnosis not present

## 2016-05-21 DIAGNOSIS — N400 Enlarged prostate without lower urinary tract symptoms: Secondary | ICD-10-CM | POA: Diagnosis present

## 2016-05-21 DIAGNOSIS — Z825 Family history of asthma and other chronic lower respiratory diseases: Secondary | ICD-10-CM | POA: Diagnosis not present

## 2016-05-21 DIAGNOSIS — I110 Hypertensive heart disease with heart failure: Secondary | ICD-10-CM | POA: Diagnosis present

## 2016-05-21 DIAGNOSIS — Z8249 Family history of ischemic heart disease and other diseases of the circulatory system: Secondary | ICD-10-CM | POA: Diagnosis not present

## 2016-05-21 DIAGNOSIS — Z982 Presence of cerebrospinal fluid drainage device: Secondary | ICD-10-CM

## 2016-05-21 DIAGNOSIS — R51 Headache: Secondary | ICD-10-CM | POA: Diagnosis not present

## 2016-05-21 DIAGNOSIS — I10 Essential (primary) hypertension: Secondary | ICD-10-CM | POA: Diagnosis not present

## 2016-05-21 DIAGNOSIS — E876 Hypokalemia: Secondary | ICD-10-CM | POA: Diagnosis present

## 2016-05-21 DIAGNOSIS — E119 Type 2 diabetes mellitus without complications: Secondary | ICD-10-CM | POA: Diagnosis present

## 2016-05-21 DIAGNOSIS — R0602 Shortness of breath: Secondary | ICD-10-CM | POA: Diagnosis present

## 2016-05-21 DIAGNOSIS — E785 Hyperlipidemia, unspecified: Secondary | ICD-10-CM | POA: Diagnosis present

## 2016-05-21 DIAGNOSIS — J441 Chronic obstructive pulmonary disease with (acute) exacerbation: Secondary | ICD-10-CM | POA: Diagnosis present

## 2016-05-21 DIAGNOSIS — J9601 Acute respiratory failure with hypoxia: Secondary | ICD-10-CM | POA: Diagnosis present

## 2016-05-21 LAB — BASIC METABOLIC PANEL
Anion gap: 8 (ref 5–15)
BUN: 20 mg/dL (ref 6–20)
CALCIUM: 9.5 mg/dL (ref 8.9–10.3)
CO2: 28 mmol/L (ref 22–32)
CREATININE: 1.05 mg/dL (ref 0.61–1.24)
Chloride: 103 mmol/L (ref 101–111)
Glucose, Bld: 194 mg/dL — ABNORMAL HIGH (ref 65–99)
Potassium: 3.5 mmol/L (ref 3.5–5.1)
SODIUM: 139 mmol/L (ref 135–145)

## 2016-05-21 LAB — CBC
HCT: 42.5 % (ref 39.0–52.0)
Hemoglobin: 14.2 g/dL (ref 13.0–17.0)
MCH: 31.5 pg (ref 26.0–34.0)
MCHC: 33.4 g/dL (ref 30.0–36.0)
MCV: 94.2 fL (ref 78.0–100.0)
PLATELETS: 274 10*3/uL (ref 150–400)
RBC: 4.51 MIL/uL (ref 4.22–5.81)
RDW: 12.8 % (ref 11.5–15.5)
WBC: 24.6 10*3/uL — AB (ref 4.0–10.5)

## 2016-05-21 MED ORDER — HYDROCODONE-HOMATROPINE 5-1.5 MG/5ML PO SYRP: 5.0000 mL | ORAL_SOLUTION | ORAL | Status: DC | PRN

## 2016-05-21 MED ORDER — BUDESONIDE 0.25 MG/2ML IN SUSP
0.2500 mg | Freq: Two times a day (BID) | RESPIRATORY_TRACT | Status: DC
  Administered 2016-05-21 – 2016-05-24 (×7): 0.25 mg via RESPIRATORY_TRACT
  Filled 2016-05-21 (×6): qty 2

## 2016-05-21 NOTE — Progress Notes (Signed)
PROGRESS NOTE    [name]  WUJ:[id number] DOB: August 29, 1956 DOA: 05/20/2016 PCP: No PCP Per Patient    Brief Narrative:  75 yom with a past hx of COPD and HTN presents with complaints of dyspnea. While in the ED he was noted to be hypokalemic. ALT 9 and total bilirubin 1.7. CXR was negative and EKG showed sinus rhythm. He was admitted for further evaluation of acute respiratory failure with hypoxia.   Assessment & Plan:   Active Problems:   HTN (hypertension)   Headache, chronic daily   S/P VP shunt   COPD exacerbation (HCC)   Acute respiratory failure (HCC)   Hypokalemia 1. Acute respiratory failure w/ hypoxia. Related to COPD exacerbation. Currently tolerating 2 liters, will wean off oxygen as tolerated. 2. COPD exacerbation. CXR was negative. Shortness of breath and wheezing persist. Continue on abx, steroids, nebs, and pulmonary hygiene. Add antitussives and inhaled steroids 3. Pleuritic chest pain. Likely related to COPD exacerbation. EKG is non acute. He has had negative troponins in the ED. No further workup planned. 4. Hypokalemia. Replaced.  5. Chronic HA s/p vp shunt for hx of hydrocephalus. Continue supportive management. 6. HTN. Continue on home regimen of Norvasc.   DVT prophylaxis: Lovenox Code Status: Full Family Communication: No family bedside. Officer at bedside Disposition Plan: Discharge home once improved   Consultants:   None   Procedures:   None   Antimicrobials:   Azithromycin 8/28 >>    Subjective: Still very short of breath and wheezing. Had persistent coughing overnight  Objective: Vitals:   05/20/16 2004 05/20/16 2046 05/21/16 0500 05/21/16 0756  BP: (!) 163/87  (!) 162/94   Pulse: 88  88   Resp: 20  20   Temp: 97.9 F (36.6 C)  97.8 F (36.6 C)   TempSrc: Oral  Oral   SpO2: 98% 95% 98% 94%  Weight:      Height:        Intake/Output Summary (Last 24 hours) at 05/21/16 0847 Last data filed at 05/21/16 0515  Gross per 24  hour  Intake             1820 ml  Output              800 ml  Net             1020 ml   Filed Weights   05/20/16 0722 05/20/16 1419  Weight: 104.3 kg (230 lb) 104.8 kg (231 lb 1.6 oz)    Examination:  General exam: Appears calm and comfortable  Respiratory system: bilateral wheezes. Mildly increased respiratory effort. Cardiovascular system: S1 & S2 heard, RRR. No JVD, murmurs, rubs, gallops or clicks. No pedal edema. Gastrointestinal system: Abdomen is nondistended, soft and nontender. No organomegaly or masses felt. Normal bowel sounds heard. Central nervous system: Alert and oriented. No focal neurological deficits. Extremities: Symmetric 5 x 5 power. Skin: No rashes, lesions or ulcers Psychiatry: Judgement and insight appear normal. Mood & affect appropriate.     Data Reviewed: I have personally reviewed following labs and imaging studies  CBC:  Recent Labs Lab 05/20/16 0745 05/21/16 0546  WBC 6.2 24.6*  NEUTROABS 2.5  --   HGB 14.5 14.2  HCT 43.7 42.5  MCV 94.0 94.2  PLT 242 274   Basic Metabolic Panel:  Recent Labs Lab 05/20/16 0745 05/21/16 0546  NA 140 139  K 2.6* 3.5  CL 101 103  CO2 35* 28  GLUCOSE 102* 194*  BUN 15  20  CREATININE 1.02 1.05  CALCIUM 8.9 9.5  MG 2.0  --    GFR: Estimated Creatinine Clearance: 88.9 mL/min (by C-G formula based on SCr of 1.05 mg/dL). Liver Function Tests:  Recent Labs Lab 05/20/16 0745  AST 22  ALT 9*  ALKPHOS 48  BILITOT 1.7*  PROT 8.0  ALBUMIN 4.5   No results for input(s): LIPASE, AMYLASE in the last 168 hours. No results for input(s): AMMONIA in the last 168 hours. Coagulation Profile: No results for input(s): INR, PROTIME in the last 168 hours. Cardiac Enzymes:  Recent Labs Lab 05/20/16 0745 05/20/16 1040  TROPONINI <0.03 <0.03   BNP (last 3 results) No results for input(s): PROBNP in the last 8760 hours. HbA1C: No results for input(s): HGBA1C in the last 72 hours. CBG: No results for  input(s): GLUCAP in the last 168 hours. Lipid Profile: No results for input(s): CHOL, HDL, LDLCALC, TRIG, CHOLHDL, LDLDIRECT in the last 72 hours. Thyroid Function Tests: No results for input(s): TSH, T4TOTAL, FREET4, T3FREE, THYROIDAB in the last 72 hours. Anemia Panel: No results for input(s): VITAMINB12, FOLATE, FERRITIN, TIBC, IRON, RETICCTPCT in the last 72 hours. Sepsis Labs: No results for input(s): PROCALCITON, LATICACIDVEN in the last 168 hours.  Recent Results (from the past 240 hour(s))  MRSA PCR Screening     Status: None   Collection Time: 05/20/16  6:30 PM  Result Value Ref Range Status   MRSA by PCR NEGATIVE NEGATIVE Final    Comment:        The GeneXpert MRSA Assay (FDA approved for NASAL specimens only), is one component of a comprehensive MRSA colonization surveillance program. It is not intended to diagnose MRSA infection nor to guide or monitor treatment for MRSA infections.          Radiology Studies: Dg Chest Portable 1 View  Result Date: 05/20/2016 CLINICAL DATA:  Shortness of breath since Friday. EXAM: PORTABLE CHEST 1 VIEW COMPARISON:  04/13/2016 FINDINGS: Elevated right hemidiaphragm. No confluent airspace opacities. Heart is upper limits normal in size. Tortuosity of the thoracic aorta. VP shunt is noted in the right chest wall. No acute bony abnormality. IMPRESSION: Stable elevation of the right hemidiaphragm.  No active disease. Electronically Signed   By: Charlett NoseKevin  Dover M.D.   On: 05/20/2016 07:43        Scheduled Meds: . amLODipine  10 mg Oral Daily  . azithromycin  500 mg Oral Daily   Followed by  . azithromycin  250 mg Oral Daily  . enoxaparin (LOVENOX) injection  40 mg Subcutaneous Q24H  . guaiFENesin  600 mg Oral BID  . ipratropium-albuterol  3 mL Nebulization Q6H  . methylPREDNISolone (SOLU-MEDROL) injection  60 mg Intravenous Q6H  . pneumococcal 23 valent vaccine  0.5 mL Intramuscular Tomorrow-1000  . sodium chloride flush  3 mL  Intravenous Q12H  . sodium chloride flush  3 mL Intravenous Q12H   Continuous Infusions:    LOS: 0 days    Time spent: 25 minutes     Erick BlinksJehanzeb Jaonna Word, MD [facility] If 7PM-7AM, please contact night-coverage [email or web address] Password [email or web address] 05/21/2016, 8:47 AM

## 2016-05-21 NOTE — Progress Notes (Signed)
Inpatient Diabetes Program Recommendations  AACE/ADA: New Consensus Statement on Inpatient Glycemic Control (2015)  Target Ranges:  Prepandial:   less than 140 mg/dL      Peak postprandial:   less than 180 mg/dL (1-2 hours)      Critically ill patients:  140 - 180 mg/dL  Results for [name], Hughey (MRN [id number]) as of 05/21/2016 08:37  Ref. Range 05/20/2016 07:45 05/21/2016 05:46  Glucose Latest Ref Range: 65 - 99 mg/dL 962102 (H) 952194 (H)    Review of Glycemic Control  Diabetes history: No Outpatient Diabetes medications: NA Current orders for Inpatient glycemic control: None  Inpatient Diabetes Program Recommendations: Correction (SSI): While inpatient and ordered steroids, may want to consider ordering CBGs with Novolog correction scale.  Thanks, Orlando PennerMarie Jamile Rekowski, RN, MSN, CDE Diabetes Coordinator Inpatient Diabetes Program [phone] (Team Pager from 8am to 5pm) [phone] ([facility] office) [phone] [facility] office) [phone] [facility] office)

## 2016-05-22 MED ORDER — METHYLPREDNISOLONE SODIUM SUCC 125 MG IJ SOLR
60.0000 mg | Freq: Two times a day (BID) | INTRAMUSCULAR | Status: DC
  Administered 2016-05-23: 60 mg via INTRAVENOUS
  Filled 2016-05-22: qty 2

## 2016-05-22 MED ORDER — IPRATROPIUM-ALBUTEROL 0.5-2.5 (3) MG/3ML IN SOLN
3.0000 mL | Freq: Four times a day (QID) | RESPIRATORY_TRACT | Status: DC
  Administered 2016-05-22 – 2016-05-24 (×5): 3 mL via RESPIRATORY_TRACT
  Filled 2016-05-22 (×4): qty 3

## 2016-05-22 NOTE — Progress Notes (Signed)
PROGRESS NOTE    [name]  ZOX:[id number] DOB: 1955-10-30 DOA: 05/20/2016 PCP: No PCP Per Patient    Brief Narrative:  [age] yom with a past hx of COPD and HTN presents with complaints of dyspnea. Patient is a resident of a correctional facility. He was found to have a COPD exacerbation and started on steroids, nebs and abx. He is slowly improving and will likely need another 24 hours of treatment. Anticipate discharge back to correctional facility in AM if continues to improve   Assessment & Plan:   Active Problems:   HTN (hypertension)   Headache, chronic daily   S/P VP shunt   COPD exacerbation (HCC)   Acute respiratory failure (HCC)   Hypokalemia   Leukocytosis 1. Acute respiratory failure w/ hypoxia. Related to COPD exacerbation. Currently tolerating 2 liters, will wean off oxygen as tolerated. 2. COPD exacerbation. CXR was negative. Shortness of breath and wheezing still present but improving. Continue on abx, steroids, nebs, and pulmonary hygiene. Will start to wean steroids. 3. Pleuritic chest pain. Likely related to COPD exacerbation. EKG is non acute. He has had 2 negative troponins in the ED. No further workup planned. 4. Hypokalemia. Replaced.  5. Chronic HA s/p vp shunt for hx of hydrocephalus. Continue supportive management. 6. HTN. Continue on home regimen of Norvasc.    DVT prophylaxis: Lovenox Code Status: Full Family Communication: No family bedside. Officer bedside.  Disposition Plan: Discharge back to correctional facility when improved. Likely in AM   Consultants:   None   Procedures:   None   Antimicrobials:   Azithromycin 8/28 >>   Subjective: Continues to cough. Feels shortness of breath and wheezing are improving  Objective: Vitals:   05/21/16 1926 05/21/16 1932 05/21/16 2220 05/22/16 0617  BP:   (!) 167/89 (!) 155/94  Pulse:   86 66  Resp:   20 20  Temp:   98.2 F (36.8 C) 97.8 F (36.6 C)  TempSrc:   Oral Oral  SpO2: (!) 88%  97% 97% 97%  Weight:      Height:        Intake/Output Summary (Last 24 hours) at 05/22/16 0730 Last data filed at 05/21/16 2220  Gross per 24 hour  Intake              480 ml  Output                0 ml  Net              480 ml   Filed Weights   05/20/16 0722 05/20/16 1419  Weight: 104.3 kg (230 lb) 104.8 kg (231 lb 1.6 oz)    Examination:  General exam: Appears calm and comfortable  Respiratory system: mild wheeze bilaterally, improving. Respiratory effort normal. Cardiovascular system: S1 & S2 heard, RRR. No JVD, murmurs, rubs, gallops or clicks. No pedal edema. Gastrointestinal system: Abdomen is nondistended, soft and nontender. No organomegaly or masses felt. Normal bowel sounds heard. Central nervous system: Alert and oriented. No focal neurological deficits. Extremities: Symmetric 5 x 5 power. Skin: No rashes, lesions or ulcers Psychiatry: Judgement and insight appear normal. Mood & affect appropriate.     Data Reviewed: I have personally reviewed following labs and imaging studies  CBC:  Recent Labs Lab 05/20/16 0745 05/21/16 0546  WBC 6.2 24.6*  NEUTROABS 2.5  --   HGB 14.5 14.2  HCT 43.7 42.5  MCV 94.0 94.2  PLT 242 274   Basic Metabolic Panel:  Recent Labs Lab 05/20/16 0745 05/21/16 0546  NA 140 139  K 2.6* 3.5  CL 101 103  CO2 35* 28  GLUCOSE 102* 194*  BUN 15 20  CREATININE 1.02 1.05  CALCIUM 8.9 9.5  MG 2.0  --    GFR: Estimated Creatinine Clearance: 88.9 mL/min (by C-G formula based on SCr of 1.05 mg/dL). Liver Function Tests:  Recent Labs Lab 05/20/16 0745  AST 22  ALT 9*  ALKPHOS 48  BILITOT 1.7*  PROT 8.0  ALBUMIN 4.5   No results for input(s): LIPASE, AMYLASE in the last 168 hours. No results for input(s): AMMONIA in the last 168 hours. Coagulation Profile: No results for input(s): INR, PROTIME in the last 168 hours. Cardiac Enzymes:  Recent Labs Lab 05/20/16 0745 05/20/16 1040  TROPONINI <0.03 <0.03   BNP  (last 3 results) No results for input(s): PROBNP in the last 8760 hours. HbA1C: No results for input(s): HGBA1C in the last 72 hours. CBG: No results for input(s): GLUCAP in the last 168 hours. Lipid Profile: No results for input(s): CHOL, HDL, LDLCALC, TRIG, CHOLHDL, LDLDIRECT in the last 72 hours. Thyroid Function Tests: No results for input(s): TSH, T4TOTAL, FREET4, T3FREE, THYROIDAB in the last 72 hours. Anemia Panel: No results for input(s): VITAMINB12, FOLATE, FERRITIN, TIBC, IRON, RETICCTPCT in the last 72 hours. Sepsis Labs: No results for input(s): PROCALCITON, LATICACIDVEN in the last 168 hours.  Recent Results (from the past 240 hour(s))  MRSA PCR Screening     Status: None   Collection Time: 05/20/16  6:30 PM  Result Value Ref Range Status   MRSA by PCR NEGATIVE NEGATIVE Final    Comment:        The GeneXpert MRSA Assay (FDA approved for NASAL specimens only), is one component of a comprehensive MRSA colonization surveillance program. It is not intended to diagnose MRSA infection nor to guide or monitor treatment for MRSA infections.          Radiology Studies: Dg Chest Portable 1 View  Result Date: 05/20/2016 CLINICAL DATA:  Shortness of breath since Friday. EXAM: PORTABLE CHEST 1 VIEW COMPARISON:  04/13/2016 FINDINGS: Elevated right hemidiaphragm. No confluent airspace opacities. Heart is upper limits normal in size. Tortuosity of the thoracic aorta. VP shunt is noted in the right chest wall. No acute bony abnormality. IMPRESSION: Stable elevation of the right hemidiaphragm.  No active disease. Electronically Signed   By: Charlett Nose M.D.   On: 05/20/2016 07:43        Scheduled Meds: . amLODipine  10 mg Oral Daily  . azithromycin  250 mg Oral Daily  . budesonide (PULMICORT) nebulizer solution  0.25 mg Nebulization BID  . enoxaparin (LOVENOX) injection  40 mg Subcutaneous Q24H  . guaiFENesin  600 mg Oral BID  . ipratropium-albuterol  3 mL  Nebulization Q6H  . methylPREDNISolone (SOLU-MEDROL) injection  60 mg Intravenous Q6H  . sodium chloride flush  3 mL Intravenous Q12H  . sodium chloride flush  3 mL Intravenous Q12H   Continuous Infusions:    LOS: 1 day    Time spent: 25 minutes     Erick Blinks, MD [facility] If 7PM-7AM, please contact night-coverage [email or web address] Password [email or web address] 05/22/2016, 7:30 AM

## 2016-05-23 DIAGNOSIS — J441 Chronic obstructive pulmonary disease with (acute) exacerbation: Principal | ICD-10-CM

## 2016-05-23 DIAGNOSIS — J9601 Acute respiratory failure with hypoxia: Secondary | ICD-10-CM

## 2016-05-23 MED ORDER — PREDNISONE 20 MG PO TABS
40.0000 mg | ORAL_TABLET | Freq: Every day | ORAL | Status: DC
  Administered 2016-05-24: 40 mg via ORAL
  Filled 2016-05-23: qty 2

## 2016-05-23 MED ORDER — GUAIFENESIN-DM 100-10 MG/5ML PO SYRP: 5.0000 mL | ORAL_SOLUTION | ORAL | Status: DC | PRN

## 2016-05-23 NOTE — Progress Notes (Signed)
O2 sats at rest 92%. O2 sats while ambulating on RA 92%.  Patient stated he "felt winded short of breath".

## 2016-05-23 NOTE — Progress Notes (Signed)
PROGRESS NOTE  Ventura BrunsJimmy XxxScales ZOX:[id number] DOB: 1956/09/07 DOA: 05/20/2016 PCP: No PCP Per Patient  Brief Narrative: [age] yom PMH COPD presented with shortness of breath. Admitted for COPD exacerbation and acute hypoxic respiratory failure.   Assessment/Plan: 1. Acute respiratory with hypoxia, secondary to COPD exacerbation. Resolved. 2. COPD exacerbation. Slowly improving. Chest x-ray no acute disease.  3. Pleuritic chest pain. Secondary to cough, COPD. EKG without acute changes. Troponins negative. No further evaluation suggested.  4. 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 Stable. 5. HTN. Stable.  6. DM. Blood sugars are stable.  7. Chronic diastolic CHF.    He is slowly improving.   Will transition to oral steroids.in the morning and continue antibiotics.   Anticipate discharge in 24 hours.   DVT prophylaxis: Lovenox  Code Status: Full  Family Communication: No family bedside. Officer bedside.  Disposition Plan: Anticipate discharge back to correctional facility in 24 hours   Brendia Sacksaniel Ayumi Wangerin, MD  [facility] Direct contact: [phone] --Via amion app OR  --[email or web address]; password [email or web address]  7PM-7AM contact night coverage as above 05/23/2016, 7:28 AM  LOS: 2 days   Consultants:  None   Procedures:  None   Antimicrobials:  Azithromycin 8/28 >>  HPI/Subjective: Does not feel well today. Breathing has not improved. He has been coughing up yellow phlegm.  Objective: Vitals:   05/22/16 1940 05/22/16 1950 05/22/16 2213 05/23/16 0623  BP:   (!) 156/69 (!) 154/88  Pulse:   77 72  Resp:   20 20  Temp:   97.8 F (36.6 C) 97.7 F (36.5 C)  TempSrc:   Oral Oral  SpO2: 90% 97% 93% 93%  Weight:      Height:       No intake or output data in the 24 hours ending 05/23/16 0728   Filed Weights   05/20/16 0722 05/20/16 1419  Weight: 104.3 kg (230 lb) 104.8 kg (231 lb 1.6 oz)    Exam:   Constitutional:  . Appears calm and comfortable Respiratory:    . Decreased breathing sounds, no wheezing.  Marland Kitchen. Respiratory effort normal. No retractions or accessory muscle use Cardiovascular:  . RRR, no m/r/g Telemetry sinus rhythm . No LE extremity edema    I have personally reviewed following labs and imaging studies:  No new data.   Scheduled Meds: . amLODipine  10 mg Oral Daily  . azithromycin  250 mg Oral Daily  . budesonide (PULMICORT) nebulizer solution  0.25 mg Nebulization BID  . enoxaparin (LOVENOX) injection  40 mg Subcutaneous Q24H  . guaiFENesin  600 mg Oral BID  . ipratropium-albuterol  3 mL Nebulization Q6H WA  . methylPREDNISolone (SOLU-MEDROL) injection  60 mg Intravenous Q12H  . sodium chloride flush  3 mL Intravenous Q12H  . sodium chloride flush  3 mL Intravenous Q12H   Continuous Infusions:   Active Problems:   HTN (hypertension)   Headache, chronic daily   S/P VP shunt   COPD exacerbation (HCC)   Acute respiratory failure (HCC)   Hypokalemia   Leukocytosis   LOS: 2 days   Time spent 25 minutes    By signing my name below, I, Cynda AcresHailei Fulton, attest that this documentation has been prepared under the direction and in the presence of Brendia Sacksaniel Leisha Trinkle, MD. Electronically signed: Cynda AcresHailei Fulton, Scribe. 05/23/16 11:28 AM   I personally performed the services described in this documentation. All medical record entries made by the scribe were at my direction. I have reviewed the chart and agree that  the record reflects my personal performance and is accurate and complete. Murray Hodgkins, MD

## 2016-05-23 NOTE — Care Management Note (Signed)
Case Management Note  Patient Details  Name: [name] MRN: [id number] Date of Birth: July 22, 1956  Subjective/Objective:                  Admitted with COPD exacerbation. [facility] DC anticipated later today. Pt ambulated on room air and does not meet criteria for supplemental oxygen. [place] notified of anticipated [place] and approval given for return to [facility]. Bedside RN to call report to [facility] nurse once DC orders received.   Action/Plan: Anticipate return to corrections facility today.   //Expected Discharge Date:    05/23/2016              Expected Discharge Plan:  Corrections Facility  In-House Referral:  NA  Discharge planning Services  CM Consult  Post Acute Care Choice:  NA Choice offered to:  NA  DME Arranged:    /DME Agency:     Albertine Patricia/HH Arranged:    HH Agency:     Status of Service:  Completed, signed off  If discussed at [employer] Meetings, dates discussed:    Additional Comments:  Malcolm MetroChildress, Young Mulvey Demske, RN 05/23/2016, 3:16 PM

## 2016-05-24 MED ORDER — GUAIFENESIN-DM 100-10 MG/5ML PO SYRP: 5.0000 mL | ORAL_SOLUTION | ORAL | Status: DC | PRN

## 2016-05-24 MED ORDER — PREDNISONE 10 MG PO TABS: ORAL_TABLET | ORAL | 0 refills | Status: DC

## 2016-05-24 MED ORDER — ACETAMINOPHEN 325 MG PO TABS: 650.0000 mg | ORAL_TABLET | Freq: Four times a day (QID) | ORAL | Status: DC | PRN

## 2016-05-24 NOTE — Discharge Summary (Signed)
Physician Discharge Summary  Ventura BrunsJimmy XxxScales [id number] DOB: 01-22-1956 DOA: 05/20/2016  PCP: No PCP Per Patient  Admit date: 05/20/2016 Discharge date: 05/24/2016  Recommendations for Outpatient Follow-up:  1. Follow-up resolution of COPD  2. Ongoing periodic health maintenance  Discharge Diagnoses:  1. Acute respiratory failure with hypoxia  2. COPD exacerbation  3. 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 HTN   Discharge Condition: Improved  Disposition: Correctional facility   Diet recommendation: Carb modified   Filed Weights   05/20/16 0722 05/20/16 1419  Weight: 104.3 kg (230 lb) 104.8 kg (231 lb 1.6 oz)    History of present illness:  [age] yom PMH COPD presented with shortness of breath. Admitted for COPD exacerbation and acute hypoxic respiratory failure.  Hospital Course:  Patient was treated with steroids, bronchodilators and oxygen with gradual clinical improvement. Hypoxia resolved and the patient is stable on room air. Clinical exam appears stable for discharge. Hospitalization was uncomplicated. Individual issues as below.   1. Acute respiratory with hypoxia, secondary to COPD exacerbation.Resolved. 2. COPD exacerbation. Appears resolved. Chest x-ray no acute disease.  3. Pleuritic chest pain. Secondary to cough, COPD. EKG withoutacute changes. Troponins negative. No further evaluation suggested. 4. Chronic HA s/p VP shuntfor hydrocephalus. 5. HTN. Stable.  Consultants:  None   Procedures:  None   Antimicrobials:  Azithromycin 8/27 >> 8/31  Discharge Instructions  Discharge Instructions    Diet - low sodium heart healthy    Complete by:  As directed   Discharge instructions    Complete by:  As directed   Call your physician or seek immediate medical attention for shortness of breath, wheezing, pain or worsening of condition.   Increase activity slowly    Complete by:  As directed       Medication List    STOP taking these  medications   azithromycin 250 MG tablet Commonly known as:  ZITHROMAX     TAKE these medications   acetaminophen 325 MG tablet Commonly known as:  TYLENOL Take 2 tablets (650 mg total) by mouth every 6 (six) hours as needed for mild pain (or Fever >/= 101).   albuterol 108 (90 Base) MCG/ACT inhaler Commonly known as:  PROVENTIL HFA;VENTOLIN HFA Inhale 2 puffs into the lungs every 6 (six) hours as needed for wheezing or shortness of breath. What changed:  Another medication with the same name was removed. Continue taking this medication, and follow the directions you see here.   amLODipine 10 MG tablet Commonly known as:  NORVASC Take 10 mg by mouth daily.   guaiFENesin-dextromethorphan 100-10 MG/5ML syrup Commonly known as:  ROBITUSSIN DM Take 5 mLs by mouth every 4 (four) hours as needed for cough.   predniSONE 10 MG tablet Commonly known as:  DELTASONE Take daily by mouth: 40 mg x3 days, then 20 mg x3 days, then 10 mg x3 days, then stop. What changed:  medication strength  how much to take  how to take this  when to take this  additional instructions      Allergies  Allergen Reactions  . Morphine Itching    The results of significant diagnostics from this hospitalization (including imaging, microbiology, ancillary and laboratory) are listed below for reference.    Significant Diagnostic Studies: Dg Chest Portable 1 View  Result Date: 05/20/2016 CLINICAL DATA:  Shortness of breath since Friday. EXAM: PORTABLE CHEST 1 VIEW COMPARISON:  04/13/2016 FINDINGS: Elevated right hemidiaphragm. No confluent airspace opacities. Heart is upper limits normal in  size. Tortuosity of the thoracic aorta. VP shunt is noted in the right chest wall. No acute bony abnormality. IMPRESSION: Stable elevation of the right hemidiaphragm.  No active disease. Electronically Signed   By: Charlett Nose M.D.   On: 05/20/2016 07:43    Microbiology: Recent Results (from the past 240 hour(s))    MRSA PCR Screening     Status: None   Collection Time: 05/20/16  6:30 PM  Result Value Ref Range Status   MRSA by PCR NEGATIVE NEGATIVE Final    Comment:        The GeneXpert MRSA Assay (FDA approved for NASAL specimens only), is one component of a comprehensive MRSA colonization surveillance program. It is not intended to diagnose MRSA infection nor to guide or monitor treatment for MRSA infections.      Labs: Basic Metabolic Panel:  Recent Labs Lab 05/20/16 0745 05/21/16 0546  NA 140 139  K 2.6* 3.5  CL 101 103  CO2 35* 28  GLUCOSE 102* 194*  BUN 15 20  CREATININE 1.02 1.05  CALCIUM 8.9 9.5  MG 2.0  --    Liver Function Tests:  Recent Labs Lab 05/20/16 0745  AST 22  ALT 9*  ALKPHOS 48  BILITOT 1.7*  PROT 8.0  ALBUMIN 4.5   CBC:  Recent Labs Lab 05/20/16 0745 05/21/16 0546  WBC 6.2 24.6*  NEUTROABS 2.5  --   HGB 14.5 14.2  HCT 43.7 42.5  MCV 94.0 94.2  PLT 242 274   Cardiac Enzymes:  Recent Labs Lab 05/20/16 0745 05/20/16 1040  TROPONINI <0.03 <0.03     Active Problems:   HTN (hypertension)   Headache, chronic daily   S/P VP shunt   COPD exacerbation (HCC)   Acute respiratory failure (HCC)   Hypokalemia   Leukocytosis   Time coordinating discharge: 35 minutes   Signed:  Brendia Sacks, MD [facility] 05/24/2016, 10:52 AM  By signing my name below, I, Cynda Acres, attest that this documentation has been prepared under the direction and in the presence of Brendia Sacks, MD. Electronically signed: Cynda Acres, Scribe. 05/24/16 9:44 AM   I personally performed the services described in this documentation. All medical record entries made by the scribe were at my direction. I have reviewed the chart and agree that the record reflects my personal performance and is accurate and complete. Brendia Sacks, MD

## 2016-05-24 NOTE — Progress Notes (Signed)
[facility], [employer].  [facility] today.

## 2016-05-24 NOTE — Progress Notes (Signed)
PROGRESS NOTE  Vernon BrunsJimmy Mendoza [id number] DOB: Jul 31, 1956 DOA: 05/20/2016 PCP: No PCP Per Patient  Brief Narrative: [age] yom PMH COPD presented with shortness of breath. Admitted for COPD exacerbation and acute hypoxic respiratory failure.  Assessment/Plan: 1. Acute respiratory with hypoxia, secondary to COPD exacerbation. Resolved. 2. COPD exacerbation. Appears resolved. Chest x-ray no acute disease.  3. Pleuritic chest pain. Secondary to cough, COPD. EKG without acute changes. Troponins negative. No further evaluation suggested.  4. 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  5. HTN. Stable.   COPD exacerbation resolved. He has completed a course of antibiotics. Steroid taper.  Return to correction facility.   Brendia Sacksaniel Goodrich, MD  [facility] Direct contact: [phone] --Via amion app OR  --[email or web address]; password [email or web address]  7PM-7AM contact night coverage as above 05/24/2016, 7:23 AM  LOS: 3 days   Consultants:  None   Procedures:  None   Antimicrobials:  Azithromycin 8/27 >> 8/31  HPI/Subjective: Doing well. Breathing and coughing has improved. Eating well. States he still has some congestion  Objective: Vitals:   05/23/16 1442 05/23/16 2000 05/23/16 2141 05/24/16 0529  BP:   (!) 144/71 (!) 156/96  Pulse:  73 71 81  Resp:  18 20 20   Temp:   98.4 F (36.9 C) 98.4 F (36.9 C)  TempSrc:   Oral Oral  SpO2: 92% 92% 92% 92%  Weight:      Height:        Intake/Output Summary (Last 24 hours) at 05/24/16 0723 Last data filed at 05/23/16 0900  Gross per 24 hour  Intake              480 ml  Output                0 ml  Net              480 ml     Filed Weights   05/20/16 0722 05/20/16 1419  Weight: 104.3 kg (230 lb) 104.8 kg (231 lb 1.6 oz)    Exam: Constitutional:  . Appears calm and comfortable Respiratory:  . CTA bilaterally, no w/r/r.  . Respiratory effort normal. No retractions or accessory muscle use Cardiovascular:  . RRR, no m/r/g  I  have personally reviewed following labs and imaging studies:  No new data.   Scheduled Meds: . amLODipine  10 mg Oral Daily  . azithromycin  250 mg Oral Daily  . budesonide (PULMICORT) nebulizer solution  0.25 mg Nebulization BID  . enoxaparin (LOVENOX) injection  40 mg Subcutaneous Q24H  . ipratropium-albuterol  3 mL Nebulization Q6H WA  . predniSONE  40 mg Oral Q breakfast  . sodium chloride flush  3 mL Intravenous Q12H  . sodium chloride flush  3 mL Intravenous Q12H   Continuous Infusions:   Active Problems:   HTN (hypertension)   Headache, chronic daily   S/P VP shunt   COPD exacerbation (HCC)   Acute respiratory failure (HCC)   Hypokalemia   Leukocytosis   LOS: 3 days    By signing my name below, I, Cynda AcresHailei Fulton, attest that this documentation has been prepared under the direction and in the presence of Brendia Sacksaniel Goodrich, MD. Electronically signed: Cynda AcresHailei Fulton, Scribe. 05/24/16 9:44AM   I personally performed the services described in this documentation. All medical record entries made by the scribe were at my direction. I have reviewed the chart and agree that the record reflects my personal performance and is accurate and complete. Brendia Sacksaniel Goodrich, MD

## 2016-07-02 ENCOUNTER — Inpatient Hospital Stay (HOSPITAL_COMMUNITY)
Admission: EM | Admit: 2016-07-02 | Discharge: 2016-07-12 | DRG: 190 | Attending: Internal Medicine | Admitting: Internal Medicine

## 2016-07-02 ENCOUNTER — Observation Stay (HOSPITAL_COMMUNITY)

## 2016-07-02 ENCOUNTER — Encounter (HOSPITAL_COMMUNITY): Payer: Self-pay | Admitting: Internal Medicine

## 2016-07-02 ENCOUNTER — Emergency Department (HOSPITAL_COMMUNITY)

## 2016-07-02 DIAGNOSIS — R739 Hyperglycemia, unspecified: Secondary | ICD-10-CM | POA: Diagnosis present

## 2016-07-02 DIAGNOSIS — I10 Essential (primary) hypertension: Secondary | ICD-10-CM | POA: Diagnosis present

## 2016-07-02 DIAGNOSIS — R140 Abdominal distension (gaseous): Secondary | ICD-10-CM

## 2016-07-02 DIAGNOSIS — D72829 Elevated white blood cell count, unspecified: Secondary | ICD-10-CM | POA: Diagnosis present

## 2016-07-02 DIAGNOSIS — E876 Hypokalemia: Secondary | ICD-10-CM | POA: Diagnosis present

## 2016-07-02 DIAGNOSIS — Z6835 Body mass index (BMI) 35.0-35.9, adult: Secondary | ICD-10-CM

## 2016-07-02 DIAGNOSIS — Z825 Family history of asthma and other chronic lower respiratory diseases: Secondary | ICD-10-CM

## 2016-07-02 DIAGNOSIS — Z885 Allergy status to narcotic agent status: Secondary | ICD-10-CM

## 2016-07-02 DIAGNOSIS — J441 Chronic obstructive pulmonary disease with (acute) exacerbation: Secondary | ICD-10-CM | POA: Diagnosis present

## 2016-07-02 DIAGNOSIS — R0902 Hypoxemia: Secondary | ICD-10-CM | POA: Diagnosis present

## 2016-07-02 DIAGNOSIS — G8929 Other chronic pain: Secondary | ICD-10-CM | POA: Diagnosis present

## 2016-07-02 DIAGNOSIS — J181 Lobar pneumonia, unspecified organism: Secondary | ICD-10-CM

## 2016-07-02 DIAGNOSIS — Z8249 Family history of ischemic heart disease and other diseases of the circulatory system: Secondary | ICD-10-CM

## 2016-07-02 DIAGNOSIS — J440 Chronic obstructive pulmonary disease with acute lower respiratory infection: Secondary | ICD-10-CM | POA: Diagnosis not present

## 2016-07-02 DIAGNOSIS — Z982 Presence of cerebrospinal fluid drainage device: Secondary | ICD-10-CM

## 2016-07-02 DIAGNOSIS — Z818 Family history of other mental and behavioral disorders: Secondary | ICD-10-CM

## 2016-07-02 DIAGNOSIS — Z79899 Other long term (current) drug therapy: Secondary | ICD-10-CM

## 2016-07-02 DIAGNOSIS — Z833 Family history of diabetes mellitus: Secondary | ICD-10-CM

## 2016-07-02 DIAGNOSIS — Z9889 Other specified postprocedural states: Secondary | ICD-10-CM

## 2016-07-02 DIAGNOSIS — R7303 Prediabetes: Secondary | ICD-10-CM | POA: Diagnosis present

## 2016-07-02 DIAGNOSIS — T380X5A Adverse effect of glucocorticoids and synthetic analogues, initial encounter: Secondary | ICD-10-CM | POA: Diagnosis present

## 2016-07-02 DIAGNOSIS — J154 Pneumonia due to other streptococci: Secondary | ICD-10-CM | POA: Diagnosis present

## 2016-07-02 DIAGNOSIS — J189 Pneumonia, unspecified organism: Secondary | ICD-10-CM

## 2016-07-02 DIAGNOSIS — I110 Hypertensive heart disease with heart failure: Secondary | ICD-10-CM | POA: Diagnosis present

## 2016-07-02 DIAGNOSIS — R0600 Dyspnea, unspecified: Secondary | ICD-10-CM

## 2016-07-02 DIAGNOSIS — I5032 Chronic diastolic (congestive) heart failure: Secondary | ICD-10-CM | POA: Diagnosis present

## 2016-07-02 DIAGNOSIS — E669 Obesity, unspecified: Secondary | ICD-10-CM | POA: Diagnosis present

## 2016-07-02 LAB — TROPONIN I: Troponin I: <0.03 ng/mL (ref <0.03)

## 2016-07-02 LAB — I-STAT TROPONIN, ED
TROPONIN I, POC: 0 ng/mL (ref 0.00–0.08)
Troponin i, poc: 0 ng/mL (ref 0.00–0.08)

## 2016-07-02 LAB — CBC
HCT: 40.3 % (ref 39.0–52.0)
Hemoglobin: 12.8 g/dL — ABNORMAL LOW (ref 13.0–17.0)
MCH: 30.8 pg (ref 26.0–34.0)
MCHC: 31.8 g/dL (ref 30.0–36.0)
MCV: 97.1 fL (ref 78.0–100.0)
PLATELETS: 237 10*3/uL (ref 150–400)
RBC: 4.15 MIL/uL — AB (ref 4.22–5.81)
RDW: 13.1 % (ref 11.5–15.5)
WBC: 13.2 10*3/uL — ABNORMAL HIGH (ref 4.0–10.5)

## 2016-07-02 LAB — BASIC METABOLIC PANEL
Anion gap: 7 (ref 5–15)
BUN: 12 mg/dL (ref 6–20)
CALCIUM: 8.6 mg/dL — AB (ref 8.9–10.3)
CO2: 27 mmol/L (ref 22–32)
CREATININE: 0.95 mg/dL (ref 0.61–1.24)
Chloride: 107 mmol/L (ref 101–111)
GFR calc non Af Amer: >60 mL/min (ref >60)
Glucose, Bld: 104 mg/dL — ABNORMAL HIGH (ref 65–99)
Potassium: 3.6 mmol/L (ref 3.5–5.1)
SODIUM: 141 mmol/L (ref 135–145)

## 2016-07-02 MED ORDER — IPRATROPIUM-ALBUTEROL 0.5-2.5 (3) MG/3ML IN SOLN
3.0000 mL | RESPIRATORY_TRACT | Status: DC
  Filled 2016-07-02: qty 3

## 2016-07-02 MED ORDER — AZITHROMYCIN 500 MG IV SOLR
500.0000 mg | Freq: Once | INTRAVENOUS | Status: AC
  Administered 2016-07-02: 500 mg via INTRAVENOUS
  Filled 2016-07-02: qty 500

## 2016-07-02 MED ORDER — KETOROLAC TROMETHAMINE 30 MG/ML IJ SOLN
30.0000 mg | Freq: Once | INTRAMUSCULAR | Status: AC
  Administered 2016-07-02: 30 mg via INTRAVENOUS
  Filled 2016-07-02: qty 1

## 2016-07-02 MED ORDER — ALBUTEROL SULFATE (2.5 MG/3ML) 0.083% IN NEBU: 2.5000 mg | INHALATION_SOLUTION | RESPIRATORY_TRACT | Status: DC | PRN

## 2016-07-02 MED ORDER — MOMETASONE FURO-FORMOTEROL FUM 100-5 MCG/ACT IN AERO
2.0000 | INHALATION_SPRAY | Freq: Two times a day (BID) | RESPIRATORY_TRACT | Status: DC
  Administered 2016-07-03 – 2016-07-12 (×17): 2 via RESPIRATORY_TRACT
  Filled 2016-07-02 (×2): qty 8.8

## 2016-07-02 MED ORDER — DEXTROSE 5 % IV SOLN: 1.0000 g | INTRAVENOUS | Status: DC

## 2016-07-02 MED ORDER — GUAIFENESIN ER 600 MG PO TB12
600.0000 mg | ORAL_TABLET | Freq: Two times a day (BID) | ORAL | Status: DC
  Administered 2016-07-02 – 2016-07-12 (×20): 600 mg via ORAL
  Filled 2016-07-02 (×20): qty 1

## 2016-07-02 MED ORDER — HYDROCHLOROTHIAZIDE 25 MG PO TABS
25.0000 mg | ORAL_TABLET | Freq: Every day | ORAL | Status: DC
  Administered 2016-07-03 – 2016-07-12 (×10): 25 mg via ORAL
  Filled 2016-07-02 (×10): qty 1

## 2016-07-02 MED ORDER — METHYLPREDNISOLONE SODIUM SUCC 125 MG IJ SOLR
60.0000 mg | Freq: Four times a day (QID) | INTRAMUSCULAR | Status: DC
  Administered 2016-07-02 – 2016-07-05 (×10): 60 mg via INTRAVENOUS
  Filled 2016-07-02 (×10): qty 2

## 2016-07-02 MED ORDER — ACETAMINOPHEN 325 MG PO TABS
650.0000 mg | ORAL_TABLET | Freq: Once | ORAL | Status: AC
  Administered 2016-07-02: 650 mg via ORAL
  Filled 2016-07-02: qty 2

## 2016-07-02 MED ORDER — IPRATROPIUM-ALBUTEROL 0.5-2.5 (3) MG/3ML IN SOLN
3.0000 mL | Freq: Three times a day (TID) | RESPIRATORY_TRACT | Status: DC
  Administered 2016-07-03 – 2016-07-04 (×4): 3 mL via RESPIRATORY_TRACT
  Filled 2016-07-02 (×4): qty 3

## 2016-07-02 MED ORDER — AZITHROMYCIN 500 MG PO TABS
500.0000 mg | ORAL_TABLET | ORAL | Status: DC
  Administered 2016-07-03 – 2016-07-04 (×2): 500 mg via ORAL
  Filled 2016-07-02 (×2): qty 1

## 2016-07-02 MED ORDER — METHYLPREDNISOLONE SODIUM SUCC 125 MG IJ SOLR
125.0000 mg | Freq: Once | INTRAMUSCULAR | Status: AC
  Administered 2016-07-02: 125 mg via INTRAVENOUS
  Filled 2016-07-02: qty 2

## 2016-07-02 MED ORDER — ALBUTEROL SULFATE (2.5 MG/3ML) 0.083% IN NEBU
5.0000 mg | INHALATION_SOLUTION | Freq: Once | RESPIRATORY_TRACT | Status: AC
  Administered 2016-07-02: 5 mg via RESPIRATORY_TRACT
  Filled 2016-07-02: qty 6

## 2016-07-02 MED ORDER — AMLODIPINE BESYLATE 10 MG PO TABS
10.0000 mg | ORAL_TABLET | Freq: Every day | ORAL | Status: DC
  Administered 2016-07-03 – 2016-07-12 (×10): 10 mg via ORAL
  Filled 2016-07-02 (×8): qty 1
  Filled 2016-07-02: qty 2
  Filled 2016-07-02: qty 1

## 2016-07-02 MED ORDER — ALBUTEROL (5 MG/ML) CONTINUOUS INHALATION SOLN
5.0000 mg/h | INHALATION_SOLUTION | Freq: Once | RESPIRATORY_TRACT | Status: AC
  Administered 2016-07-02: 5 mg/h via RESPIRATORY_TRACT
  Filled 2016-07-02: qty 20

## 2016-07-02 MED ORDER — DEXTROSE 5 % IV SOLN
1.0000 g | Freq: Once | INTRAVENOUS | Status: AC
  Administered 2016-07-02: 1 g via INTRAVENOUS
  Filled 2016-07-02: qty 10

## 2016-07-02 MED ORDER — ACETAMINOPHEN 325 MG PO TABS
650.0000 mg | ORAL_TABLET | Freq: Four times a day (QID) | ORAL | Status: DC | PRN
  Administered 2016-07-03: 650 mg via ORAL
  Filled 2016-07-02: qty 2

## 2016-07-02 MED ORDER — DEXTROSE 5 % IV SOLN
2.0000 g | INTRAVENOUS | Status: DC
  Administered 2016-07-03 – 2016-07-04 (×2): 2 g via INTRAVENOUS
  Filled 2016-07-02 (×3): qty 2

## 2016-07-02 MED ORDER — ONDANSETRON HCL 4 MG/2ML IJ SOLN: 4.0000 mg | Freq: Four times a day (QID) | INTRAMUSCULAR | Status: DC | PRN

## 2016-07-02 MED ORDER — IPRATROPIUM-ALBUTEROL 0.5-2.5 (3) MG/3ML IN SOLN
3.0000 mL | Freq: Four times a day (QID) | RESPIRATORY_TRACT | Status: DC
  Administered 2016-07-02 (×2): 3 mL via RESPIRATORY_TRACT
  Filled 2016-07-02 (×2): qty 3

## 2016-07-02 NOTE — ED Provider Notes (Signed)
[facility]-EMERGENCY DEPT Provider Note   CSN: [id number] Arrival date & time: 07/02/16  1428  History   Chief Complaint Chief Complaint  Patient presents with  . Chest Pain  . Shortness of Breath    HPI [name] is a 60 y.o. male.  HPI  60 y.o. male with a hx of HTN, HLD, DM, Diastolic CHF, Asthma, COPD, presents to the Emergency Department today complaining of shortness of breath x 1 week as well as CP x 2 days. Pt presents from correctional facility via EMS. Given breathing treatment via EMS with moderate improvement. ASA and NTG x3 given with no relief. 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 Notes pain central with no radiation. Rates pain 7/10. Pt notes that he has felt sick x 1 week with URI symptoms. No fevers. No N/V. Does endorse occasional diarrhea. No hx ACS. fatehr in 6960s with heart attack. No other symptoms noted.    Past Medical History:  Diagnosis Date  . Abdominal hernia   . Asthma   . BPH (benign prostatic hyperplasia)   . Chronic pain   . COPD (chronic obstructive pulmonary disease) (HCC)   . Diabetes mellitus   . Diastolic CHF (HCC)   . Headache(784.0)   . Hyperlipidemia   . Hypertension   . Substance abuse    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 Cocaine    Patient Active Problem List   Diagnosis Date Noted  . Leukocytosis 05/21/2016  . COPD exacerbation (HCC) 05/20/2016  . Acute respiratory failure (HCC) 05/20/2016  . Hypokalemia 05/20/2016  . Obesity 04/28/2013  . COPD with exacerbation (HCC) 08/05/2012  . Rotator cuff tear, left 05/22/2012  . Rotator cuff syndrome of left shoulder 05/22/2012  . Chronic pain 02/04/2012  . Environmental allergies 02/04/2012  . COPD (chronic obstructive pulmonary disease) (HCC) 12/05/2011  . Hyperlipidemia 11/20/2011  . Headache, chronic daily 11/20/2011  . 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 Seizure prophylaxis 11/20/2011  . Substance abuse 11/20/2011  . Diabetes mellitus type II, controlled  (HCC) 10/26/2009  . HTN (hypertension) 10/26/2009  . CHF 10/26/2009    Past Surgical History:  Procedure Laterality Date  . BRAIN SURGERY    . COLON SURGERY  06/2011   Diverticulitis Complicated  . MANDIBLE SURGERY    . VENTRICULOPERITONEAL SHUNT         Home Medications    Prior to Admission medications   Medication Sig Start Date End Date Taking? Authorizing Provider  acetaminophen (TYLENOL) 325 MG tablet Take 2 tablets (650 mg total) by mouth every 6 (six) hours as needed for mild pain (or Fever >/= 101). 05/24/16   Standley Brookinganiel P Goodrich, MD  albuterol (PROVENTIL HFA;VENTOLIN HFA) 108 (90 Base) MCG/ACT inhaler Inhale 2 puffs into the lungs every 6 (six) hours as needed for wheezing or shortness of breath.    Historical Provider, MD  amLODipine (NORVASC) 10 MG tablet Take 10 mg by mouth daily.    Historical Provider, MD  guaiFENesin-dextromethorphan (ROBITUSSIN DM) 100-10 MG/5ML syrup Take 5 mLs by mouth every 4 (four) hours as needed for cough. 05/24/16   Standley Brookinganiel P Goodrich, MD  predniSONE (DELTASONE) 10 MG tablet Take daily by mouth: 40 mg x3 days, then 20 mg x3 days, then 10 mg x3 days, then stop. 05/24/16   Standley Brookinganiel P Goodrich, MD    Family History Family History  Problem Relation Age of Onset  . Hypertension Mother   . Hyperlipidemia Mother   . Depression Mother   .  Hypertension Father   . Hyperlipidemia Father   . Diabetes Father   . Arthritis    . Asthma      Social History Social History  Substance Use Topics  . Smoking status: Never Smoker  . Smokeless tobacco: Never Used  . Alcohol use No     Allergies   Morphine   Review of Systems Review of Systems ROS reviewed and all are negative for acute change except as noted in the HPI.  Physical Exam Updated Vital Signs BP 150/91 (BP Location: Left Arm)   Pulse 76   Temp 98 F (36.7 C)   Resp 15   SpO2 96%   Physical Exam  Constitutional: He is oriented to person, place, and time. Vital signs are normal.  He appears well-developed and well-nourished.  HENT:  Head: Normocephalic and atraumatic.  Right Ear: Hearing normal.  Left Ear: Hearing normal.  Eyes: Conjunctivae and EOM are normal. Pupils are equal, round, and reactive to light.  Neck: Normal range of motion. Neck supple.  Cardiovascular: Normal rate, regular rhythm, normal heart sounds and intact distal pulses.   Pulmonary/Chest: Effort normal. No accessory muscle usage. No tachypnea. No respiratory distress. He has wheezes in the right upper field, the right lower field, the left upper field and the left lower field. He has rhonchi in the right upper field, the right lower field, the left upper field and the left lower field.  Musculoskeletal: Normal range of motion.  Neurological: He is alert and oriented to person, place, and time.  Skin: Skin is warm and dry.  Psychiatric: He has a normal mood and affect. His speech is normal and behavior is normal. Thought content normal.  Nursing note and vitals reviewed.  ED Treatments / Results  Labs (all labs ordered are listed, but only abnormal results are displayed) Labs Reviewed  BASIC METABOLIC PANEL - Abnormal; Notable for the following:       Result Value   Glucose, Bld 104 (*)    Calcium 8.6 (*)    All other components within normal limits  CBC - Abnormal; Notable for the following:    WBC 13.2 (*)    RBC 4.15 (*)    Hemoglobin 12.8 (*)    All other components within normal limits  I-STAT TROPOININ, ED  I-STAT TROPOININ, ED   EKG  EKG Interpretation None      Radiology Dg Chest Portable 1 View  Result Date: 07/02/2016 CLINICAL DATA:  Shortness of breath EXAM: PORTABLE CHEST 1 VIEW COMPARISON:  05/20/2016 FINDINGS: AP portable upright view chest. Right-sided shunt tubing is identified. Right hemidiaphragm is elevated as before. Hazy opacity in the right base could reflect atelectasis or a mild infiltrate. Stable cardiomediastinal silhouette with tortuous aorta. No  pneumothorax. IMPRESSION: 1. Elevated right hemidiaphragm. Hazy atelectasis versus mild infiltrate right base. 2. Otherwise no significant interval changes Electronically Signed   By: Jasmine Pang M.D.   On: 07/02/2016 15:10   Procedures Procedures (including critical care time)  Medications Ordered in ED Medications  ipratropium-albuterol (DUONEB) 0.5-2.5 (3) MG/3ML nebulizer solution 3 mL (3 mLs Nebulization Given 07/02/16 1457)  methylPREDNISolone sodium succinate (SOLU-MEDROL) 125 mg/2 mL injection 125 mg (125 mg Intravenous Given 07/02/16 1457)   Initial Impression / Assessment and Plan / ED Course  I have reviewed the triage vital signs and the nursing notes.  Pertinent labs & imaging results that were available during my care of the patient were reviewed by me and considered in my medical  decision making (see chart for details).  Clinical Course   Final Clinical Impressions(s) / ED Diagnoses  I have reviewed and evaluated the relevant laboratory values I have reviewed and evaluated the relevant imaging studies.  I have interpreted the relevant EKG. I have reviewed the relevant previous healthcare records. I have reviewed EMS Documentation. I obtained HPI from historian. Patient discussed with supervising physician  ED Course:  Assessment: Pt is a 60yM presents with CP x2 days, SOB x 1 week. Risk Factors HTN, HLD, DM. Given Duoneb, solumedrol in ED for likely COPD exacerbation from possible PNA. Chest pain is not likely of cardiac etiology d/t presentation, perc negative, VSS, no tracheal deviation, no JVD or new murmur, RRR, breath sounds with diffuse wheezing and rhonchi, EKG without acute abnormalities, negative troponin x2, and CXR shows possible PNA. WBC 13.2. O2 Saturations 96% on RA, but dropped to ~90% on ambulation. Given Rocephin and Azithro. Unable to improve wheezing and O2 saturations with nebulizer treatments. Will admit for COPD exacerbation with possible PNA.    Disposition/Plan:  Admit to Medicine Pt acknowledges and agrees with plan  Supervising Physician Loren Racer, MD   Final diagnoses:  Community acquired pneumonia of right lower lobe of lung [facility])  COPD exacerbation [facility])    New Prescriptions New Prescriptions   No medications on file     Audry Pili, PA-C 07/02/16 1904    Loren Racer, MD 07/07/16 1303

## 2016-07-02 NOTE — ED Notes (Signed)
Pt adamant about not using the bedside commode or bedpan. Pt wanted to walk to the bathroom. Informed Brooke - Charity fundraiserN.

## 2016-07-02 NOTE — H&P (Signed)
History and Physical    [name] UEA:[id number] DOB: 1956/03/28 DOA: 07/02/2016  PCP: The patient is currently cared for by physicians through his correctional facility.  Patient coming from: Correctional facility  Chief Complaint: Chest pain and shortness of breath  HPI: Vernon MarshallJimmy Mendoza is a 60 y.o. gentleman with a history of chronic diastolic heart failure, HTN, HLD, diet controlled "borderline" DM, COPD, and asthma who presents to the ED for evaluation of progressive SOB x one week, chest tightness with increase wheezing and cough productive of yellow sputum x three days.  He is an inmate, and he reports that he has had sick contacts at his correctional facility.  No documented fever but he has had chills and sweats.  No nausea or vomiting.  No hemoptysis.  He has a had a sore throat and post-nasal drip.  He does not wear oxygen at baseline.  ED Course: ED attending concerned for diffuse wheezing on exam, even after two nebulizer treatments.  He has relative hypoxia with O2 sats near 90% at rest.  Chest xray concerning for elevated right hemidiaphragm with atelectasis vs mild infiltrate in the RLL.  EKG shows NSR without acute ST segment changes.  He does not appear septic.  He has received IV solumedrol as well as Rocephin and azithromycin.  Hospitalist asked to admit.  Review of Systems: As per HPI otherwise 10 point review of systems negative.    Past Medical History:  Diagnosis Date  . Abdominal hernia   . Asthma   . BPH (benign prostatic hyperplasia)   . Chronic pain   . COPD (chronic obstructive pulmonary disease) (HCC)   . Diabetes mellitus   . Diastolic CHF (HCC)   . Headache(784.0)   . Hyperlipidemia   . Hypertension   . Substance abuse    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 Cocaine    Past Surgical History:  Procedure Laterality Date  . BRAIN SURGERY    . COLON SURGERY  06/2011   Diverticulitis Complicated  . MANDIBLE SURGERY    . VENTRICULOPERITONEAL SHUNT      SOCIAL  HISTORY: No tobacco or EtOH use.  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  He has two adult sons.  He is married.  His wife Marylu LundJanet is his next of kin/emergency contact.  Allergies  Allergen Reactions  . Morphine Itching    Family History  Problem Relation Age of Onset  . Hypertension Mother   . Hyperlipidemia Mother   . Depression Mother   . Hypertension Father   . Hyperlipidemia Father   . Diabetes Father   . Arthritis    . Asthma       Prior to Admission medications   Medication Sig Start Date End Date Taking? Authorizing Provider  albuterol (PROVENTIL HFA;VENTOLIN HFA) 108 (90 Base) MCG/ACT inhaler Inhale 2 puffs into the lungs 4 (four) times daily as needed for wheezing or shortness of breath.    Yes Historical Provider, MD  amLODipine (NORVASC) 10 MG tablet Take 10 mg by mouth daily.   Yes Historical Provider, MD  budesonide-formoterol (SYMBICORT) 80-4.5 MCG/ACT inhaler Inhale 2 puffs into the lungs 2 (two) times daily.   Yes Historical Provider, MD  hydrochlorothiazide (HYDRODIURIL) 25 MG tablet Take 25 mg by mouth daily.   Yes Historical Provider, MD  ibuprofen (ADVIL,MOTRIN) 800 MG tablet Take 800 mg by mouth every 8 (eight) hours as needed for headache (pain).   Yes Historical Provider, MD  tiotropium (SPIRIVA) 18  MCG inhalation capsule Place 18 mcg into inhaler and inhale daily.   Yes Historical Provider, MD    Physical Exam: Vitals:   07/02/16 1800 07/02/16 1810 07/02/16 1830 07/02/16 1900  BP: 135/79  141/76 146/80  Pulse: 81  86 92  Resp: 16  19 23   Temp:      SpO2: 93% 93% 97% 91%      Constitutional: NAD, calm but somewhat ill appearing Vitals:   07/02/16 1800 07/02/16 1810 07/02/16 1830 07/02/16 1900  BP: 135/79  141/76 146/80  Pulse: 81  86 92  Resp: 16  19 23   Temp:      SpO2: 93% 93% 97% 91%   Eyes: PERRL, lids and conjunctivae normal ENMT: Mucous membranes are moist. Posterior pharynx clear of any exudate or lesions.  Normal dentition.  Neck: normal appearance, supple Respiratory: Diffuse wheezing bilaterally with diminished breath sounds at the right lung base.  Normal respiratory effort. No accessory muscle use.  Cardiovascular: Normal rate, regular rhythm, no murmurs / rubs / gallops. No extremity edema. 2+ pedal pulses. GI: abdomen is protuberant, distended, but nontender.  Bowel sounds are hypoactive.   Musculoskeletal:  No joint deformity in upper and lower extremities. Good ROM, no contractures. Normal muscle tone.  Skin: no rashes, warm and dry Neurologic: No focal deficits. Psychiatric: Normal judgment and insight. Alert and oriented x 3. Normal mood.     Labs on Admission: I have personally reviewed following labs and imaging studies  CBC:  Recent Labs Lab 07/02/16 1440  WBC 13.2*  HGB 12.8*  HCT 40.3  MCV 97.1  PLT 237   Basic Metabolic Panel:  Recent Labs Lab 07/02/16 1440  NA 141  K 3.6  CL 107  CO2 27  GLUCOSE 104*  BUN 12  CREATININE 0.95  CALCIUM 8.6*   GFR: CrCl cannot be calculated (Unknown ideal weight.).  Urine analysis: Pending  Radiological Exams on Admission: Dg Abd 1 View  Result Date: 07/03/2016 CLINICAL DATA:  Acute onset of generalized abdominal distention and abdominal pain. Initial encounter. EXAM: ABDOMEN - 1 VIEW COMPARISON:  Abdominal radiograph performed 07/30/2011 FINDINGS: The visualized bowel gas pattern is unremarkable. Scattered air and stool filled loops of colon are seen; no abnormal dilatation of small bowel loops is seen to suggest small bowel obstruction. A single distended loop of small bowel is noted, nonspecific in appearance. No free intra-abdominal air is identified, though evaluation for free air is limited on supine views. The visualized osseous structures are within normal limits; the sacroiliac joints are unremarkable in appearance. A ventriculoperitoneal shunt is partially imaged, ending overlying the right hemipelvis.  IMPRESSION: Unremarkable bowel gas pattern; no free intra-abdominal air seen. Small to moderate amount of stool noted in the colon. Electronically Signed   By: Roanna Raider M.D.   On: 07/03/2016 02:27   Dg Chest Portable 1 View  Result Date: 07/02/2016 CLINICAL DATA:  Shortness of breath EXAM: PORTABLE CHEST 1 VIEW COMPARISON:  05/20/2016 FINDINGS: AP portable upright view chest. Right-sided shunt tubing is identified. Right hemidiaphragm is elevated as before. Hazy opacity in the right base could reflect atelectasis or a mild infiltrate. Stable cardiomediastinal silhouette with tortuous aorta. No pneumothorax. IMPRESSION: 1. Elevated right hemidiaphragm. Hazy atelectasis versus mild infiltrate right base. 2. Otherwise no significant interval changes Electronically Signed   By: Jasmine Pang M.D.   On: 07/02/2016 15:10    EKG: Independently reviewed. Noted above.  Assessment/Plan Principal Problem:   COPD exacerbation (HCC) Active Problems:  HTN (hypertension)   S/P VP shunt   Leukocytosis   CAP (community acquired pneumonia)    CAP causing COPD exacerbation IV Rocephin and PO azithromycin per pneumonia protocol Blood cultures Urine legionella and streptococcal antigens Check HIV Repeat PA/lateral chest xray in the morning Wean oxygen as tolerated (thepatient is not on oxygen at home) Solumedrol 60mg  IV q6h Scheduled duonebs q4h while awake (HOLD Spiriva) Continue Symbicort (or formulary equivalent)  Atypical chest pain which I believe is related to his COPD exacerbation --Telemetry monitoring --Serial troponin  HTN --Continue home doses of HCTZ and amlodipine  Abdominal distention --KUB shows unremarkable gas pattern    DVT prophylaxis: SCDs Code Status: FULL Family Communication: Correctional officers present at bedside at the time of admission in the ED. Disposition Plan: To be determined. Consults called: NONE Admission status: Observation, telemetry   TIME  SPENT: 60 minutes   Jerene Bears MD [facility] Pager [phone]  If 7PM-7AM, please contact night-coverage [email or web address] Password [email or web address]  07/02/2016, 7:54 PM

## 2016-07-02 NOTE — ED Notes (Signed)
Gave pt cup of ice cream, per Nehemiah SettleBrooke - Charity fundraiserN.

## 2016-07-02 NOTE — ED Triage Notes (Signed)
Per EMS - pt c/o shortness of breath x 1 week, CP x 2 days. Pt reports productive cough w/ yellow sputum. Hx HTN, COPD, asthma. Wheezing in all lung fields. Given 324mg  aspirin, 3 nitro w/ no relief. Given 2 albuterol tx w/ some relief.

## 2016-07-02 NOTE — Progress Notes (Signed)
MEDICATION RELATED CONSULT NOTE - INITIAL   Pharmacy Consult for ABX renal dose adjustment   Allergies  Allergen Reactions  . Morphine Itching    Vital Signs: Temp: 98 F (36.7 C) (10/09 1436) BP: 145/81 (10/09 1945) Pulse Rate: 90 (10/09 1945) Intake/Output from previous day: No intake/output data recorded. Intake/Output from this shift: No intake/output data recorded.  Labs:  Recent Labs  07/02/16 1440  WBC 13.2*  HGB 12.8*  HCT 40.3  PLT 237  CREATININE 0.95   CrCl cannot be calculated (Unknown ideal weight.).   Microbiology: No results found for this or any previous visit (from the past 720 hour(s)).  Medical History: Past Medical History:  Diagnosis Date  . Abdominal hernia   . Asthma   . BPH (benign prostatic hyperplasia)   . Chronic pain   . COPD (chronic obstructive pulmonary disease) (HCC)   . Diabetes mellitus   . Diastolic CHF (HCC)   . Headache(784.0)   . Hyperlipidemia   . Hypertension   . 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 No renal dose adjustments are required for the current antibiotic regimen - current doses are appropriate in regard to renal function however, will increase ceftriaxone to 2g q24h for possible PNA. Pharmacy will sign off. Please re-consult for any further needs.   Sherle Poeob Vedant Shehadeh, PharmD Clinical Pharmacist 9:16 PM, 07/02/2016

## 2016-07-02 NOTE — Discharge Instructions (Signed)
Please read and follow all provided instructions.  Your diagnoses today include:  1. Community acquired pneumonia of right lower lobe of lung (HCC)   2. COPD exacerbation (HCC)    Tests performed today include: Blood counts and electrolytes Chest x-ray -- shows pneumonia Vital signs. See below for your results today.   Medications prescribed:   Take any prescribed medications only as directed.  Home care instructions:  Follow any educational materials contained in this packet.  Take the complete course of antibiotics that you were prescribed.   BE VERY CAREFUL not to take multiple medicines containing Tylenol (also called acetaminophen). Doing so can lead to an overdose which can damage your liver and cause liver failure and possibly death.   Follow-up instructions: Please follow-up with your primary care provider in the next 3 days for further evaluation of your symptoms and to ensure resolution of your infection.   Return instructions:  Please return to the Emergency Department if you experience worsening symptoms.  Return immediately with worsening breathing, worsening shortness of breath, or if you feel it is taking you more effort to breathe.  Please return if you have any other emergent concerns.  Additional Information:  Your vital signs today were: BP 142/92 (BP Location: Left Arm)    Pulse 82    Temp 98 F (36.7 C)    Resp 24    SpO2 96%  If your blood pressure (BP) was elevated above 135/85 this visit, please have this repeated by your doctor within one month. --------------

## 2016-07-03 ENCOUNTER — Encounter (HOSPITAL_COMMUNITY): Payer: Self-pay | Admitting: Nurse Practitioner

## 2016-07-03 ENCOUNTER — Observation Stay (HOSPITAL_COMMUNITY)

## 2016-07-03 DIAGNOSIS — Z6835 Body mass index (BMI) 35.0-35.9, adult: Secondary | ICD-10-CM | POA: Diagnosis not present

## 2016-07-03 DIAGNOSIS — Z833 Family history of diabetes mellitus: Secondary | ICD-10-CM | POA: Diagnosis not present

## 2016-07-03 DIAGNOSIS — E876 Hypokalemia: Secondary | ICD-10-CM | POA: Diagnosis present

## 2016-07-03 DIAGNOSIS — R0600 Dyspnea, unspecified: Secondary | ICD-10-CM | POA: Diagnosis not present

## 2016-07-03 DIAGNOSIS — A491 Streptococcal infection, unspecified site: Secondary | ICD-10-CM | POA: Diagnosis not present

## 2016-07-03 DIAGNOSIS — J440 Chronic obstructive pulmonary disease with acute lower respiratory infection: Secondary | ICD-10-CM | POA: Diagnosis present

## 2016-07-03 DIAGNOSIS — Z9889 Other specified postprocedural states: Secondary | ICD-10-CM | POA: Diagnosis not present

## 2016-07-03 DIAGNOSIS — I5032 Chronic diastolic (congestive) heart failure: Secondary | ICD-10-CM | POA: Diagnosis present

## 2016-07-03 DIAGNOSIS — D72828 Other elevated white blood cell count: Secondary | ICD-10-CM | POA: Diagnosis not present

## 2016-07-03 DIAGNOSIS — J181 Lobar pneumonia, unspecified organism: Secondary | ICD-10-CM | POA: Diagnosis not present

## 2016-07-03 DIAGNOSIS — J154 Pneumonia due to other streptococci: Secondary | ICD-10-CM | POA: Diagnosis present

## 2016-07-03 DIAGNOSIS — I110 Hypertensive heart disease with heart failure: Secondary | ICD-10-CM | POA: Diagnosis present

## 2016-07-03 DIAGNOSIS — J441 Chronic obstructive pulmonary disease with (acute) exacerbation: Secondary | ICD-10-CM | POA: Diagnosis present

## 2016-07-03 DIAGNOSIS — G8929 Other chronic pain: Secondary | ICD-10-CM | POA: Diagnosis present

## 2016-07-03 DIAGNOSIS — R0902 Hypoxemia: Secondary | ICD-10-CM | POA: Diagnosis present

## 2016-07-03 DIAGNOSIS — T380X5A Adverse effect of glucocorticoids and synthetic analogues, initial encounter: Secondary | ICD-10-CM | POA: Diagnosis present

## 2016-07-03 DIAGNOSIS — Z818 Family history of other mental and behavioral disorders: Secondary | ICD-10-CM | POA: Diagnosis not present

## 2016-07-03 DIAGNOSIS — Z885 Allergy status to narcotic agent status: Secondary | ICD-10-CM | POA: Diagnosis not present

## 2016-07-03 DIAGNOSIS — E669 Obesity, unspecified: Secondary | ICD-10-CM | POA: Diagnosis present

## 2016-07-03 DIAGNOSIS — D72829 Elevated white blood cell count, unspecified: Secondary | ICD-10-CM | POA: Diagnosis not present

## 2016-07-03 DIAGNOSIS — R140 Abdominal distension (gaseous): Secondary | ICD-10-CM | POA: Diagnosis present

## 2016-07-03 DIAGNOSIS — Z982 Presence of cerebrospinal fluid drainage device: Secondary | ICD-10-CM | POA: Diagnosis not present

## 2016-07-03 DIAGNOSIS — R739 Hyperglycemia, unspecified: Secondary | ICD-10-CM | POA: Diagnosis present

## 2016-07-03 DIAGNOSIS — Z825 Family history of asthma and other chronic lower respiratory diseases: Secondary | ICD-10-CM | POA: Diagnosis not present

## 2016-07-03 DIAGNOSIS — Z79899 Other long term (current) drug therapy: Secondary | ICD-10-CM | POA: Diagnosis not present

## 2016-07-03 DIAGNOSIS — I10 Essential (primary) hypertension: Secondary | ICD-10-CM | POA: Diagnosis not present

## 2016-07-03 DIAGNOSIS — Z8249 Family history of ischemic heart disease and other diseases of the circulatory system: Secondary | ICD-10-CM | POA: Diagnosis not present

## 2016-07-03 DIAGNOSIS — R7303 Prediabetes: Secondary | ICD-10-CM | POA: Diagnosis present

## 2016-07-03 LAB — CBC
HEMATOCRIT: 40.3 % (ref 39.0–52.0)
HEMOGLOBIN: 12.9 g/dL — AB (ref 13.0–17.0)
MCH: 31.1 pg (ref 26.0–34.0)
MCHC: 32 g/dL (ref 30.0–36.0)
MCV: 97.1 fL (ref 78.0–100.0)
Platelets: 221 10*3/uL (ref 150–400)
RBC: 4.15 MIL/uL — ABNORMAL LOW (ref 4.22–5.81)
RDW: 13.2 % (ref 11.5–15.5)
WBC: 17.2 10*3/uL — AB (ref 4.0–10.5)

## 2016-07-03 LAB — TROPONIN I

## 2016-07-03 LAB — COMPREHENSIVE METABOLIC PANEL
ALT: 10 U/L — ABNORMAL LOW (ref 17–63)
AST: 18 U/L (ref 15–41)
Albumin: 3.6 g/dL (ref 3.5–5.0)
Alkaline Phosphatase: 48 U/L (ref 38–126)
Anion gap: 10 (ref 5–15)
BUN: 12 mg/dL (ref 6–20)
CHLORIDE: 104 mmol/L (ref 101–111)
CO2: 25 mmol/L (ref 22–32)
Calcium: 9 mg/dL (ref 8.9–10.3)
Creatinine, Ser: 1.05 mg/dL (ref 0.61–1.24)
Glucose, Bld: 177 mg/dL — ABNORMAL HIGH (ref 65–99)
POTASSIUM: 3.5 mmol/L (ref 3.5–5.1)
SODIUM: 139 mmol/L (ref 135–145)
Total Bilirubin: 0.9 mg/dL (ref 0.3–1.2)
Total Protein: 7.1 g/dL (ref 6.5–8.1)

## 2016-07-03 LAB — GLUCOSE, CAPILLARY: GLUCOSE-CAPILLARY: 147 mg/dL — AB (ref 65–99)

## 2016-07-03 LAB — STREP PNEUMONIAE URINARY ANTIGEN: STREP PNEUMO URINARY ANTIGEN: POSITIVE — AB

## 2016-07-03 LAB — MRSA PCR SCREENING: MRSA BY PCR: NEGATIVE

## 2016-07-03 LAB — HIV ANTIBODY (ROUTINE TESTING W REFLEX): HIV Screen 4th Generation wRfx: NONREACTIVE

## 2016-07-03 MED ORDER — TRAMADOL HCL 50 MG PO TABS
50.0000 mg | ORAL_TABLET | Freq: Four times a day (QID) | ORAL | Status: DC | PRN
  Administered 2016-07-03 – 2016-07-10 (×17): 50 mg via ORAL
  Filled 2016-07-03 (×17): qty 1

## 2016-07-03 MED ORDER — HYDRALAZINE HCL 20 MG/ML IJ SOLN
10.0000 mg | Freq: Once | INTRAMUSCULAR | Status: AC
  Administered 2016-07-03: 10 mg via INTRAVENOUS
  Filled 2016-07-03: qty 1

## 2016-07-03 MED ORDER — INFLUENZA VAC SPLIT QUAD 0.5 ML IM SUSY
0.5000 mL | PREFILLED_SYRINGE | INTRAMUSCULAR | Status: AC
  Administered 2016-07-04: 0.5 mL via INTRAMUSCULAR
  Filled 2016-07-03: qty 0.5

## 2016-07-03 NOTE — Progress Notes (Signed)
Pt. With elevated BP. [facility] make aware. RN will continue to monitor. [name], Cheryll DessertKaren Cherrell

## 2016-07-03 NOTE — Progress Notes (Signed)
PROGRESS NOTE    [name]  ZOX:[id number] DOB: 06-22-1956 DOA: 07/02/2016 PCP: No PCP Per Patient   Outpatient Specialists:     Brief Narrative:  [name] is a 60 y.o. gentleman with a history of chronic diastolic heart failure, HTN, HLD, diet controlled "borderline" DM, COPD, and asthma who presents to the ED for evaluation of progressive SOB x one week, chest tightness with increase wheezing and cough productive of yellow sputum x three days.  He is an inmate, and he reports that he has had sick contacts at his correctional facility.  No documented fever but he has had chills and sweats.  No nausea or vomiting.  No hemoptysis.  He has a had a sore throat and post-nasal drip.  He does not wear oxygen at baseline.   Assessment & Plan:   Principal Problem:   COPD exacerbation (HCC) Active Problems:   HTN (hypertension)   S/P VP shunt   Leukocytosis   CAP (community acquired pneumonia)  Strep PNA/COPD exacerbation IV Rocephin and PO azithromycin per pneumonia protocol Blood cultures streptococcal antigens- positive Check HIV Solumedrol 60mg  IV q6h Scheduled duonebs q4h while awake (HOLD Spiriva) Continue Symbicort (or formulary equivalent) mucinex  Atypical chest pain which I believe is related to his COPD exacerbation --Telemetry monitoring --Serial troponin  HTN --Continue home doses of HCTZ and amlodipine  Abdominal distention --KUB shows unremarkable gas pattern   DVT prophylaxis:  SCD's  Code Status: Full Code   Family Communication:   Disposition Plan:  probably meets admission criteria with strep PNA   Consultants:         Subjective: Breathing worsens with activity  Objective: Vitals:   07/02/16 2358 07/03/16 0454 07/03/16 0827 07/03/16 0828  BP: (!) 159/81 (!) 159/88    Pulse: 89 84    Resp: 18 18    Temp: 99 F (37.2 C) 98.1 F (36.7 C)    TempSrc: Oral Oral    SpO2: 96% 96% 94% 94%  Weight:  107.2 kg (236 lb 4.8 oz)     Height:        Intake/Output Summary (Last 24 hours) at 07/03/16 1125 Last data filed at 07/03/16 0805  Gross per 24 hour  Intake              730 ml  Output              175 ml  Net              555 ml   Filed Weights   07/02/16 2100 07/03/16 0454  Weight: 107.3 kg (236 lb 8 oz) 107.2 kg (236 lb 4.8 oz)    Examination:  General exam: Appears calm and comfortable  Respiratory system: coarse breath sounds and wheezing Cardiovascular system: S1 & S2 heard, RRR. No JVD, murmurs, rubs, gallops or clicks. No pedal edema. Gastrointestinal system: Abdomen is nondistended, soft and nontender. No organomegaly or masses felt. Normal bowel sounds heard. Central nervous system: Alert and oriented. No focal neurological deficits. Extremities: Symmetric 5 x 5 power.     Data Reviewed: I have personally reviewed following labs and imaging studies  CBC:  Recent Labs Lab 07/02/16 1440 07/03/16 0241  WBC 13.2* 17.2*  HGB 12.8* 12.9*  HCT 40.3 40.3  MCV 97.1 97.1  PLT 237 221   Basic Metabolic Panel:  Recent Labs Lab 07/02/16 1440 07/03/16 0241  NA 141 139  K 3.6 3.5  CL 107 104  CO2 27 25  GLUCOSE  104* 177*  BUN 12 12  CREATININE 0.95 1.05  CALCIUM 8.6* 9.0   GFR: Estimated Creatinine Clearance: 89.9 mL/min (by C-G formula based on SCr of 1.05 mg/dL). Liver Function Tests:  Recent Labs Lab 07/03/16 0241  AST 18  ALT 10*  ALKPHOS 48  BILITOT 0.9  PROT 7.1  ALBUMIN 3.6   No results for input(s): LIPASE, AMYLASE in the last 168 hours. No results for input(s): AMMONIA in the last 168 hours. Coagulation Profile: No results for input(s): INR, PROTIME in the last 168 hours. Cardiac Enzymes:  Recent Labs Lab 07/02/16 2149 07/03/16 0241  TROPONINI <0.03 <0.03   BNP (last 3 results) No results for input(s): PROBNP in the last 8760 hours. HbA1C: No results for input(s): HGBA1C in the last 72 hours. CBG:  Recent Labs Lab 07/03/16 0648  GLUCAP 147*    Lipid Profile: No results for input(s): CHOL, HDL, LDLCALC, TRIG, CHOLHDL, LDLDIRECT in the last 72 hours. Thyroid Function Tests: No results for input(s): TSH, T4TOTAL, FREET4, T3FREE, THYROIDAB in the last 72 hours. Anemia Panel: No results for input(s): VITAMINB12, FOLATE, FERRITIN, TIBC, IRON, RETICCTPCT in the last 72 hours. Urine analysis:    Component Value Date/Time   COLORURINE YELLOW 04/23/2015 1940   APPEARANCEUR CLEAR 04/23/2015 1940   LABSPEC 1.015 04/23/2015 1940   PHURINE 6.0 04/23/2015 1940   GLUCOSEU NEGATIVE 04/23/2015 1940   HGBUR NEGATIVE 04/23/2015 1940   BILIRUBINUR NEGATIVE 04/23/2015 1940   KETONESUR NEGATIVE 04/23/2015 1940   PROTEINUR NEGATIVE 04/23/2015 1940   UROBILINOGEN 0.2 04/23/2015 1940   NITRITE NEGATIVE 04/23/2015 1940   LEUKOCYTESUR NEGATIVE 04/23/2015 1940     ) Recent Results (from the past 240 hour(s))  Culture, blood (routine x 2) Call MD if unable to obtain prior to antibiotics being given     Status: None (Preliminary result)   Collection Time: 07/02/16 10:00 PM  Result Value Ref Range Status   Specimen Description BLOOD LEFT ANTECUBITAL  Final   Special Requests BOTTLES DRAWN AEROBIC AND ANAEROBIC 5CC  Final   Culture NO GROWTH < 12 HOURS  Final   Report Status PENDING  Incomplete  Culture, blood (routine x 2) Call MD if unable to obtain prior to antibiotics being given     Status: None (Preliminary result)   Collection Time: 07/02/16 10:10 PM  Result Value Ref Range Status   Specimen Description BLOOD RIGHT HAND  Final   Special Requests BOTTLES DRAWN AEROBIC AND ANAEROBIC 4CC  Final   Culture NO GROWTH < 12 HOURS  Final   Report Status PENDING  Incomplete  MRSA PCR Screening     Status: None   Collection Time: 07/02/16 11:45 PM  Result Value Ref Range Status   MRSA by PCR NEGATIVE NEGATIVE Final    Comment:        The GeneXpert MRSA Assay (FDA approved for NASAL specimens only), is one component of a comprehensive MRSA  colonization surveillance program. It is not intended to diagnose MRSA infection nor to guide or monitor treatment for MRSA infections.       Anti-infectives    Start     Dose/Rate Route Frequency Ordered Stop   07/03/16 1800  cefTRIAXone (ROCEPHIN) 1 g in dextrose 5 % 50 mL IVPB  Status:  Discontinued     1 g 100 mL/hr over 30 Minutes Intravenous Every 24 hours 07/02/16 2108 07/02/16 2117   07/03/16 1800  cefTRIAXone (ROCEPHIN) 2 g in dextrose 5 % 50 mL IVPB  2 g 100 mL/hr over 30 Minutes Intravenous Every 24 hours 07/02/16 2117 07/10/16 1759   07/03/16 1600  azithromycin (ZITHROMAX) tablet 500 mg     500 mg Oral Every 24 hours 07/02/16 2108 07/10/16 1559   07/02/16 1800  cefTRIAXone (ROCEPHIN) 1 g in dextrose 5 % 50 mL IVPB     1 g 100 mL/hr over 30 Minutes Intravenous  Once 07/02/16 1754 07/02/16 2012   07/02/16 1545  azithromycin (ZITHROMAX) 500 mg in dextrose 5 % 250 mL IVPB     500 mg 250 mL/hr over 60 Minutes Intravenous  Once 07/02/16 1542 07/02/16 1700       Radiology Studies: Dg Chest 2 View  Result Date: 07/03/2016 CLINICAL DATA:  Onset of cough and wheezing today. Follow-up of earlier pneumonia EXAM: CHEST  2 VIEW COMPARISON:  Portable chest x-ray of July 02, 2016 and May 20, 2016 and PA and lateral chest x-ray of November 12, 2015. FINDINGS: There remains increased density at the right lung base. There is chronic elevation of the right hemidiaphragm. The left lung is clear. The heart and pulmonary vascularity are normal. The mediastinum is normal in width. There is calcification in the wall of the aortic arch. The bony thorax is unremarkable. IMPRESSION: Persistent atelectasis or infiltrate in the right lower lobe posteriorly. Aortic atherosclerosis. Electronically Signed   By: David  SwazilandJordan M.D.   On: 07/03/2016 09:04   Dg Abd 1 View  Result Date: 07/03/2016 CLINICAL DATA:  Acute onset of generalized abdominal distention and abdominal pain. Initial  encounter. EXAM: ABDOMEN - 1 VIEW COMPARISON:  Abdominal radiograph performed 07/30/2011 FINDINGS: The visualized bowel gas pattern is unremarkable. Scattered air and stool filled loops of colon are seen; no abnormal dilatation of small bowel loops is seen to suggest small bowel obstruction. A single distended loop of small bowel is noted, nonspecific in appearance. No free intra-abdominal air is identified, though evaluation for free air is limited on supine views. The visualized osseous structures are within normal limits; the sacroiliac joints are unremarkable in appearance. A ventriculoperitoneal shunt is partially imaged, ending overlying the right hemipelvis. IMPRESSION: Unremarkable bowel gas pattern; no free intra-abdominal air seen. Small to moderate amount of stool noted in the colon. Electronically Signed   By: Roanna RaiderJeffery  Chang M.D.   On: 07/03/2016 02:27   Dg Chest Portable 1 View  Result Date: 07/02/2016 CLINICAL DATA:  Shortness of breath EXAM: PORTABLE CHEST 1 VIEW COMPARISON:  05/20/2016 FINDINGS: AP portable upright view chest. Right-sided shunt tubing is identified. Right hemidiaphragm is elevated as before. Hazy opacity in the right base could reflect atelectasis or a mild infiltrate. Stable cardiomediastinal silhouette with tortuous aorta. No pneumothorax. IMPRESSION: 1. Elevated right hemidiaphragm. Hazy atelectasis versus mild infiltrate right base. 2. Otherwise no significant interval changes Electronically Signed   By: Jasmine PangKim  Fujinaga M.D.   On: 07/02/2016 15:10        Scheduled Meds: . amLODipine  10 mg Oral Daily  . azithromycin  500 mg Oral Q24H  . cefTRIAXone (ROCEPHIN)  IV  2 g Intravenous Q24H  . guaiFENesin  600 mg Oral BID  . hydrochlorothiazide  25 mg Oral Daily  . [START ON 07/04/2016] Influenza vac split quadrivalent PF  0.5 mL Intramuscular Tomorrow-1000  . ipratropium-albuterol  3 mL Nebulization TID  . methylPREDNISolone (SOLU-MEDROL) injection  60 mg Intravenous  Q6H  . mometasone-formoterol  2 puff Inhalation BID   Continuous Infusions:    LOS: 0 days    Time  spent: 35 min    Hafsah Hendler Juanetta Gosling, DO [facility] Pager [phone]  If 7PM-7AM, please contact night-coverage [email or web address] Password [email or web address] 07/03/2016, 11:25 AM

## 2016-07-04 LAB — BASIC METABOLIC PANEL
ANION GAP: 12 (ref 5–15)
BUN: 17 mg/dL (ref 6–20)
CO2: 29 mmol/L (ref 22–32)
Calcium: 9.6 mg/dL (ref 8.9–10.3)
Chloride: 98 mmol/L — ABNORMAL LOW (ref 101–111)
Creatinine, Ser: 1 mg/dL (ref 0.61–1.24)
GFR calc Af Amer: >60 mL/min (ref >60)
Glucose, Bld: 137 mg/dL — ABNORMAL HIGH (ref 65–99)
POTASSIUM: 3.4 mmol/L — AB (ref 3.5–5.1)
SODIUM: 139 mmol/L (ref 135–145)

## 2016-07-04 LAB — CBC
HCT: 42.8 % (ref 39.0–52.0)
HEMOGLOBIN: 14.1 g/dL (ref 13.0–17.0)
MCH: 31.3 pg (ref 26.0–34.0)
MCHC: 32.9 g/dL (ref 30.0–36.0)
MCV: 94.9 fL (ref 78.0–100.0)
PLATELETS: 286 10*3/uL (ref 150–400)
RBC: 4.51 MIL/uL (ref 4.22–5.81)
RDW: 13.4 % (ref 11.5–15.5)
WBC: 31.3 10*3/uL — AB (ref 4.0–10.5)

## 2016-07-04 LAB — LEGIONELLA PNEUMOPHILA SEROGP 1 UR AG: L. PNEUMOPHILA SEROGP 1 UR AG: NEGATIVE

## 2016-07-04 MED ORDER — ALBUTEROL SULFATE (2.5 MG/3ML) 0.083% IN NEBU: 2.5000 mg | INHALATION_SOLUTION | RESPIRATORY_TRACT | Status: DC | PRN

## 2016-07-04 MED ORDER — IPRATROPIUM-ALBUTEROL 0.5-2.5 (3) MG/3ML IN SOLN
3.0000 mL | Freq: Four times a day (QID) | RESPIRATORY_TRACT | Status: DC
  Administered 2016-07-04 – 2016-07-06 (×8): 3 mL via RESPIRATORY_TRACT
  Filled 2016-07-04 (×8): qty 3

## 2016-07-04 MED ORDER — HYDRALAZINE HCL 25 MG PO TABS
25.0000 mg | ORAL_TABLET | Freq: Three times a day (TID) | ORAL | Status: DC
  Administered 2016-07-04 – 2016-07-12 (×24): 25 mg via ORAL
  Filled 2016-07-04 (×25): qty 1

## 2016-07-04 MED ORDER — POTASSIUM CHLORIDE CRYS ER 20 MEQ PO TBCR
40.0000 meq | EXTENDED_RELEASE_TABLET | Freq: Once | ORAL | Status: AC
  Administered 2016-07-04: 40 meq via ORAL
  Filled 2016-07-04: qty 2

## 2016-07-04 NOTE — Progress Notes (Signed)
PROGRESS NOTE    [name]  ZOX:[id number] DOB: [birth year] DOA: 07/02/2016 PCP: No PCP Per Patient   Outpatient Specialists:     Brief Narrative:  [name] is a 60 y.o. gentleman with a history of chronic diastolic heart failure, HTN, HLD, diet controlled "borderline" DM, COPD, and asthma who presents to the ED for evaluation of progressive SOB x one week, chest tightness with increase wheezing and cough productive of yellow sputum x three days.  He is an inmate, and he reports that he has had sick contacts at his correctional facility.  No documented fever but he has had chills and sweats.  No nausea or vomiting.  No hemoptysis.  He has a had a sore throat and post-nasal drip.  He does not wear oxygen at baseline.   Assessment & Plan:   Principal Problem:   COPD exacerbation (HCC) Active Problems:   HTN (hypertension)   S/P VP shunt   Leukocytosis   CAP (community acquired pneumonia)  Strep PNA/COPD exacerbation IV Rocephin and PO azithromycin per pneumonia protocol Blood cultures streptococcal antigens- positive  HIV non-reactive Solumedrol 60mg  IV q6h Scheduled duonebs q4h while awake (HOLD Spiriva) Continue Symbicort (or formulary equivalent) mucinex  Atypical chest pain which I believe is related to his COPD exacerbation --Telemetry monitoring --Serial troponin  HTN --Continue home doses of HCTZ and amlodipine  Abdominal distention --KUB shows unremarkable gas pattern   DVT prophylaxis:  SCD's  Code Status: Full Code   Family Communication:   Disposition Plan:  [facility] to prison when wheezing controlled   Consultants:         Subjective: Breathing worsens with activity  Objective: Vitals:   07/03/16 2215 07/04/16 0004 07/04/16 0415 07/04/16 0724  BP: (!) 164/93 (!) 160/93 (!) 166/104   Pulse:  95 85   Resp:  20 20   Temp:  98.2 F (36.8 C) 98.3 F (36.8 C)   TempSrc:  Oral Oral   SpO2:  93% 93% 94%  Weight:   106.9 kg (235 lb  11.2 oz)   Height:        Intake/Output Summary (Last 24 hours) at 07/04/16 1039 Last data filed at 07/04/16 0924  Gross per 24 hour  Intake             1090 ml  Output             1425 ml  Net             -335 ml   Filed Weights   07/02/16 2100 07/03/16 0454 07/04/16 0415  Weight: 107.3 kg (236 lb 8 oz) 107.2 kg (236 lb 4.8 oz) 106.9 kg (235 lb 11.2 oz)    Examination:  General exam: Appears calm and comfortable  Respiratory system: diminished with severe wheezing Cardiovascular system: S1 & S2 heard, RRR. No JVD, murmurs, rubs, gallops or clicks. No pedal edema. Gastrointestinal system: Abdomen is nondistended, soft and nontender. No organomegaly or masses felt. Normal bowel sounds heard. Central nervous system: Alert and oriented. No focal neurological deficits.      Data Reviewed: I have personally reviewed following labs and imaging studies  CBC:  Recent Labs Lab 07/02/16 1440 07/03/16 0241 07/04/16 0338  WBC 13.2* 17.2* 31.3*  HGB 12.8* 12.9* 14.1  HCT 40.3 40.3 42.8  MCV 97.1 97.1 94.9  PLT 237 221 286   Basic Metabolic Panel:  Recent Labs Lab 07/02/16 1440 07/03/16 0241 07/04/16 0338  NA 141 139 139  K 3.6 3.5  3.4*  CL 107 104 98*  CO2 27 25 29   GLUCOSE 104* 177* 137*  BUN 12 12 17   CREATININE 0.95 1.05 1.00  CALCIUM 8.6* 9.0 9.6   GFR: Estimated Creatinine Clearance: 94.3 mL/min (by C-G formula based on SCr of 1 mg/dL). Liver Function Tests:  Recent Labs Lab 07/03/16 0241  AST 18  ALT 10*  ALKPHOS 48  BILITOT 0.9  PROT 7.1  ALBUMIN 3.6   No results for input(s): LIPASE, AMYLASE in the last 168 hours. No results for input(s): AMMONIA in the last 168 hours. Coagulation Profile: No results for input(s): INR, PROTIME in the last 168 hours. Cardiac Enzymes:  Recent Labs Lab 07/02/16 2149 07/03/16 0241 07/03/16 1226  TROPONINI <0.03 <0.03 <0.03   BNP (last 3 results) No results for input(s): PROBNP in the last 8760  hours. HbA1C: No results for input(s): HGBA1C in the last 72 hours. CBG:  Recent Labs Lab 07/03/16 0648  GLUCAP 147*   Lipid Profile: No results for input(s): CHOL, HDL, LDLCALC, TRIG, CHOLHDL, LDLDIRECT in the last 72 hours. Thyroid Function Tests: No results for input(s): TSH, T4TOTAL, FREET4, T3FREE, THYROIDAB in the last 72 hours. Anemia Panel: No results for input(s): VITAMINB12, FOLATE, FERRITIN, TIBC, IRON, RETICCTPCT in the last 72 hours. Urine analysis:    Component Value Date/Time   COLORURINE YELLOW 04/23/2015 1940   APPEARANCEUR CLEAR 04/23/2015 1940   LABSPEC 1.015 04/23/2015 1940   PHURINE 6.0 04/23/2015 1940   GLUCOSEU NEGATIVE 04/23/2015 1940   HGBUR NEGATIVE 04/23/2015 1940   BILIRUBINUR NEGATIVE 04/23/2015 1940   KETONESUR NEGATIVE 04/23/2015 1940   PROTEINUR NEGATIVE 04/23/2015 1940   UROBILINOGEN 0.2 04/23/2015 1940   NITRITE NEGATIVE 04/23/2015 1940   LEUKOCYTESUR NEGATIVE 04/23/2015 1940     ) Recent Results (from the past 240 hour(s))  Culture, blood (routine x 2) Call MD if unable to obtain prior to antibiotics being given     Status: None (Preliminary result)   Collection Time: 07/02/16 10:00 PM  Result Value Ref Range Status   Specimen Description BLOOD LEFT ANTECUBITAL  Final   Special Requests BOTTLES DRAWN AEROBIC AND ANAEROBIC 5CC  Final   Culture NO GROWTH < 24 HOURS  Final   Report Status PENDING  Incomplete  Culture, blood (routine x 2) Call MD if unable to obtain prior to antibiotics being given     Status: None (Preliminary result)   Collection Time: 07/02/16 10:10 PM  Result Value Ref Range Status   Specimen Description BLOOD RIGHT HAND  Final   Special Requests BOTTLES DRAWN AEROBIC AND ANAEROBIC 4CC  Final   Culture NO GROWTH < 24 HOURS  Final   Report Status PENDING  Incomplete  MRSA PCR Screening     Status: None   Collection Time: 07/02/16 11:45 PM  Result Value Ref Range Status   MRSA by PCR NEGATIVE NEGATIVE Final     Comment:        The GeneXpert MRSA Assay (FDA approved for NASAL specimens only), is one component of a comprehensive MRSA colonization surveillance program. It is not intended to diagnose MRSA infection nor to guide or monitor treatment for MRSA infections.       Anti-infectives    Start     Dose/Rate Route Frequency Ordered Stop   07/03/16 1800  cefTRIAXone (ROCEPHIN) 1 g in dextrose 5 % 50 mL IVPB  Status:  Discontinued     1 g 100 mL/hr over 30 Minutes Intravenous Every 24 hours 07/02/16 2108  07/02/16 2117   07/03/16 1800  cefTRIAXone (ROCEPHIN) 2 g in dextrose 5 % 50 mL IVPB     2 g 100 mL/hr over 30 Minutes Intravenous Every 24 hours 07/02/16 2117 07/10/16 1759   07/03/16 1600  azithromycin (ZITHROMAX) tablet 500 mg     500 mg Oral Every 24 hours 07/02/16 2108 07/10/16 1559   07/02/16 1800  cefTRIAXone (ROCEPHIN) 1 g in dextrose 5 % 50 mL IVPB     1 g 100 mL/hr over 30 Minutes Intravenous  Once 07/02/16 1754 07/02/16 2012   07/02/16 1545  azithromycin (ZITHROMAX) 500 mg in dextrose 5 % 250 mL IVPB     500 mg 250 mL/hr over 60 Minutes Intravenous  Once 07/02/16 1542 07/02/16 1700       Radiology Studies: Dg Chest 2 View  Result Date: 07/03/2016 CLINICAL DATA:  Onset of cough and wheezing today. Follow-up of earlier pneumonia EXAM: CHEST  2 VIEW COMPARISON:  Portable chest x-ray of July 02, 2016 and May 20, 2016 and PA and lateral chest x-ray of November 12, 2015. FINDINGS: There remains increased density at the right lung base. There is chronic elevation of the right hemidiaphragm. The left lung is clear. The heart and pulmonary vascularity are normal. The mediastinum is normal in width. There is calcification in the wall of the aortic arch. The bony thorax is unremarkable. IMPRESSION: Persistent atelectasis or infiltrate in the right lower lobe posteriorly. Aortic atherosclerosis. Electronically Signed   By: David  [place] M.D.   On: 07/03/2016 09:04   Dg Abd 1  View  Result Date: 07/03/2016 CLINICAL DATA:  Acute onset of generalized abdominal distention and abdominal pain. Initial encounter. EXAM: ABDOMEN - 1 VIEW COMPARISON:  Abdominal radiograph performed 07/30/2011 FINDINGS: The visualized bowel gas pattern is unremarkable. Scattered air and stool filled loops of colon are seen; no abnormal dilatation of small bowel loops is seen to suggest small bowel obstruction. A single distended loop of small bowel is noted, nonspecific in appearance. No free intra-abdominal air is identified, though evaluation for free air is limited on supine views. The visualized osseous structures are within normal limits; the sacroiliac joints are unremarkable in appearance. A ventriculoperitoneal shunt is partially imaged, ending overlying the right hemipelvis. IMPRESSION: Unremarkable bowel gas pattern; no free intra-abdominal air seen. Small to moderate amount of stool noted in the colon. Electronically Signed   By: Roanna Raider M.D.   On: 07/03/2016 02:27   Dg Chest Portable 1 View  Result Date: 07/02/2016 CLINICAL DATA:  Shortness of breath EXAM: PORTABLE CHEST 1 VIEW COMPARISON:  05/20/2016 FINDINGS: AP portable upright view chest. Right-sided shunt tubing is identified. Right hemidiaphragm is elevated as before. Hazy opacity in the right base could reflect atelectasis or a mild infiltrate. Stable cardiomediastinal silhouette with tortuous aorta. No pneumothorax. IMPRESSION: 1. Elevated right hemidiaphragm. Hazy atelectasis versus mild infiltrate right base. 2. Otherwise no significant interval changes Electronically Signed   By: Jasmine Pang M.D.   On: 07/02/2016 15:10        Scheduled Meds: . amLODipine  10 mg Oral Daily  . azithromycin  500 mg Oral Q24H  . cefTRIAXone (ROCEPHIN)  IV  2 g Intravenous Q24H  . guaiFENesin  600 mg Oral BID  . hydrochlorothiazide  25 mg Oral Daily  . Influenza vac split quadrivalent PF  0.5 mL Intramuscular Tomorrow-1000  .  ipratropium-albuterol  3 mL Nebulization TID  . methylPREDNISolone (SOLU-MEDROL) injection  60 mg Intravenous Q6H  . mometasone-formoterol  2 puff Inhalation BID  . potassium chloride  40 mEq Oral Once   Continuous Infusions:    LOS: 1 day    Time spent: 35 min    Remmington Teters Juanetta GoslingU Glenden Rossell, DO [facility] Pager [phone]  If 7PM-7AM, please contact night-coverage [email or web address] Password [email or web address] 07/04/2016, 10:39 AM

## 2016-07-05 LAB — BASIC METABOLIC PANEL
Anion gap: 11 (ref 5–15)
BUN: 24 mg/dL — ABNORMAL HIGH (ref 6–20)
CO2: 29 mmol/L (ref 22–32)
Calcium: 9.1 mg/dL (ref 8.9–10.3)
Chloride: 98 mmol/L — ABNORMAL LOW (ref 101–111)
Creatinine, Ser: 1.1 mg/dL (ref 0.61–1.24)
GFR calc Af Amer: >60 mL/min (ref >60)
GFR calc non Af Amer: >60 mL/min (ref >60)
Glucose, Bld: 209 mg/dL — ABNORMAL HIGH (ref 65–99)
Potassium: 3.5 mmol/L (ref 3.5–5.1)
Sodium: 138 mmol/L (ref 135–145)

## 2016-07-05 LAB — CBC
HCT: 42.8 % (ref 39.0–52.0)
Hemoglobin: 13.9 g/dL (ref 13.0–17.0)
MCH: 31 pg (ref 26.0–34.0)
MCHC: 32.5 g/dL (ref 30.0–36.0)
MCV: 95.5 fL (ref 78.0–100.0)
Platelets: 278 10*3/uL (ref 150–400)
RBC: 4.48 MIL/uL (ref 4.22–5.81)
RDW: 13.5 % (ref 11.5–15.5)
WBC: 28.1 10*3/uL — ABNORMAL HIGH (ref 4.0–10.5)

## 2016-07-05 LAB — MAGNESIUM: Magnesium: 2.3 mg/dL (ref 1.7–2.4)

## 2016-07-05 MED ORDER — CEPHALEXIN 500 MG PO CAPS
500.0000 mg | ORAL_CAPSULE | Freq: Two times a day (BID) | ORAL | Status: DC
  Administered 2016-07-05 – 2016-07-10 (×11): 500 mg via ORAL
  Filled 2016-07-05 (×11): qty 1

## 2016-07-05 MED ORDER — POTASSIUM CHLORIDE CRYS ER 20 MEQ PO TBCR
40.0000 meq | EXTENDED_RELEASE_TABLET | Freq: Once | ORAL | Status: AC
  Administered 2016-07-05: 40 meq via ORAL
  Filled 2016-07-05: qty 2

## 2016-07-05 MED ORDER — METHYLPREDNISOLONE SODIUM SUCC 125 MG IJ SOLR
60.0000 mg | Freq: Two times a day (BID) | INTRAMUSCULAR | Status: DC
  Administered 2016-07-05 – 2016-07-06 (×2): 60 mg via INTRAVENOUS
  Filled 2016-07-05 (×2): qty 2

## 2016-07-05 MED ORDER — POLYETHYLENE GLYCOL 3350 17 G PO PACK
17.0000 g | PACK | Freq: Every day | ORAL | Status: DC
  Administered 2016-07-05 – 2016-07-10 (×6): 17 g via ORAL
  Filled 2016-07-05 (×7): qty 1

## 2016-07-05 NOTE — Progress Notes (Signed)
Pt with 13 beats of non-sustained V tach. Pt asymptomatic and BP improved (152/91). MD paged for AM labs. Will continue to monitor.

## 2016-07-05 NOTE — Progress Notes (Signed)
PROGRESS NOTE    [name]  ZOX:[id number] DOB: Feb 07, 1956 DOA: 07/02/2016 PCP: No PCP Per Patient   Outpatient Specialists:     Brief Narrative:  [name] is a 60 y.o. gentleman with a history of chronic diastolic heart failure, HTN, HLD, diet controlled "borderline" DM, COPD, and asthma who presents to the ED for evaluation of progressive SOB x one week, chest tightness with increase wheezing and cough productive of yellow sputum x three days.  He is an inmate, and he reports that he has had sick contacts at his correctional facility.  No documented fever but he has had chills and sweats.  No nausea or vomiting.  No hemoptysis.  He has a had a sore throat and post-nasal drip.  He does not wear oxygen at baseline.  + strep pna-- slowly improving suspect ready to d/c in 1-2 days   Assessment & Plan:   Principal Problem:   COPD exacerbation (HCC) Active Problems:   HTN (hypertension)   S/P VP shunt   Leukocytosis   CAP (community acquired pneumonia)  Strep PNA/COPD exacerbation IV Rocephin and PO azithromycin per pneumonia protocol- change to PO today Blood cultures streptococcal antigens- positive  HIV non-reactive Solumedrol 60mg  IV- wean to PO in AM Scheduled duonebs q4h while awake (HOLD Spiriva) Continue Symbicort (or formulary equivalent) mucinex -home O2 eval  Atypical chest pain which I believe is related to his COPD exacerbation --Telemetry monitoring --Serial troponin  HTN --Continue home doses of HCTZ and amlodipine  Abdominal distention --KUB shows unremarkable gas pattern -miralax daily  DVT prophylaxis:  SCD's  Code Status: Full Code   Family Communication:   Disposition Plan:  1-2 days and back to [facility]   Consultants:         Subjective: Productive cough  Objective: Vitals:   07/05/16 0005 07/05/16 0449 07/05/16 0844 07/05/16 1142  BP: (!) 152/91 (!) 142/82    Pulse: 86 71    Resp: 18 18    Temp: 98.4 F (36.9 C)  97.6 F (36.4 C)    TempSrc: Oral Oral    SpO2: 93% 92% 92% 94%  Weight:  106.7 kg (235 lb 3.2 oz)    Height:        Intake/Output Summary (Last 24 hours) at 07/05/16 1153 Last data filed at 07/05/16 1118  Gross per 24 hour  Intake              720 ml  Output              775 ml  Net              -55 ml   Filed Weights   07/03/16 0454 07/04/16 0415 07/05/16 0449  Weight: 107.2 kg (236 lb 4.8 oz) 106.9 kg (235 lb 11.2 oz) 106.7 kg (235 lb 3.2 oz)    Examination:  General exam: Appears calm and comfortable  Respiratory system: moving more air, no wheezing-- appears to be forcing some stridorour breathing Cardiovascular system: S1 & S2 heard, RRR. No JVD, murmurs, rubs, gallops or clicks. No pedal edema. Gastrointestinal system: Abdomen is nondistended, soft and nontender. No organomegaly or masses felt. Normal bowel sounds heard. Central nervous system: Alert and oriented. No focal neurological deficits.      Data Reviewed: I have personally reviewed following labs and imaging studies  CBC:  Recent Labs Lab 07/02/16 1440 07/03/16 0241 07/04/16 0338 07/05/16 0255  WBC 13.2* 17.2* 31.3* 28.1*  HGB 12.8* 12.9* 14.1 13.9  HCT  40.3 40.3 42.8 42.8  MCV 97.1 97.1 94.9 95.5  PLT 237 221 286 278   Basic Metabolic Panel:  Recent Labs Lab 07/02/16 1440 07/03/16 0241 07/04/16 0338 07/05/16 0255  NA 141 139 139 138  K 3.6 3.5 3.4* 3.5  CL 107 104 98* 98*  CO2 27 25 29 29   GLUCOSE 104* 177* 137* 209*  BUN 12 12 17  24*  CREATININE 0.95 1.05 1.00 1.10  CALCIUM 8.6* 9.0 9.6 9.1  MG  --   --   --  2.3   GFR: Estimated Creatinine Clearance: 85.6 mL/min (by C-G formula based on SCr of 1.1 mg/dL). Liver Function Tests:  Recent Labs Lab 07/03/16 0241  AST 18  ALT 10*  ALKPHOS 48  BILITOT 0.9  PROT 7.1  ALBUMIN 3.6   No results for input(s): LIPASE, AMYLASE in the last 168 hours. No results for input(s): AMMONIA in the last 168 hours. Coagulation Profile: No  results for input(s): INR, PROTIME in the last 168 hours. Cardiac Enzymes:  Recent Labs Lab 07/02/16 2149 07/03/16 0241 07/03/16 1226  TROPONINI <0.03 <0.03 <0.03   BNP (last 3 results) No results for input(s): PROBNP in the last 8760 hours. HbA1C: No results for input(s): HGBA1C in the last 72 hours. CBG:  Recent Labs Lab 07/03/16 0648  GLUCAP 147*   Lipid Profile: No results for input(s): CHOL, HDL, LDLCALC, TRIG, CHOLHDL, LDLDIRECT in the last 72 hours. Thyroid Function Tests: No results for input(s): TSH, T4TOTAL, FREET4, T3FREE, THYROIDAB in the last 72 hours. Anemia Panel: No results for input(s): VITAMINB12, FOLATE, FERRITIN, TIBC, IRON, RETICCTPCT in the last 72 hours. Urine analysis:    Component Value Date/Time   COLORURINE YELLOW 04/23/2015 1940   APPEARANCEUR CLEAR 04/23/2015 1940   LABSPEC 1.015 04/23/2015 1940   PHURINE 6.0 04/23/2015 1940   GLUCOSEU NEGATIVE 04/23/2015 1940   HGBUR NEGATIVE 04/23/2015 1940   BILIRUBINUR NEGATIVE 04/23/2015 1940   KETONESUR NEGATIVE 04/23/2015 1940   PROTEINUR NEGATIVE 04/23/2015 1940   UROBILINOGEN 0.2 04/23/2015 1940   NITRITE NEGATIVE 04/23/2015 1940   LEUKOCYTESUR NEGATIVE 04/23/2015 1940     ) Recent Results (from the past 240 hour(s))  Culture, blood (routine x 2) Call MD if unable to obtain prior to antibiotics being given     Status: None (Preliminary result)   Collection Time: 07/02/16 10:00 PM  Result Value Ref Range Status   Specimen Description BLOOD LEFT ANTECUBITAL  Final   Special Requests BOTTLES DRAWN AEROBIC AND ANAEROBIC 5CC  Final   Culture NO GROWTH 2 DAYS  Final   Report Status PENDING  Incomplete  Culture, blood (routine x 2) Call MD if unable to obtain prior to antibiotics being given     Status: None (Preliminary result)   Collection Time: 07/02/16 10:10 PM  Result Value Ref Range Status   Specimen Description BLOOD RIGHT HAND  Final   Special Requests BOTTLES DRAWN AEROBIC AND ANAEROBIC  4CC  Final   Culture NO GROWTH 2 DAYS  Final   Report Status PENDING  Incomplete  MRSA PCR Screening     Status: None   Collection Time: 07/02/16 11:45 PM  Result Value Ref Range Status   MRSA by PCR NEGATIVE NEGATIVE Final    Comment:        The GeneXpert MRSA Assay (FDA approved for NASAL specimens only), is one component of a comprehensive MRSA colonization surveillance program. It is not intended to diagnose MRSA infection nor to guide or monitor treatment  for MRSA infections.       Anti-infectives    Start     Dose/Rate Route Frequency Ordered Stop   07/05/16 1000  cephALEXin (KEFLEX) capsule 500 mg     500 mg Oral Every 12 hours 07/05/16 0924     07/03/16 1800  cefTRIAXone (ROCEPHIN) 1 g in dextrose 5 % 50 mL IVPB  Status:  Discontinued     1 g 100 mL/hr over 30 Minutes Intravenous Every 24 hours 07/02/16 2108 07/02/16 2117   07/03/16 1800  cefTRIAXone (ROCEPHIN) 2 g in dextrose 5 % 50 mL IVPB  Status:  Discontinued     2 g 100 mL/hr over 30 Minutes Intravenous Every 24 hours 07/02/16 2117 07/05/16 0924   07/03/16 1600  azithromycin (ZITHROMAX) tablet 500 mg  Status:  Discontinued     500 mg Oral Every 24 hours 07/02/16 2108 07/05/16 0924   07/02/16 1800  cefTRIAXone (ROCEPHIN) 1 g in dextrose 5 % 50 mL IVPB     1 g 100 mL/hr over 30 Minutes Intravenous  Once 07/02/16 1754 07/02/16 2012   07/02/16 1545  azithromycin (ZITHROMAX) 500 mg in dextrose 5 % 250 mL IVPB     500 mg 250 mL/hr over 60 Minutes Intravenous  Once 07/02/16 1542 07/02/16 1700       Radiology Studies: No results found.      Scheduled Meds: . amLODipine  10 mg Oral Daily  . cephALEXin  500 mg Oral Q12H  . guaiFENesin  600 mg Oral BID  . hydrALAZINE  25 mg Oral Q8H  . hydrochlorothiazide  25 mg Oral Daily  . ipratropium-albuterol  3 mL Nebulization QID  . methylPREDNISolone (SOLU-MEDROL) injection  60 mg Intravenous Q12H  . mometasone-formoterol  2 puff Inhalation BID  . polyethylene  glycol  17 g Oral Daily   Continuous Infusions:    LOS: 2 days    Time spent: 35 min    Jolana Runkles Juanetta Gosling, DO [facility] Pager [phone]  If 7PM-7AM, please contact night-coverage [email or web address] Password [email or web address] 07/05/2016, 11:53 AM

## 2016-07-06 MED ORDER — IPRATROPIUM-ALBUTEROL 0.5-2.5 (3) MG/3ML IN SOLN
3.0000 mL | Freq: Three times a day (TID) | RESPIRATORY_TRACT | Status: DC
  Administered 2016-07-06 – 2016-07-08 (×5): 3 mL via RESPIRATORY_TRACT
  Filled 2016-07-06 (×5): qty 3

## 2016-07-06 MED ORDER — METHYLPREDNISOLONE SODIUM SUCC 40 MG IJ SOLR
40.0000 mg | Freq: Every day | INTRAMUSCULAR | Status: DC
  Administered 2016-07-07 – 2016-07-09 (×3): 40 mg via INTRAVENOUS
  Filled 2016-07-06 (×3): qty 1

## 2016-07-06 NOTE — Progress Notes (Signed)
Patient refused bed alarm. Will continue to monitor patient. 

## 2016-07-06 NOTE — Progress Notes (Signed)
PROGRESS NOTE    [name]  ZOX:[id number] DOB: 06/27/1956 DOA: 07/02/2016 PCP: No PCP Per Patient   Brief Narrative:  60 y.o. gentleman with a history of chronic diastolic heart failure, HTN, HLD, diet controlled "borderline" DM, COPD, and asthma who presents to the ED for evaluation of progressive SOB x one week, chest tightness with increase wheezing and cough productive of yellow sputum x three days.  He is an inmate, and he reports that he has had sick contacts at his correctional facility.  No documented fever but he has had chills and sweats.  No nausea or vomiting.  No hemoptysis.  He has a had a sore throat and post-nasal drip.  He does not wear oxygen at baseline.  + strep pna-- slowly improving suspect ready to d/c in 1-2 days  Assessment & Plan:   Strep PNA/COPD exacerbation - IV Rocephin and PO azithromycin per pneumonia protocol initially started, now transitioned to oral Cephalexin  - Blood cultures streptococcal antigens- positive - HIV non-reactive - Solumedrol 60mg  IV- change to PO in AM and continue taper - Scheduled duonebs q4h while awake (HOLD Spiriva) - Continue Symbicort (or formulary equivalent) - check oxygen level with ambulation   Atypical chest pain  - suspect related to his COPD exacerbation - ok to d/c tele, chest pain resolved   - CE x 3 negative   HTN, essential  - Continue home doses of HCTZ and amlodipine  Abdominal distention - KUB shows unremarkable gas pattern - miralax daily  DVT prophylaxis:  SCD's  Code Status: Full Code  Family Communication: None  Disposition Plan:  In AM   Subjective: Productive cough  Objective: Vitals:   07/06/16 0504 07/06/16 0806 07/06/16 0818 07/06/16 0823  BP: (!) 184/97 (!) 142/94    Pulse: 74 76 76   Resp: 18 18 16    Temp: 97.8 F (36.6 C) 97.7 F (36.5 C)    TempSrc: Oral Oral    SpO2: 93% 94% 95% 95%  Weight: 107.6 kg (237 lb 3.2 oz)     Height:        Intake/Output Summary (Last  24 hours) at 07/06/16 1301 Last data filed at 07/06/16 0900  Gross per 24 hour  Intake              960 ml  Output              400 ml  Net              560 ml   Filed Weights   07/04/16 0415 07/05/16 0449 07/06/16 0504  Weight: 106.9 kg (235 lb 11.2 oz) 106.7 kg (235 lb 3.2 oz) 107.6 kg (237 lb 3.2 oz)    Examination:  General exam: Appears calm and comfortable  Respiratory system: moving more air, more exp wheezing and rhonchi at bases  Cardiovascular system: S1 & S2 heard, RRR. No JVD, murmurs, rubs, gallops or clicks. No pedal edema. Gastrointestinal system: Abdomen is nondistended, soft and nontender. No organomegaly or masses felt.  Central nervous system: Alert and oriented. No focal neurological deficits.   Data Reviewed: I have personally reviewed following labs and imaging studies  CBC:  Recent Labs Lab 07/02/16 1440 07/03/16 0241 07/04/16 0338 07/05/16 0255  WBC 13.2* 17.2* 31.3* 28.1*  HGB 12.8* 12.9* 14.1 13.9  HCT 40.3 40.3 42.8 42.8  MCV 97.1 97.1 94.9 95.5  PLT 237 221 286 278   Basic Metabolic Panel:  Recent Labs Lab 07/02/16 1440 07/03/16 0241  07/04/16 0338 07/05/16 0255  NA 141 139 139 138  K 3.6 3.5 3.4* 3.5  CL 107 104 98* 98*  CO2 27 25 29 29   GLUCOSE 104* 177* 137* 209*  BUN 12 12 17  24*  CREATININE 0.95 1.05 1.00 1.10  CALCIUM 8.6* 9.0 9.6 9.1  MG  --   --   --  2.3   Liver Function Tests:  Recent Labs Lab 07/03/16 0241  AST 18  ALT 10*  ALKPHOS 48  BILITOT 0.9  PROT 7.1  ALBUMIN 3.6   Cardiac Enzymes:  Recent Labs Lab 07/02/16 2149 07/03/16 0241 07/03/16 1226  TROPONINI <0.03 <0.03 <0.03   CBG:  Recent Labs Lab 07/03/16 0648  GLUCAP 147*    Recent Results (from the past 240 hour(s))  Culture, blood (routine x 2) Call MD if unable to obtain prior to antibiotics being given     Status: None (Preliminary result)   Collection Time: 07/02/16 10:00 PM  Result Value Ref Range Status   Specimen Description BLOOD  LEFT ANTECUBITAL  Final   Special Requests BOTTLES DRAWN AEROBIC AND ANAEROBIC 5CC  Final   Culture NO GROWTH 4 DAYS  Final   Report Status PENDING  Incomplete  Culture, blood (routine x 2) Call MD if unable to obtain prior to antibiotics being given     Status: None (Preliminary result)   Collection Time: 07/02/16 10:10 PM  Result Value Ref Range Status   Specimen Description BLOOD RIGHT HAND  Final   Special Requests BOTTLES DRAWN AEROBIC AND ANAEROBIC 4CC  Final   Culture NO GROWTH 4 DAYS  Final   Report Status PENDING  Incomplete  MRSA PCR Screening     Status: None   Collection Time: 07/02/16 11:45 PM  Result Value Ref Range Status   MRSA by PCR NEGATIVE NEGATIVE Final    Comment:        The GeneXpert MRSA Assay (FDA approved for NASAL specimens only), is one component of a comprehensive MRSA colonization surveillance program. It is not intended to diagnose MRSA infection nor to guide or monitor treatment for MRSA infections.       Anti-infectives    Start     Dose/Rate Route Frequency Ordered Stop   07/05/16 1000  cephALEXin (KEFLEX) capsule 500 mg     500 mg Oral Every 12 hours 07/05/16 0924     07/03/16 1800  cefTRIAXone (ROCEPHIN) 1 g in dextrose 5 % 50 mL IVPB  Status:  Discontinued     1 g 100 mL/hr over 30 Minutes Intravenous Every 24 hours 07/02/16 2108 07/02/16 2117   07/03/16 1800  cefTRIAXone (ROCEPHIN) 2 g in dextrose 5 % 50 mL IVPB  Status:  Discontinued     2 g 100 mL/hr over 30 Minutes Intravenous Every 24 hours 07/02/16 2117 07/05/16 0924   07/03/16 1600  azithromycin (ZITHROMAX) tablet 500 mg  Status:  Discontinued     500 mg Oral Every 24 hours 07/02/16 2108 07/05/16 0924   07/02/16 1800  cefTRIAXone (ROCEPHIN) 1 g in dextrose 5 % 50 mL IVPB     1 g 100 mL/hr over 30 Minutes Intravenous  Once 07/02/16 1754 07/02/16 2012   07/02/16 1545  azithromycin (ZITHROMAX) 500 mg in dextrose 5 % 250 mL IVPB     500 mg 250 mL/hr over 60 Minutes Intravenous   Once 07/02/16 1542 07/02/16 1700      Radiology Studies: No results found.  Scheduled Meds: . amLODipine  10  mg Oral Daily  . cephALEXin  500 mg Oral Q12H  . guaiFENesin  600 mg Oral BID  . hydrALAZINE  25 mg Oral Q8H  . hydrochlorothiazide  25 mg Oral Daily  . ipratropium-albuterol  3 mL Nebulization TID  . methylPREDNISolone (SOLU-MEDROL) injection  60 mg Intravenous Q12H  . mometasone-formoterol  2 puff Inhalation BID  . polyethylene glycol  17 g Oral Daily   Continuous Infusions:    LOS: 3 days   Time spent: 35 min  Debbora Presto, DO [facility] Pager [phone]  If 7PM-7AM, please contact night-coverage [email or web address] Password [email or web address] 07/06/2016, 1:01 PM

## 2016-07-07 DIAGNOSIS — A491 Streptococcal infection, unspecified site: Secondary | ICD-10-CM

## 2016-07-07 LAB — CULTURE, BLOOD (ROUTINE X 2)
CULTURE: NO GROWTH
Culture: NO GROWTH

## 2016-07-07 LAB — BASIC METABOLIC PANEL
Anion gap: 9 (ref 5–15)
BUN: 24 mg/dL — AB (ref 6–20)
CALCIUM: 9 mg/dL (ref 8.9–10.3)
CO2: 36 mmol/L — AB (ref 22–32)
CREATININE: 1.07 mg/dL (ref 0.61–1.24)
Chloride: 94 mmol/L — ABNORMAL LOW (ref 101–111)
GFR calc non Af Amer: >60 mL/min (ref >60)
GLUCOSE: 217 mg/dL — AB (ref 65–99)
Potassium: 3.2 mmol/L — ABNORMAL LOW (ref 3.5–5.1)
Sodium: 139 mmol/L (ref 135–145)

## 2016-07-07 LAB — CBC
HEMATOCRIT: 41.4 % (ref 39.0–52.0)
Hemoglobin: 13.2 g/dL (ref 13.0–17.0)
MCH: 30.6 pg (ref 26.0–34.0)
MCHC: 31.9 g/dL (ref 30.0–36.0)
MCV: 95.8 fL (ref 78.0–100.0)
Platelets: 247 10*3/uL (ref 150–400)
RBC: 4.32 MIL/uL (ref 4.22–5.81)
RDW: 13.3 % (ref 11.5–15.5)
WBC: 22.5 10*3/uL — ABNORMAL HIGH (ref 4.0–10.5)

## 2016-07-07 MED ORDER — POTASSIUM CHLORIDE CRYS ER 20 MEQ PO TBCR
40.0000 meq | EXTENDED_RELEASE_TABLET | Freq: Once | ORAL | Status: AC
  Administered 2016-07-07: 40 meq via ORAL
  Filled 2016-07-07: qty 2

## 2016-07-07 NOTE — Progress Notes (Signed)
PROGRESS NOTE    Vernon MarshallJimmy Mendoza  ZOX:[id number]:[id number] DOB: Jun 19, 1956 DOA: 07/02/2016 PCP: No PCP Per Patient   Brief Narrative:  60 y.o. gentleman with a history of chronic diastolic heart failure, HTN, HLD, diet controlled "borderline" DM, COPD, and asthma who presents to the ED for evaluation of progressive SOB x one week, chest tightness with increase wheezing and cough productive of yellow sputum x three days.  He is an inmate, and he reports that he has had sick contacts at his correctional facility.  No documented fever but he has had chills and sweats.  No nausea or vomiting.  No hemoptysis.  He has a had a sore throat and post-nasal drip.  He does not wear oxygen at baseline.  + strep pna-- slowly improving suspect ready to d/c in 1-2 days  Assessment & Plan:   Strep PNA/COPD exacerbation - IV Rocephin and PO azithromycin per pneumonia protocol initially started, now transitioned to oral Cephalexin  - Blood cultures streptococcal antigens- positive - HIV non-reactive - pt overall improving but still with rather overwhelming cough and more rhonchi on exam as well as wheezing  - will continue solumedrol with no tapering yet until respiratory status improves - Scheduled duonebs q4h while awake (HOLD Spiriva) - Continue Symbicort (or formulary equivalent)  Atypical chest pain  - suspect related to his COPD exacerbation - d/c tele, chest pain resolved   - CE x 3 negative   HTN, essential  - Continue home doses of HCTZ and amlodipine  Hypokalemia - mild, supplement and repeat BMP in AM  Abdominal distention - KUB shows unremarkable gas pattern - miralax daily  DVT prophylaxis:  SCD's  Code Status: Full Code  Family Communication: None  Disposition Plan:  Possibly in 1- 2 days depending on the respiratory status.  Subjective: Still with productive cough  Objective: Vitals:   07/06/16 1953 07/06/16 2105 07/07/16 0438 07/07/16 0922  BP: (!) 160/85  (!) 146/88 133/88    Pulse: 92 84 85 86  Resp: 18 16 18 18   Temp: 98.1 F (36.7 C)  98.5 F (36.9 C) 98.4 F (36.9 C)  TempSrc: Oral  Oral Oral  SpO2: 93%  93% 92%  Weight:   108.1 kg (238 lb 6.4 oz)   Height:        Intake/Output Summary (Last 24 hours) at 07/07/16 1038 Last data filed at 07/07/16 0700  Gross per 24 hour  Intake              802 ml  Output              700 ml  Net              102 ml   Filed Weights   07/05/16 0449 07/06/16 0504 07/07/16 0438  Weight: 106.7 kg (235 lb 3.2 oz) 107.6 kg (237 lb 3.2 oz) 108.1 kg (238 lb 6.4 oz)    Examination:  General exam: Appears calm and comfortable  Respiratory system: more rhonchi at bases right side worse then left, exp wheezing also noted  Cardiovascular system: S1 & S2 heard, RRR. No JVD, murmurs, rubs, gallops or clicks. No pedal edema. Gastrointestinal system: Abdomen is nondistended, soft and nontender. No organomegaly or masses felt.  Central nervous system: Alert and oriented. No focal neurological deficits.   Data Reviewed: I have personally reviewed following labs and imaging studies  CBC:  Recent Labs Lab 07/02/16 1440 07/03/16 0241 07/04/16 0338 07/05/16 0255 07/07/16 0549  WBC 13.2* 17.2*  31.3* 28.1* 22.5*  HGB 12.8* 12.9* 14.1 13.9 13.2  HCT 40.3 40.3 42.8 42.8 41.4  MCV 97.1 97.1 94.9 95.5 95.8  PLT 237 221 286 278 247   Basic Metabolic Panel:  Recent Labs Lab 07/02/16 1440 07/03/16 0241 07/04/16 0338 07/05/16 0255 07/07/16 0549  NA 141 139 139 138 139  K 3.6 3.5 3.4* 3.5 3.2*  CL 107 104 98* 98* 94*  CO2 27 25 29 29  36*  GLUCOSE 104* 177* 137* 209* 217*  BUN 12 12 17  24* 24*  CREATININE 0.95 1.05 1.00 1.10 1.07  CALCIUM 8.6* 9.0 9.6 9.1 9.0  MG  --   --   --  2.3  --    Liver Function Tests:  Recent Labs Lab 07/03/16 0241  AST 18  ALT 10*  ALKPHOS 48  BILITOT 0.9  PROT 7.1  ALBUMIN 3.6   Cardiac Enzymes:  Recent Labs Lab 07/02/16 2149 07/03/16 0241 07/03/16 1226  TROPONINI <0.03  <0.03 <0.03   CBG:  Recent Labs Lab 07/03/16 0648  GLUCAP 147*    Recent Results (from the past 240 hour(s))  Culture, blood (routine x 2) Call MD if unable to obtain prior to antibiotics being given     Status: None (Preliminary result)   Collection Time: 07/02/16 10:00 PM  Result Value Ref Range Status   Specimen Description BLOOD LEFT ANTECUBITAL  Final   Special Requests BOTTLES DRAWN AEROBIC AND ANAEROBIC 5CC  Final   Culture NO GROWTH 4 DAYS  Final   Report Status PENDING  Incomplete  Culture, blood (routine x 2) Call MD if unable to obtain prior to antibiotics being given     Status: None (Preliminary result)   Collection Time: 07/02/16 10:10 PM  Result Value Ref Range Status   Specimen Description BLOOD RIGHT HAND  Final   Special Requests BOTTLES DRAWN AEROBIC AND ANAEROBIC 4CC  Final   Culture NO GROWTH 4 DAYS  Final   Report Status PENDING  Incomplete  MRSA PCR Screening     Status: None   Collection Time: 07/02/16 11:45 PM  Result Value Ref Range Status   MRSA by PCR NEGATIVE NEGATIVE Final    Comment:        The GeneXpert MRSA Assay (FDA approved for NASAL specimens only), is one component of a comprehensive MRSA colonization surveillance program. It is not intended to diagnose MRSA infection nor to guide or monitor treatment for MRSA infections.       Anti-infectives    Start     Dose/Rate Route Frequency Ordered Stop   07/05/16 1000  cephALEXin (KEFLEX) capsule 500 mg     500 mg Oral Every 12 hours 07/05/16 0924     07/03/16 1800  cefTRIAXone (ROCEPHIN) 1 g in dextrose 5 % 50 mL IVPB  Status:  Discontinued     1 g 100 mL/hr over 30 Minutes Intravenous Every 24 hours 07/02/16 2108 07/02/16 2117   07/03/16 1800  cefTRIAXone (ROCEPHIN) 2 g in dextrose 5 % 50 mL IVPB  Status:  Discontinued     2 g 100 mL/hr over 30 Minutes Intravenous Every 24 hours 07/02/16 2117 07/05/16 0924   07/03/16 1600  azithromycin (ZITHROMAX) tablet 500 mg  Status:   Discontinued     500 mg Oral Every 24 hours 07/02/16 2108 07/05/16 0924   07/02/16 1800  cefTRIAXone (ROCEPHIN) 1 g in dextrose 5 % 50 mL IVPB     1 g 100 mL/hr over 30 Minutes Intravenous  Once 07/02/16 1754 07/02/16 2012   07/02/16 1545  azithromycin (ZITHROMAX) 500 mg in dextrose 5 % 250 mL IVPB     500 mg 250 mL/hr over 60 Minutes Intravenous  Once 07/02/16 1542 07/02/16 1700      Radiology Studies: No results found.  Scheduled Meds: . amLODipine  10 mg Oral Daily  . cephALEXin  500 mg Oral Q12H  . guaiFENesin  600 mg Oral BID  . hydrALAZINE  25 mg Oral Q8H  . hydrochlorothiazide  25 mg Oral Daily  . ipratropium-albuterol  3 mL Nebulization TID  . methylPREDNISolone (SOLU-MEDROL) injection  40 mg Intravenous Daily  . mometasone-formoterol  2 puff Inhalation BID  . polyethylene glycol  17 g Oral Daily   Continuous Infusions:    LOS: 4 days   Time spent: 35 min  Debbora Presto, DO [facility] Pager [phone]  If 7PM-7AM, please contact night-coverage [email or web address] Password [email or web address] 07/07/2016, 10:38 AM

## 2016-07-07 NOTE — Progress Notes (Signed)
Ambulate patient in hallway per order, oxygen saturation 92-93%/RA. Pt. C/o of shortness of breath and he was wheezes with non productive cough. No dizziness. Will continue to monitor the patient.

## 2016-07-08 ENCOUNTER — Inpatient Hospital Stay (HOSPITAL_COMMUNITY)

## 2016-07-08 LAB — BASIC METABOLIC PANEL
Anion gap: 9 (ref 5–15)
BUN: 24 mg/dL — AB (ref 6–20)
CALCIUM: 9.4 mg/dL (ref 8.9–10.3)
CO2: 35 mmol/L — AB (ref 22–32)
Chloride: 95 mmol/L — ABNORMAL LOW (ref 101–111)
Creatinine, Ser: 1.06 mg/dL (ref 0.61–1.24)
GFR calc Af Amer: >60 mL/min (ref >60)
GLUCOSE: 194 mg/dL — AB (ref 65–99)
Potassium: 3.9 mmol/L (ref 3.5–5.1)
Sodium: 139 mmol/L (ref 135–145)

## 2016-07-08 LAB — CBC
HCT: 42.3 % (ref 39.0–52.0)
Hemoglobin: 13.5 g/dL (ref 13.0–17.0)
MCH: 30.7 pg (ref 26.0–34.0)
MCHC: 31.9 g/dL (ref 30.0–36.0)
MCV: 96.1 fL (ref 78.0–100.0)
PLATELETS: 249 10*3/uL (ref 150–400)
RBC: 4.4 MIL/uL (ref 4.22–5.81)
RDW: 13.3 % (ref 11.5–15.5)
WBC: 21.1 10*3/uL — ABNORMAL HIGH (ref 4.0–10.5)

## 2016-07-08 MED ORDER — GUAIFENESIN-DM 100-10 MG/5ML PO SYRP
5.0000 mL | ORAL_SOLUTION | ORAL | Status: DC | PRN
  Administered 2016-07-10: 5 mL via ORAL
  Filled 2016-07-08: qty 5

## 2016-07-08 MED ORDER — IPRATROPIUM-ALBUTEROL 0.5-2.5 (3) MG/3ML IN SOLN
3.0000 mL | Freq: Four times a day (QID) | RESPIRATORY_TRACT | Status: DC
  Administered 2016-07-08 – 2016-07-11 (×12): 3 mL via RESPIRATORY_TRACT
  Filled 2016-07-08 (×13): qty 3

## 2016-07-08 NOTE — Progress Notes (Signed)
PROGRESS NOTE    [name]  ZOX:[id number] DOB: 05-10-1956 DOA: 07/02/2016 PCP: No PCP Per Patient   Brief Narrative:  60 y.o. gentleman with a history of chronic diastolic heart failure, HTN, HLD, diet controlled "borderline" DM, COPD, and asthma who presents to the ED for evaluation of progressive SOB x one week, chest tightness with increase wheezing and cough productive of yellow sputum x three days.  He is an inmate, and he reports that he has had sick contacts at his correctional facility.  No documented fever but he has had chills and sweats.  No nausea or vomiting.  No hemoptysis.  He has a had a sore throat and post-nasal drip.  He does not wear oxygen at baseline.  + strep pna-- slowly improving.  Assessment & Plan:   Strep PNA/COPD exacerbation - IV Rocephin and PO azithromycin per pneumonia protocol initially started, now transitioned to oral Cephalexin  - Blood cultures streptococcal antigens- positive - HIV non-reactive - pt overall improving but still with rather overwhelming cough and more rhonchi on exam as well as wheezing, not much better since yesterday - will continue solumedrol with no tapering yet until respiratory status improves - Scheduled duonebs q4h while awake (HOLD Spiriva) - Continue Symbicort (or formulary equivalent) - I suspect will be able to transition to Prednisone in am - will order CT chest to take better look at lungs as I am not quite sure why pt slow to improve   Atypical chest pain  - suspect related to his COPD exacerbation - d/c tele, chest pain resolved   - CE x 3 negative   HTN, essential  - Continue home doses of HCTZ and amlodipine - may need as needed coverage if SBP > 155  Hypokalemia - mild, supplemented and WNL this AM   Abdominal distention - KUB shows unremarkable gas pattern - miralax daily  DVT prophylaxis:  SCD's  Code Status: Full Code  Family Communication: None  Disposition Plan:  Possibly in 1- 2 days  depending on the respiratory status.  Subjective: Still with productive cough  Objective: Vitals:   07/07/16 1600 07/07/16 1925 07/08/16 0423 07/08/16 0939  BP: (!) 145/82 (!) 141/82 (!) 155/93   Pulse: 76 87 66   Resp:  20 20   Temp:  98.3 F (36.8 C) 98 F (36.7 C)   TempSrc:  Oral Oral   SpO2:  92% 93% 94%  Weight:      Height:        Intake/Output Summary (Last 24 hours) at 07/08/16 1051 Last data filed at 07/08/16 0953  Gross per 24 hour  Intake              680 ml  Output              900 ml  Net             -220 ml   Filed Weights   07/05/16 0449 07/06/16 0504 07/07/16 0438  Weight: 106.7 kg (235 lb 3.2 oz) 107.6 kg (237 lb 3.2 oz) 108.1 kg (238 lb 6.4 oz)    Examination:  General exam: Appears calm and comfortable  Respiratory system: more rhonchi at bases right side worse then left, exp wheezing also noted  Cardiovascular system: S1 & S2 heard, RRR. No JVD, murmurs, rubs, gallops or clicks. No pedal edema. Gastrointestinal system: Abdomen is nondistended, soft and nontender. No organomegaly or masses felt.  Central nervous system: Alert and oriented. No focal neurological deficits.  Data Reviewed: I have personally reviewed following labs and imaging studies  CBC:  Recent Labs Lab 07/03/16 0241 07/04/16 0338 07/05/16 0255 07/07/16 0549 07/08/16 0439  WBC 17.2* 31.3* 28.1* 22.5* 21.1*  HGB 12.9* 14.1 13.9 13.2 13.5  HCT 40.3 42.8 42.8 41.4 42.3  MCV 97.1 94.9 95.5 95.8 96.1  PLT 221 286 278 247 249   Basic Metabolic Panel:  Recent Labs Lab 07/03/16 0241 07/04/16 0338 07/05/16 0255 07/07/16 0549 07/08/16 0439  NA 139 139 138 139 139  K 3.5 3.4* 3.5 3.2* 3.9  CL 104 98* 98* 94* 95*  CO2 25 29 29  36* 35*  GLUCOSE 177* 137* 209* 217* 194*  BUN 12 17 24* 24* 24*  CREATININE 1.05 1.00 1.10 1.07 1.06  CALCIUM 9.0 9.6 9.1 9.0 9.4  MG  --   --  2.3  --   --    Liver Function Tests:  Recent Labs Lab 07/03/16 0241  AST 18  ALT 10*    ALKPHOS 48  BILITOT 0.9  PROT 7.1  ALBUMIN 3.6   Cardiac Enzymes:  Recent Labs Lab 07/02/16 2149 07/03/16 0241 07/03/16 1226  TROPONINI <0.03 <0.03 <0.03   CBG:  Recent Labs Lab 07/03/16 0648  GLUCAP 147*    Recent Results (from the past 240 hour(s))  Culture, blood (routine x 2) Call MD if unable to obtain prior to antibiotics being given     Status: None   Collection Time: 07/02/16 10:00 PM  Result Value Ref Range Status   Specimen Description BLOOD LEFT ANTECUBITAL  Final   Special Requests BOTTLES DRAWN AEROBIC AND ANAEROBIC 5CC  Final   Culture NO GROWTH 5 DAYS  Final   Report Status 07/07/2016 FINAL  Final  Culture, blood (routine x 2) Call MD if unable to obtain prior to antibiotics being given     Status: None   Collection Time: 07/02/16 10:10 PM  Result Value Ref Range Status   Specimen Description BLOOD RIGHT HAND  Final   Special Requests BOTTLES DRAWN AEROBIC AND ANAEROBIC 4CC  Final   Culture NO GROWTH 5 DAYS  Final   Report Status 07/07/2016 FINAL  Final  MRSA PCR Screening     Status: None   Collection Time: 07/02/16 11:45 PM  Result Value Ref Range Status   MRSA by PCR NEGATIVE NEGATIVE Final    Comment:        The GeneXpert MRSA Assay (FDA approved for NASAL specimens only), is one component of a comprehensive MRSA colonization surveillance program. It is not intended to diagnose MRSA infection nor to guide or monitor treatment for MRSA infections.       Anti-infectives    Start     Dose/Rate Route Frequency Ordered Stop   07/05/16 1000  cephALEXin (KEFLEX) capsule 500 mg     500 mg Oral Every 12 hours 07/05/16 0924     07/03/16 1800  cefTRIAXone (ROCEPHIN) 1 g in dextrose 5 % 50 mL IVPB  Status:  Discontinued     1 g 100 mL/hr over 30 Minutes Intravenous Every 24 hours 07/02/16 2108 07/02/16 2117   07/03/16 1800  cefTRIAXone (ROCEPHIN) 2 g in dextrose 5 % 50 mL IVPB  Status:  Discontinued     2 g 100 mL/hr over 30 Minutes Intravenous  Every 24 hours 07/02/16 2117 07/05/16 0924   07/03/16 1600  azithromycin (ZITHROMAX) tablet 500 mg  Status:  Discontinued     500 mg Oral Every 24 hours 07/02/16 2108  07/05/16 0924   07/02/16 1800  cefTRIAXone (ROCEPHIN) 1 g in dextrose 5 % 50 mL IVPB     1 g 100 mL/hr over 30 Minutes Intravenous  Once 07/02/16 1754 07/02/16 2012   07/02/16 1545  azithromycin (ZITHROMAX) 500 mg in dextrose 5 % 250 mL IVPB     500 mg 250 mL/hr over 60 Minutes Intravenous  Once 07/02/16 1542 07/02/16 1700      Radiology Studies: No results found.  Scheduled Meds: . amLODipine  10 mg Oral Daily  . cephALEXin  500 mg Oral Q12H  . guaiFENesin  600 mg Oral BID  . hydrALAZINE  25 mg Oral Q8H  . hydrochlorothiazide  25 mg Oral Daily  . ipratropium-albuterol  3 mL Nebulization TID  . methylPREDNISolone (SOLU-MEDROL) injection  40 mg Intravenous Daily  . mometasone-formoterol  2 puff Inhalation BID  . polyethylene glycol  17 g Oral Daily   Continuous Infusions:    LOS: 5 days   Time spent: 35 min  Debbora PrestoMAGICK-Jassmin Kemmerer, DO [facility] Pager [phone]  If 7PM-7AM, please contact night-coverage [email or web address] Password [email or web address] 07/08/2016, 10:51 AM

## 2016-07-08 NOTE — Progress Notes (Signed)
Patient refused bed alarm. Will continue to monitor patient. 

## 2016-07-09 LAB — BASIC METABOLIC PANEL
Anion gap: 11 (ref 5–15)
BUN: 27 mg/dL — AB (ref 6–20)
CO2: 30 mmol/L (ref 22–32)
CREATININE: 1.06 mg/dL (ref 0.61–1.24)
Calcium: 9.4 mg/dL (ref 8.9–10.3)
Chloride: 95 mmol/L — ABNORMAL LOW (ref 101–111)
GFR calc Af Amer: >60 mL/min (ref >60)
GLUCOSE: 239 mg/dL — AB (ref 65–99)
POTASSIUM: 4.2 mmol/L (ref 3.5–5.1)
SODIUM: 136 mmol/L (ref 135–145)

## 2016-07-09 LAB — GLUCOSE, CAPILLARY
GLUCOSE-CAPILLARY: 383 mg/dL — AB (ref 65–99)
Glucose-Capillary: 411 mg/dL — ABNORMAL HIGH (ref 65–99)

## 2016-07-09 LAB — GLUCOSE, RANDOM: Glucose, Bld: 451 mg/dL — ABNORMAL HIGH (ref 65–99)

## 2016-07-09 LAB — CBC
HCT: 43.4 % (ref 39.0–52.0)
Hemoglobin: 13.7 g/dL (ref 13.0–17.0)
MCH: 30.4 pg (ref 26.0–34.0)
MCHC: 31.6 g/dL (ref 30.0–36.0)
MCV: 96.4 fL (ref 78.0–100.0)
PLATELETS: 257 10*3/uL (ref 150–400)
RBC: 4.5 MIL/uL (ref 4.22–5.81)
RDW: 13.2 % (ref 11.5–15.5)
WBC: 21.4 10*3/uL — ABNORMAL HIGH (ref 4.0–10.5)

## 2016-07-09 MED ORDER — INSULIN ASPART 100 UNIT/ML ~~LOC~~ SOLN
0.0000 [IU] | Freq: Three times a day (TID) | SUBCUTANEOUS | Status: DC
  Administered 2016-07-10: 2 [IU] via SUBCUTANEOUS
  Administered 2016-07-10: 9 [IU] via SUBCUTANEOUS
  Administered 2016-07-11: 5 [IU] via SUBCUTANEOUS
  Administered 2016-07-11: 7 [IU] via SUBCUTANEOUS

## 2016-07-09 MED ORDER — PREDNISONE 50 MG PO TABS
50.0000 mg | ORAL_TABLET | Freq: Every day | ORAL | Status: DC
  Administered 2016-07-10: 50 mg via ORAL
  Filled 2016-07-09: qty 1

## 2016-07-09 MED ORDER — INSULIN ASPART 100 UNIT/ML ~~LOC~~ SOLN
8.0000 [IU] | Freq: Once | SUBCUTANEOUS | Status: AC
  Administered 2016-07-09: 8 [IU] via SUBCUTANEOUS

## 2016-07-09 NOTE — Progress Notes (Signed)
PROGRESS NOTE    [name]  RUE:[id number] DOB: [birth year] DOA: 07/02/2016 PCP: No PCP Per Patient   Brief Narrative:  60 y.o. gentleman with a history of chronic diastolic heart failure, HTN, HLD, diet controlled "borderline" DM, COPD, and asthma who presents to the ED for evaluation of progressive SOB x one week, chest tightness with increase wheezing and cough productive of yellow sputum x three days.  He is an inmate, and he reports that he has had sick contacts at his correctional facility.  No documented fever but he has had chills and sweats.  No nausea or vomiting.  No hemoptysis.  He has a had a sore throat and post-nasal drip.  He does not wear oxygen at baseline.  + strep pna-- slowly improving.  Assessment & Plan:   Strep PNA/COPD exacerbation - IV Rocephin and PO azithromycin per pneumonia protocol initially started, now transitioned to oral Cephalexin  - Blood cultures streptococcal antigens- positive - HIV non-reactive - pt overall improving but still with rather overwhelming cough and more rhonchi on exam as well as wheezing, not much better since yesterday - will try to place on Prednisone and see how pt does in AM  - Scheduled duonebs q4h while awake (HOLD Spiriva) - Continue Symbicort (or formulary equivalent)   Atypical chest pain  - suspect related to his COPD exacerbation - d/c tele, chest pain resolved   - CE x 3 negative   HTN, essential  - Continue home doses of HCTZ and amlodipine - may need as needed coverage if SBP > 155  Ascending thoracic aortic prominence.  - Measured ascending thoracic aortic diameter is 4.2 x 4.1 cm.  - Recommend annual imaging followup by CTA or MRA.   Hypokalemia - mild, supplemented and WNL this AM   Abdominal distention - KUB shows unremarkable gas pattern - miralax daily  DVT prophylaxis:  SCD's  Code Status: Full Code  Family Communication: None  Disposition Plan:  Possibly in 1- 2 days depending on the  respiratory status.  Subjective: Still with productive cough  Objective: Vitals:   07/08/16 1552 07/08/16 2228 07/09/16 0545 07/09/16 0843  BP: (!) 150/89 (!) 157/79 (!) 150/88   Pulse:  92 79   Resp:  18 18   Temp:  98.5 F (36.9 C) 97.9 F (36.6 C)   TempSrc:  Oral Oral   SpO2:  95% (!) 86% 95%  Weight:   105.7 kg (233 lb)   Height:        Intake/Output Summary (Last 24 hours) at 07/09/16 1147 Last data filed at 07/09/16 0840  Gross per 24 hour  Intake              700 ml  Output              500 ml  Net              200 ml   Filed Weights   07/06/16 0504 07/07/16 0438 07/09/16 0545  Weight: 107.6 kg (237 lb 3.2 oz) 108.1 kg (238 lb 6.4 oz) 105.7 kg (233 lb)    Examination:  General exam: Appears calm and comfortable  Respiratory system: more rhonchi at bases right side worse then left, exp wheezing also noted  Cardiovascular system: S1 & S2 heard, RRR. No JVD, murmurs, rubs, gallops or clicks. No pedal edema. Gastrointestinal system: Abdomen is nondistended, soft and nontender. No organomegaly or masses felt.  Central nervous system: Alert and oriented. No focal neurological deficits.  Data Reviewed: I have personally reviewed following labs and imaging studies  CBC:  Recent Labs Lab 07/04/16 0338 07/05/16 0255 07/07/16 0549 07/08/16 0439 07/09/16 0318  WBC 31.3* 28.1* 22.5* 21.1* 21.4*  HGB 14.1 13.9 13.2 13.5 13.7  HCT 42.8 42.8 41.4 42.3 43.4  MCV 94.9 95.5 95.8 96.1 96.4  PLT 286 278 247 249 257   Basic Metabolic Panel:  Recent Labs Lab 07/04/16 0338 07/05/16 0255 07/07/16 0549 07/08/16 0439 07/09/16 0318  NA 139 138 139 139 136  K 3.4* 3.5 3.2* 3.9 4.2  CL 98* 98* 94* 95* 95*  CO2 29 29 36* 35* 30  GLUCOSE 137* 209* 217* 194* 239*  BUN 17 24* 24* 24* 27*  CREATININE 1.00 1.10 1.07 1.06 1.06  CALCIUM 9.6 9.1 9.0 9.4 9.4  MG  --  2.3  --   --   --    Liver Function Tests:  Recent Labs Lab 07/03/16 0241  AST 18  ALT 10*    ALKPHOS 48  BILITOT 0.9  PROT 7.1  ALBUMIN 3.6   Cardiac Enzymes:  Recent Labs Lab 07/02/16 2149 07/03/16 0241 07/03/16 1226  TROPONINI <0.03 <0.03 <0.03   CBG:  Recent Labs Lab 07/03/16 0648  GLUCAP 147*    Recent Results (from the past 240 hour(s))  Culture, blood (routine x 2) Call MD if unable to obtain prior to antibiotics being given     Status: None   Collection Time: 07/02/16 10:00 PM  Result Value Ref Range Status   Specimen Description BLOOD LEFT ANTECUBITAL  Final   Special Requests BOTTLES DRAWN AEROBIC AND ANAEROBIC 5CC  Final   Culture NO GROWTH 5 DAYS  Final   Report Status 07/07/2016 FINAL  Final  Culture, blood (routine x 2) Call MD if unable to obtain prior to antibiotics being given     Status: None   Collection Time: 07/02/16 10:10 PM  Result Value Ref Range Status   Specimen Description BLOOD RIGHT HAND  Final   Special Requests BOTTLES DRAWN AEROBIC AND ANAEROBIC 4CC  Final   Culture NO GROWTH 5 DAYS  Final   Report Status 07/07/2016 FINAL  Final  MRSA PCR Screening     Status: None   Collection Time: 07/02/16 11:45 PM  Result Value Ref Range Status   MRSA by PCR NEGATIVE NEGATIVE Final    Comment:        The GeneXpert MRSA Assay (FDA approved for NASAL specimens only), is one component of a comprehensive MRSA colonization surveillance program. It is not intended to diagnose MRSA infection nor to guide or monitor treatment for MRSA infections.       Anti-infectives    Start     Dose/Rate Route Frequency Ordered Stop   07/05/16 1000  cephALEXin (KEFLEX) capsule 500 mg     500 mg Oral Every 12 hours 07/05/16 0924     07/03/16 1800  cefTRIAXone (ROCEPHIN) 1 g in dextrose 5 % 50 mL IVPB  Status:  Discontinued     1 g 100 mL/hr over 30 Minutes Intravenous Every 24 hours 07/02/16 2108 07/02/16 2117   07/03/16 1800  cefTRIAXone (ROCEPHIN) 2 g in dextrose 5 % 50 mL IVPB  Status:  Discontinued     2 g 100 mL/hr over 30 Minutes Intravenous  Every 24 hours 07/02/16 2117 07/05/16 0924   07/03/16 1600  azithromycin (ZITHROMAX) tablet 500 mg  Status:  Discontinued     500 mg Oral Every 24 hours 07/02/16 2108  07/05/16 0924   07/02/16 1800  cefTRIAXone (ROCEPHIN) 1 g in dextrose 5 % 50 mL IVPB     1 g 100 mL/hr over 30 Minutes Intravenous  Once 07/02/16 1754 07/02/16 2012   07/02/16 1545  azithromycin (ZITHROMAX) 500 mg in dextrose 5 % 250 mL IVPB     500 mg 250 mL/hr over 60 Minutes Intravenous  Once 07/02/16 1542 07/02/16 1700      Radiology Studies: Ct Chest Wo Contrast  Result Date: 07/09/2016 CLINICAL DATA:  Shortness of Breath EXAM: CT CHEST WITHOUT CONTRAST TECHNIQUE: Multidetector CT imaging of the chest was performed following the standard protocol without IV contrast. COMPARISON:  Chest radiograph July 03, 2016 FINDINGS: Cardiovascular: Ascending thoracic aorta has a measured diameter of 4.2 x 4.1 cm. Visualized great vessels appear unremarkable. The pericardium is not appreciably thickened. Mediastinum/Nodes: Thyroid appears unremarkable. There is no appreciable thoracic adenopathy. Lungs/Pleura: There is atelectatic change in the right base with elevation of the right hemidiaphragm. There is no parenchymal lung edema or consolidation evident. There is no appreciable pleural effusion or pleural thickening. Upper Abdomen: Visualized upper abdominal structures appear unremarkable. Musculoskeletal: There is a shunt catheter extending along the anterior right hemithorax. There is degenerative change in the lower thoracic spine. There are no blastic or lytic bone lesions evident. IMPRESSION: Ascending thoracic aortic prominence. Measured ascending thoracic aortic diameter is 4.2 x 4.1 cm. Recommend annual imaging followup by CTA or MRA. This recommendation follows 2010 ACCF/AHA/AATS/ACR/ASA/SCA/SCAI/SIR/STS/SVM Guidelines for the Diagnosis and Management of Patients with Thoracic Aortic Disease. Circulation. 2010; 121: Z610-R604.  Elevated right hemidiaphragm with right base atelectasis. No lung edema or consolidation. No adenopathy. Electronically Signed   By: Bretta Bang III M.D.   On: 07/09/2016 07:07    Scheduled Meds: . amLODipine  10 mg Oral Daily  . cephALEXin  500 mg Oral Q12H  . guaiFENesin  600 mg Oral BID  . hydrALAZINE  25 mg Oral Q8H  . hydrochlorothiazide  25 mg Oral Daily  . insulin aspart  0-9 Units Subcutaneous TID WC  . ipratropium-albuterol  3 mL Nebulization Q6H  . methylPREDNISolone (SOLU-MEDROL) injection  40 mg Intravenous Daily  . mometasone-formoterol  2 puff Inhalation BID  . polyethylene glycol  17 g Oral Daily   Continuous Infusions:    LOS: 6 days   Time spent: 35 min  Debbora Presto, DO [facility] Pager [phone]  If 7PM-7AM, please contact night-coverage [email or web address] Password [email or web address] 07/09/2016, 11:47 AM

## 2016-07-09 NOTE — Progress Notes (Signed)
Inpatient Diabetes Program Recommendations  AACE/ADA: New Consensus Statement on Inpatient Glycemic Control (2015)  Target Ranges:  Prepandial:   less than 140 mg/dL      Peak postprandial:   less than 180 mg/dL (1-2 hours)      Critically ill patients:  140 - 180 mg/dL   Results for [name], Jonas (MRN [id number]) as of 07/09/2016 10:34  Ref. Range 07/09/2016 03:18  Glucose Latest Ref Range: 65 - 99 mg/dL 409239 (H)   Lab Results  Component Value Date   GLUCAP 147 (H) 07/03/2016   HGBA1C 5.4 03/08/2015    Review of Glycemic Control  Diabetes history: DM2, on steroids Outpatient Diabetes medications: None Current orders for Inpatient glycemic control: None  Inpatient Diabetes Program Recommendations:  Per ADA recommendations "consider performing an A1C on all patients with diabetes or hyperglycemia admitted to the hospital if not performed in the prior 3 months".  Please consider Novolog 0-9 units TIDAC and 0-5 units QHS.  Thank you,  Kristine LineaKaren Michele Kerlin, RN, BSN Diabetes Coordinator Inpatient Diabetes Program [phone] (Team Pager) [phone] ([facility] office) [phone] [facility] office) [phone] [facility] office)

## 2016-07-09 NOTE — Progress Notes (Signed)
Patient's blood glucose 411, stat verification ordered from lab, awaiting callback from MD Gwinda PasseMyers Haydan Mansouri, Myrtie HawkKennitrish N RN 6:20 PM 07-09-2016

## 2016-07-10 LAB — GLUCOSE, CAPILLARY
Glucose-Capillary: 169 mg/dL — ABNORMAL HIGH (ref 65–99)
Glucose-Capillary: 263 mg/dL — ABNORMAL HIGH (ref 65–99)
Glucose-Capillary: 363 mg/dL — ABNORMAL HIGH (ref 65–99)
Glucose-Capillary: 366 mg/dL — ABNORMAL HIGH (ref 65–99)

## 2016-07-10 LAB — HEMOGLOBIN A1C
Hgb A1c MFr Bld: 5.9 % — ABNORMAL HIGH (ref 4.8–5.6)
MEAN PLASMA GLUCOSE: 123 mg/dL

## 2016-07-10 MED ORDER — METHYLPREDNISOLONE SODIUM SUCC 125 MG IJ SOLR
60.0000 mg | Freq: Two times a day (BID) | INTRAMUSCULAR | Status: DC
  Administered 2016-07-10 (×2): 60 mg via INTRAVENOUS
  Filled 2016-07-10 (×2): qty 2

## 2016-07-10 MED ORDER — CEPHALEXIN 500 MG PO CAPS
500.0000 mg | ORAL_CAPSULE | Freq: Two times a day (BID) | ORAL | Status: AC
  Administered 2016-07-10: 500 mg via ORAL
  Filled 2016-07-10: qty 1

## 2016-07-10 NOTE — Progress Notes (Addendum)
PROGRESS NOTE    [name]  XBJ:[id number] DOB: [birth year] DOA: 07/02/2016 PCP: No PCP Per Patient   Brief Narrative:  60 y.o. gentleman with a history of chronic diastolic heart failure, HTN, HLD, diet controlled "borderline" DM, COPD, and asthma who presents to the ED for evaluation of progressive SOB x one week, chest tightness with increase wheezing and cough productive of yellow sputum x three days.  He is an inmate, and he reports that he has had sick contacts at his correctional facility.  No documented fever but he has had chills and sweats.  No nausea or vomiting.  No hemoptysis.  He has a had a sore throat and post-nasal drip.  He does not wear oxygen at baseline.  + strep pna-- slowly improving.  Assessment & Plan:   Strep PNA/Acute COPD exacerbation - IV Rocephin and PO azithromycin per pneumonia protocol initially started, now transitioned to oral Cephalexin, will stop after today, this will complete 8 days therapy  - Blood cultures streptococcal antigens- positive - HIV non-reactive - pt overall improving but still with rather overwhelming cough and more rhonchi on exam, worsening wheezing this AM as well - was initially on Solumedrol and tapered down to Prednisone 10/16 but now again with more wheezing - will try one more day of solumedrol and if pt better in AM, can be placed on Prednisone in AM with slow taper course  - Scheduled duonebs q4h while awake (HOLD Spiriva) - Continue Symbicort (or formulary equivalent)   Atypical chest pain  - suspect related to his COPD exacerbation - d/c tele, chest pain resolved   - CE x 3 negative   HTN, essential  - Continue home doses of HCTZ and amlodipine - may need as needed coverage if SBP > 155  Ascending thoracic aortic prominence.  - Measured ascending thoracic aortic diameter is 4.2 x 4.1 cm.  - Recommend annual imaging followup by CTA or MRA.   Hypokalemia - mild, supplemented and WNL this AM   Abdominal  distention - KUB shows unremarkable gas pattern - miralax daily  Hyperglycemia - A1C 5.9, this is likely steroid induced - would not place on antihyperglycemic regimen upon discharge   Obesity  - Body mass index is 35.82 kg/m.   DVT prophylaxis:  SCD's  Code Status: Full Code  Family Communication: None  Disposition Plan:  Possibly in 1- 2 days depending on the respiratory status.and if able to take off Solumedrol   Subjective: Still with productive cough  Objective: Vitals:   07/09/16 2037 07/10/16 0501 07/10/16 0923 07/10/16 0924  BP:  (!) 144/93    Pulse:  77    Resp:  18    Temp:  98 F (36.7 C)    TempSrc:  Oral    SpO2: 97% 92% 93% 93%  Weight:  106.9 kg (235 lb 9.6 oz)    Height:        Intake/Output Summary (Last 24 hours) at 07/10/16 1116 Last data filed at 07/10/16 0951  Gross per 24 hour  Intake             1320 ml  Output              200 ml  Net             1120 ml   Filed Weights   07/07/16 0438 07/09/16 0545 07/10/16 0501  Weight: 108.1 kg (238 lb 6.4 oz) 105.7 kg (233 lb) 106.9 kg (235 lb 9.6 oz)  Examination:  General exam: Appears calm and comfortable  Respiratory system: more rhonchi at bases right side worse then left, more exp wheezing noted again this AM Cardiovascular system: S1 & S2 heard, RRR. No JVD, murmurs, rubs, gallops or clicks. No pedal edema. Gastrointestinal system: Abdomen is nondistended, soft and nontender. No organomegaly or masses felt.  Central nervous system: Alert and oriented. No focal neurological deficits.   Data Reviewed: I have personally reviewed following labs and imaging studies  CBC:  Recent Labs Lab 07/04/16 0338 07/05/16 0255 07/07/16 0549 07/08/16 0439 07/09/16 0318  WBC 31.3* 28.1* 22.5* 21.1* 21.4*  HGB 14.1 13.9 13.2 13.5 13.7  HCT 42.8 42.8 41.4 42.3 43.4  MCV 94.9 95.5 95.8 96.1 96.4  PLT 286 278 247 249 257   Basic Metabolic Panel:  Recent Labs Lab 07/04/16 0338  07/05/16 0255 07/07/16 0549 07/08/16 0439 07/09/16 0318 07/09/16 1819  NA 139 138 139 139 136  --   K 3.4* 3.5 3.2* 3.9 4.2  --   CL 98* 98* 94* 95* 95*  --   CO2 29 29 36* 35* 30  --   GLUCOSE 137* 209* 217* 194* 239* 451*  BUN 17 24* 24* 24* 27*  --   CREATININE 1.00 1.10 1.07 1.06 1.06  --   CALCIUM 9.6 9.1 9.0 9.4 9.4  --   MG  --  2.3  --   --   --   --    Cardiac Enzymes:  Recent Labs Lab 07/03/16 1226  TROPONINI <0.03   CBG:  Recent Labs Lab 07/09/16 1806 07/09/16 2148 07/10/16 0037 07/10/16 0558  GLUCAP 411* 383* 263* 169*    Recent Results (from the past 240 hour(s))  Culture, blood (routine x 2) Call MD if unable to obtain prior to antibiotics being given     Status: None   Collection Time: 07/02/16 10:00 PM  Result Value Ref Range Status   Specimen Description BLOOD LEFT ANTECUBITAL  Final   Special Requests BOTTLES DRAWN AEROBIC AND ANAEROBIC 5CC  Final   Culture NO GROWTH 5 DAYS  Final   Report Status 07/07/2016 FINAL  Final  Culture, blood (routine x 2) Call MD if unable to obtain prior to antibiotics being given     Status: None   Collection Time: 07/02/16 10:10 PM  Result Value Ref Range Status   Specimen Description BLOOD RIGHT HAND  Final   Special Requests BOTTLES DRAWN AEROBIC AND ANAEROBIC 4CC  Final   Culture NO GROWTH 5 DAYS  Final   Report Status 07/07/2016 FINAL  Final  MRSA PCR Screening     Status: None   Collection Time: 07/02/16 11:45 PM  Result Value Ref Range Status   MRSA by PCR NEGATIVE NEGATIVE Final    Comment:        The GeneXpert MRSA Assay (FDA approved for NASAL specimens only), is one component of a comprehensive MRSA colonization surveillance program. It is not intended to diagnose MRSA infection nor to guide or monitor treatment for MRSA infections.       Anti-infectives    Start     Dose/Rate Route Frequency Ordered Stop   07/05/16 1000  cephALEXin (KEFLEX) capsule 500 mg     500 mg Oral Every 12 hours  07/05/16 0924     07/03/16 1800  cefTRIAXone (ROCEPHIN) 1 g in dextrose 5 % 50 mL IVPB  Status:  Discontinued     1 g 100 mL/hr over 30 Minutes Intravenous Every 24  hours 07/02/16 2108 07/02/16 2117   07/03/16 1800  cefTRIAXone (ROCEPHIN) 2 g in dextrose 5 % 50 mL IVPB  Status:  Discontinued     2 g 100 mL/hr over 30 Minutes Intravenous Every 24 hours 07/02/16 2117 07/05/16 0924   07/03/16 1600  azithromycin (ZITHROMAX) tablet 500 mg  Status:  Discontinued     500 mg Oral Every 24 hours 07/02/16 2108 07/05/16 0924   07/02/16 1800  cefTRIAXone (ROCEPHIN) 1 g in dextrose 5 % 50 mL IVPB     1 g 100 mL/hr over 30 Minutes Intravenous  Once 07/02/16 1754 07/02/16 2012   07/02/16 1545  azithromycin (ZITHROMAX) 500 mg in dextrose 5 % 250 mL IVPB     500 mg 250 mL/hr over 60 Minutes Intravenous  Once 07/02/16 1542 07/02/16 1700      Radiology Studies: Ct Chest Wo Contrast  Result Date: 07/09/2016 CLINICAL DATA:  Shortness of Breath EXAM: CT CHEST WITHOUT CONTRAST TECHNIQUE: Multidetector CT imaging of the chest was performed following the standard protocol without IV contrast. COMPARISON:  Chest radiograph July 03, 2016 FINDINGS: Cardiovascular: Ascending thoracic aorta has a measured diameter of 4.2 x 4.1 cm. Visualized great vessels appear unremarkable. The pericardium is not appreciably thickened. Mediastinum/Nodes: Thyroid appears unremarkable. There is no appreciable thoracic adenopathy. Lungs/Pleura: There is atelectatic change in the right base with elevation of the right hemidiaphragm. There is no parenchymal lung edema or consolidation evident. There is no appreciable pleural effusion or pleural thickening. Upper Abdomen: Visualized upper abdominal structures appear unremarkable. Musculoskeletal: There is a shunt catheter extending along the anterior right hemithorax. There is degenerative change in the lower thoracic spine. There are no blastic or lytic bone lesions evident. IMPRESSION:  Ascending thoracic aortic prominence. Measured ascending thoracic aortic diameter is 4.2 x 4.1 cm. Recommend annual imaging followup by CTA or MRA. This recommendation follows 2010 ACCF/AHA/AATS/ACR/ASA/SCA/SCAI/SIR/STS/SVM Guidelines for the Diagnosis and Management of Patients with Thoracic Aortic Disease. Circulation. 2010; 121: Z610-R604. Elevated right hemidiaphragm with right base atelectasis. No lung edema or consolidation. No adenopathy. Electronically Signed   By: Bretta Bang III M.D.   On: 07/09/2016 07:07    Scheduled Meds: . amLODipine  10 mg Oral Daily  . cephALEXin  500 mg Oral Q12H  . guaiFENesin  600 mg Oral BID  . hydrALAZINE  25 mg Oral Q8H  . hydrochlorothiazide  25 mg Oral Daily  . insulin aspart  0-9 Units Subcutaneous TID WC  . ipratropium-albuterol  3 mL Nebulization Q6H  . methylPREDNISolone (SOLU-MEDROL) injection  60 mg Intravenous Q12H  . mometasone-formoterol  2 puff Inhalation BID  . polyethylene glycol  17 g Oral Daily   Continuous Infusions:    LOS: 7 days   Time spent: 35 min  Debbora Presto, MD [facility] Pager [phone]  If 7PM-7AM, please contact night-coverage [email or web address] Password [email or web address] 07/10/2016, 11:16 AM

## 2016-07-10 NOTE — Progress Notes (Signed)
Patient alert and oriented with Wheezing and chest wall pain improving. On room air with no distress.

## 2016-07-11 DIAGNOSIS — J441 Chronic obstructive pulmonary disease with (acute) exacerbation: Secondary | ICD-10-CM

## 2016-07-11 DIAGNOSIS — J189 Pneumonia, unspecified organism: Secondary | ICD-10-CM

## 2016-07-11 DIAGNOSIS — D72828 Other elevated white blood cell count: Secondary | ICD-10-CM

## 2016-07-11 DIAGNOSIS — I10 Essential (primary) hypertension: Secondary | ICD-10-CM

## 2016-07-11 DIAGNOSIS — R140 Abdominal distension (gaseous): Secondary | ICD-10-CM

## 2016-07-11 DIAGNOSIS — J181 Lobar pneumonia, unspecified organism: Secondary | ICD-10-CM

## 2016-07-11 LAB — GLUCOSE, CAPILLARY
GLUCOSE-CAPILLARY: 325 mg/dL — AB (ref 65–99)
GLUCOSE-CAPILLARY: 471 mg/dL — AB (ref 65–99)
Glucose-Capillary: 296 mg/dL — ABNORMAL HIGH (ref 65–99)
Glucose-Capillary: 362 mg/dL — ABNORMAL HIGH (ref 65–99)

## 2016-07-11 LAB — CBC
HEMATOCRIT: 43.7 % (ref 39.0–52.0)
HEMOGLOBIN: 14.4 g/dL (ref 13.0–17.0)
MCH: 31.4 pg (ref 26.0–34.0)
MCHC: 33 g/dL (ref 30.0–36.0)
MCV: 95.2 fL (ref 78.0–100.0)
Platelets: 256 10*3/uL (ref 150–400)
RBC: 4.59 MIL/uL (ref 4.22–5.81)
RDW: 13.2 % (ref 11.5–15.5)
WBC: 23.9 10*3/uL — AB (ref 4.0–10.5)

## 2016-07-11 LAB — BASIC METABOLIC PANEL
ANION GAP: 10 (ref 5–15)
BUN: 31 mg/dL — ABNORMAL HIGH (ref 6–20)
CALCIUM: 9.6 mg/dL (ref 8.9–10.3)
CHLORIDE: 95 mmol/L — AB (ref 101–111)
CO2: 31 mmol/L (ref 22–32)
Creatinine, Ser: 1.31 mg/dL — ABNORMAL HIGH (ref 0.61–1.24)
GFR calc non Af Amer: 58 mL/min — ABNORMAL LOW (ref >60)
Glucose, Bld: 374 mg/dL — ABNORMAL HIGH (ref 65–99)
POTASSIUM: 4.4 mmol/L (ref 3.5–5.1)
Sodium: 136 mmol/L (ref 135–145)

## 2016-07-11 MED ORDER — INSULIN ASPART 100 UNIT/ML ~~LOC~~ SOLN
10.0000 [IU] | Freq: Once | SUBCUTANEOUS | Status: AC
  Administered 2016-07-11: 10 [IU] via SUBCUTANEOUS

## 2016-07-11 MED ORDER — PREDNISONE 50 MG PO TABS
60.0000 mg | ORAL_TABLET | Freq: Every day | ORAL | Status: DC
  Administered 2016-07-12: 60 mg via ORAL
  Filled 2016-07-11 (×2): qty 1

## 2016-07-11 MED ORDER — INSULIN ASPART 100 UNIT/ML ~~LOC~~ SOLN
0.0000 [IU] | Freq: Every day | SUBCUTANEOUS | Status: DC
  Administered 2016-07-11: 5 [IU] via SUBCUTANEOUS

## 2016-07-11 MED ORDER — INSULIN ASPART 100 UNIT/ML ~~LOC~~ SOLN
0.0000 [IU] | Freq: Three times a day (TID) | SUBCUTANEOUS | Status: DC
  Administered 2016-07-12: 7 [IU] via SUBCUTANEOUS
  Administered 2016-07-12: 11 [IU] via SUBCUTANEOUS

## 2016-07-11 MED ORDER — IPRATROPIUM-ALBUTEROL 0.5-2.5 (3) MG/3ML IN SOLN
3.0000 mL | Freq: Three times a day (TID) | RESPIRATORY_TRACT | Status: DC
  Administered 2016-07-12: 3 mL via RESPIRATORY_TRACT
  Filled 2016-07-11: qty 3

## 2016-07-11 NOTE — Progress Notes (Signed)
PROGRESS NOTE    [name]  WJX:[id number] DOB: [birth year] DOA: 07/02/2016 PCP: No PCP Per Patient   Chief Complaint  Patient presents with  . Chest Pain  . Shortness of Breath     Brief Narrative:  60 y.o.gentleman with a history of chronic diastolic heart failure, HTN, HLD, diet controlled "borderline" DM, COPD, and asthma who presents to the ED for evaluation of progressive SOB x one week, chest tightness with increase wheezing and cough productive of yellow sputum x three days. He is an inmate, and he reports that he has had sick contacts at his correctional facility. No documented fever but he has had chills and sweats. No nausea or vomiting. No hemoptysis. He has a had a sore throat and post-nasal drip. He does not wear oxygen at baseline.  + strep pna-- slowly improving. Patient was placed back on IV steroids due to increased wheezing and shortness of breath.  Will attempt to transition today to PO  Assessment & Plan   Strep PNA/Acute COPD exacerbation -IV Rocephin and PO azithromycin per pneumonia protocol initially started, transitioned to oral Cephalexin, completed 8 days of therapy  -Blood cultures streptococcal antigens- positive -HIV non-reactive -Was transitioned to PO prednisone, however developed increased wheezing on 10/17. -Will transition to PO prednisone today and continue to monitor.  -Continue scheduled duonebs q4h while awake (HOLD Spiriva) and Symbicort (or formulary equivalent)   Atypical chest pain  -suspect related to his COPD exacerbation -Chest pain resolved   -Cardiac enzymes x 3 negative   HTN, essential  -Continue home doses of HCTZ and amlodipine -may need PRN coverage if SBP > 155  Ascending thoracic aortic prominence.  -Measured ascending thoracic aortic diameter is 4.2 x 4.1 cm.  -Recommend annual imaging followup by CTA or MRA.   Hypokalemia -Resolved, continue to monitor BMP  Abdominal distention -KUB shows unremarkable  gas pattern -miralax daily  Hyperglycemia -hemoglobin A1C 5.9, this is likely steroid induced -would not place on antihyperglycemic regimen upon discharge   Obesity  -BMI 35.82  -Follow up with PCP to discuss lifestyle modifications, including diet and exercise.  DVT Prophylaxis  SCDs  Code Status: Full  Family Communication: None at bedside  Disposition Plan: Admitted. Possible discharge in 24-48 hours depending on respiratory status.  Consultants None  Procedures  None  Antibiotics   Anti-infectives    Start     Dose/Rate Route Frequency Ordered Stop   07/10/16 2200  cephALEXin (KEFLEX) capsule 500 mg     500 mg Oral Every 12 hours 07/10/16 1123 07/10/16 2200   07/05/16 1000  cephALEXin (KEFLEX) capsule 500 mg  Status:  Discontinued     500 mg Oral Every 12 hours 07/05/16 0924 07/10/16 1123   07/03/16 1800  cefTRIAXone (ROCEPHIN) 1 g in dextrose 5 % 50 mL IVPB  Status:  Discontinued     1 g 100 mL/hr over 30 Minutes Intravenous Every 24 hours 07/02/16 2108 07/02/16 2117   07/03/16 1800  cefTRIAXone (ROCEPHIN) 2 g in dextrose 5 % 50 mL IVPB  Status:  Discontinued     2 g 100 mL/hr over 30 Minutes Intravenous Every 24 hours 07/02/16 2117 07/05/16 0924   07/03/16 1600  azithromycin (ZITHROMAX) tablet 500 mg  Status:  Discontinued     500 mg Oral Every 24 hours 07/02/16 2108 07/05/16 0924   07/02/16 1800  cefTRIAXone (ROCEPHIN) 1 g in dextrose 5 % 50 mL IVPB     1 g 100 mL/hr over 30 Minutes  Intravenous  Once 07/02/16 1754 07/02/16 2012   07/02/16 1545  azithromycin (ZITHROMAX) 500 mg in dextrose 5 % 250 mL IVPB     500 mg 250 mL/hr over 60 Minutes Intravenous  Once 07/02/16 1542 07/02/16 1700      Subjective:   [name] seen and examined today.  Patient continues to have cough, but feels it is improving or "breaking up."  Continues to have shortness of breath and wheezing, but feels it is improving, but not back to his baseline.  Denies chest pain, abdominal  pain, nausea or vomiting, dizziness, headache.  Objective:   Vitals:   07/11/16 0246 07/11/16 0505 07/11/16 0920 07/11/16 1041  BP:  (!) 158/96  (!) 149/82  Pulse:  (!) 102    Resp:  19    Temp:  98.4 F (36.9 C)    TempSrc:  Oral    SpO2: 96% 91% 91%   Weight:  106.5 kg (234 lb 12.8 oz)    Height:        Intake/Output Summary (Last 24 hours) at 07/11/16 1048 Last data filed at 07/11/16 0900  Gross per 24 hour  Intake              660 ml  Output              300 ml  Net              360 ml   Filed Weights   07/09/16 0545 07/10/16 0501 07/11/16 0505  Weight: 105.7 kg (233 lb) 106.9 kg (235 lb 9.6 oz) 106.5 kg (234 lb 12.8 oz)    Exam  General: Well developed, well nourished, NAD, appears stated age  HEENT: NCAT,  mucous membranes moist.   Cardiovascular: S1 S2 auscultated, no rubs, murmurs or gallops. Regular rate and rhythm.  Respiratory: Diffuse exp wheezing with occ cough.  Abdomen: Soft, nontender, nondistended, + bowel sounds  Extremities: warm dry without cyanosis clubbing or edema  Neuro: AAOx3, nonfocal  Psych: Normal affect and demeanor with intact judgement and insight   Data Reviewed: I have personally reviewed following labs and imaging studies  CBC:  Recent Labs Lab 07/05/16 0255 07/07/16 0549 07/08/16 0439 07/09/16 0318 07/11/16 0350  WBC 28.1* 22.5* 21.1* 21.4* 23.9*  HGB 13.9 13.2 13.5 13.7 14.4  HCT 42.8 41.4 42.3 43.4 43.7  MCV 95.5 95.8 96.1 96.4 95.2  PLT 278 247 249 257 256   Basic Metabolic Panel:  Recent Labs Lab 07/05/16 0255 07/07/16 0549 07/08/16 0439 07/09/16 0318 07/09/16 1819 07/11/16 0350  NA 138 139 139 136  --  136  K 3.5 3.2* 3.9 4.2  --  4.4  CL 98* 94* 95* 95*  --  95*  CO2 29 36* 35* 30  --  31  GLUCOSE 209* 217* 194* 239* 451* 374*  BUN 24* 24* 24* 27*  --  31*  CREATININE 1.10 1.07 1.06 1.06  --  1.31*  CALCIUM 9.1 9.0 9.4 9.4  --  9.6  MG 2.3  --   --   --   --   --    GFR: Estimated Creatinine  Clearance: 71.8 mL/min (by C-G formula based on SCr of 1.31 mg/dL (H)). Liver Function Tests: No results for input(s): AST, ALT, ALKPHOS, BILITOT, PROT, ALBUMIN in the last 168 hours. No results for input(s): LIPASE, AMYLASE in the last 168 hours. No results for input(s): AMMONIA in the last 168 hours. Coagulation Profile: No results for input(s): INR,  PROTIME in the last 168 hours. Cardiac Enzymes: No results for input(s): CKTOTAL, CKMB, CKMBINDEX, TROPONINI in the last 168 hours. BNP (last 3 results) No results for input(s): PROBNP in the last 8760 hours. HbA1C:  Recent Labs  07/09/16 1309  HGBA1C 5.9*   CBG:  Recent Labs Lab 07/10/16 0037 07/10/16 0558 07/10/16 1722 07/10/16 2138 07/11/16 0539  GLUCAP 263* 169* 363* 366* 296*   Lipid Profile: No results for input(s): CHOL, HDL, LDLCALC, TRIG, CHOLHDL, LDLDIRECT in the last 72 hours. Thyroid Function Tests: No results for input(s): TSH, T4TOTAL, FREET4, T3FREE, THYROIDAB in the last 72 hours. Anemia Panel: No results for input(s): VITAMINB12, FOLATE, FERRITIN, TIBC, IRON, RETICCTPCT in the last 72 hours. Urine analysis:    Component Value Date/Time   COLORURINE YELLOW 04/23/2015 1940   APPEARANCEUR CLEAR 04/23/2015 1940   LABSPEC 1.015 04/23/2015 1940   PHURINE 6.0 04/23/2015 1940   GLUCOSEU NEGATIVE 04/23/2015 1940   HGBUR NEGATIVE 04/23/2015 1940   BILIRUBINUR NEGATIVE 04/23/2015 1940   KETONESUR NEGATIVE 04/23/2015 1940   PROTEINUR NEGATIVE 04/23/2015 1940   UROBILINOGEN 0.2 04/23/2015 1940   NITRITE NEGATIVE 04/23/2015 1940   LEUKOCYTESUR NEGATIVE 04/23/2015 1940   Sepsis Labs: @LABRCNTIP (procalcitonin:4,lacticidven:4)  ) Recent Results (from the past 240 hour(s))  Culture, blood (routine x 2) Call MD if unable to obtain prior to antibiotics being given     Status: None   Collection Time: 07/02/16 10:00 PM  Result Value Ref Range Status   Specimen Description BLOOD LEFT ANTECUBITAL  Final   Special  Requests BOTTLES DRAWN AEROBIC AND ANAEROBIC 5CC  Final   Culture NO GROWTH 5 DAYS  Final   Report Status 07/07/2016 FINAL  Final  Culture, blood (routine x 2) Call MD if unable to obtain prior to antibiotics being given     Status: None   Collection Time: 07/02/16 10:10 PM  Result Value Ref Range Status   Specimen Description BLOOD RIGHT HAND  Final   Special Requests BOTTLES DRAWN AEROBIC AND ANAEROBIC 4CC  Final   Culture NO GROWTH 5 DAYS  Final   Report Status 07/07/2016 FINAL  Final  MRSA PCR Screening     Status: None   Collection Time: 07/02/16 11:45 PM  Result Value Ref Range Status   MRSA by PCR NEGATIVE NEGATIVE Final    Comment:        The GeneXpert MRSA Assay (FDA approved for NASAL specimens only), is one component of a comprehensive MRSA colonization surveillance program. It is not intended to diagnose MRSA infection nor to guide or monitor treatment for MRSA infections.       Radiology Studies: No results found.   Scheduled Meds: . amLODipine  10 mg Oral Daily  . guaiFENesin  600 mg Oral BID  . hydrALAZINE  25 mg Oral Q8H  . hydrochlorothiazide  25 mg Oral Daily  . insulin aspart  0-9 Units Subcutaneous TID WC  . ipratropium-albuterol  3 mL Nebulization Q6H  . mometasone-formoterol  2 puff Inhalation BID  . polyethylene glycol  17 g Oral Daily  . [START ON 07/12/2016] predniSONE  60 mg Oral Q breakfast   Continuous Infusions:    LOS: 8 days   Time Spent in minutes   30 minutes  Sayan Aldava D.O. on 07/11/2016 at 10:48 AM  Between 7am to 7pm - Pager - [phone]  After 7pm go to [email or web address] - password [email or web address]  And look for the night coverage person covering for me after hours  [facility]  Group Office  [phone]

## 2016-07-11 NOTE — Progress Notes (Signed)
Inpatient Diabetes Program Recommendations  AACE/ADA: New Consensus Statement on Inpatient Glycemic Control (2015)  Target Ranges:  Prepandial:   less than 140 mg/dL      Peak postprandial:   less than 180 mg/dL (1-2 hours)      Critically ill patients:  140 - 180 mg/dL   Lab Results  Component Value Date   GLUCAP 325 (H) 07/11/2016   HGBA1C 5.9 (H) 07/09/2016    Review of Glycemic Control Results for [name], [name] (MRN [id number]) as of 07/11/2016 11:37  Ref. Range 07/10/2016 05:58 07/10/2016 17:22 07/10/2016 21:38 07/11/2016 05:39 07/11/2016 11:11  Glucose-Capillary Latest Ref Range: 65 - 99 mg/dL 409169 (H) 811363 (H) 914366 (H) 296 (H) 325 (H)   Inpatient Diabetes Program Recommendations:  Noted elevated CBGs 169-366. Please consider: -Moderate Novolog correction 0-15 units tid + 0-5 units hs while on steroids.  Thank you, Billy FischerJudy E. Jenesys Casseus, RN, MSN, CDE Inpatient Glycemic Control Team Team Pager [phone] (8am-5pm) 07/11/2016 11:40 AM

## 2016-07-12 DIAGNOSIS — R0600 Dyspnea, unspecified: Secondary | ICD-10-CM

## 2016-07-12 LAB — BASIC METABOLIC PANEL
Anion gap: 7 (ref 5–15)
BUN: 32 mg/dL — AB (ref 6–20)
CALCIUM: 9.3 mg/dL (ref 8.9–10.3)
CHLORIDE: 97 mmol/L — AB (ref 101–111)
CO2: 34 mmol/L — AB (ref 22–32)
CREATININE: 1.21 mg/dL (ref 0.61–1.24)
GFR calc Af Amer: >60 mL/min (ref >60)
GFR calc non Af Amer: >60 mL/min (ref >60)
GLUCOSE: 283 mg/dL — AB (ref 65–99)
Potassium: 3.8 mmol/L (ref 3.5–5.1)
Sodium: 138 mmol/L (ref 135–145)

## 2016-07-12 LAB — GLUCOSE, CAPILLARY
Glucose-Capillary: 240 mg/dL — ABNORMAL HIGH (ref 65–99)
Glucose-Capillary: 289 mg/dL — ABNORMAL HIGH (ref 65–99)

## 2016-07-12 MED ORDER — PREDNISONE 20 MG PO TABS: ORAL_TABLET | ORAL | 0 refills | Status: DC

## 2016-07-12 MED ORDER — GUAIFENESIN ER 600 MG PO TB12
600.0000 mg | ORAL_TABLET | Freq: Two times a day (BID) | ORAL | 0 refills | Status: DC
Start: 2016-07-12 — End: 2016-11-27

## 2016-07-12 MED ORDER — GUAIFENESIN-DM 100-10 MG/5ML PO SYRP: 5.0000 mL | ORAL_SOLUTION | ORAL | 0 refills | Status: DC | PRN

## 2016-07-12 MED ORDER — POLYETHYLENE GLYCOL 3350 17 G PO PACK: 17.0000 g | PACK | Freq: Every day | ORAL | 0 refills | Status: DC | PRN

## 2016-07-12 MED ORDER — IPRATROPIUM-ALBUTEROL 0.5-2.5 (3) MG/3ML IN SOLN: 3.0000 mL | Freq: Four times a day (QID) | RESPIRATORY_TRACT | 0 refills | Status: DC | PRN

## 2016-07-12 NOTE — Progress Notes (Signed)
Report called to Nurse Anne HahnWillis r/t pt being d/c today.  Verbalized understanding.  Amanda PeaNellie Gaspar Fowle, Charity fundraiserN.

## 2016-07-12 NOTE — Progress Notes (Signed)
NAD during the night, remained alert and oriented, able to voice commands, report will be given to on-coming nurse.

## 2016-07-12 NOTE — Progress Notes (Signed)
All d/c instructions explained and given to pt.  Verblized understanding.  D/c instructions paper work given to guarding at the bedside.  Amanda PeaNellie Itati Brocksmith, Charity fundraiserN.

## 2016-07-12 NOTE — Progress Notes (Signed)
Inpatient Diabetes Program Recommendations  AACE/ADA: New Consensus Statement on Inpatient Glycemic Control (2015)  Target Ranges:  Prepandial:   less than 140 mg/dL      Peak postprandial:   less than 180 mg/dL (1-2 hours)      Critically ill patients:  140 - 180 mg/dL   Results for [name], [name] (MRN [id number]) as of 07/12/2016 10:32  Ref. Range 07/09/2016 13:09  Hemoglobin A1C Latest Ref Range: 4.8 - 5.6 % 5.9 (H)   Review of Glycemic Control  Diabetes history: DM2 (diet controlled) Outpatient Diabetes medications: None Current orders for Inpatient glycemic control: Novolog 0-20 units TID with meals, Novolog 0-5 units QHS  Inpatient Diabetes Program Recommendations:  Insulin - Meal Coverage: Post prandial glucose is consistently elevated likely due to steroids. While inpatient and if steroids are continued, please consider ordering Novolog 4 units TID with meals for meal coverage. HgbA1C: Patient has DM2 history which is diet controlled. A1C 5.9% indicating good glycemic control.  Diet: Please change diet from Regular to Carb Modified.   Thanks, Orlando PennerMarie Dashea Mcmullan, RN, MSN, CDE Diabetes Coordinator Inpatient Diabetes Program [phone] (Team Pager from 8am to 5pm) [phone] ([facility] office) [phone] [facility] office) [phone] [facility] office)

## 2016-07-12 NOTE — Progress Notes (Signed)
At 1338 pt d/c off floor to awaiting transport with office accompanied.  Zosia Lucchese,RN

## 2016-07-12 NOTE — Discharge Summary (Signed)
Physician Discharge Summary  [name] ZOX:[id number] DOB: Jul 30, 1956 DOA: 07/02/2016  PCP: No PCP Per Patient  Admit date: 07/02/2016 Discharge date: 07/12/2016  Time spent: 45 minutes  Recommendations for Outpatient Follow-up:  Patient will be discharged to "home".  Patient will need to follow up with primary care provider within one week of discharge, repeat BMP.  Patient should also have yearly imaging of his thoracic aorta.  Patient should continue medications as prescribed.  Patient should follow a heart healthy diet.   Discharge Diagnoses:  Strep PNA/Acute COPD exacerbation Atypical chest pain  HTN, essential  Ascending thoracic aortic prominence. Hypokalemia Abdominal distention Hyperglycemia Obesity   Discharge Condition: Stable  Diet recommendation: heart healthy  Filed Weights   07/10/16 0501 07/11/16 0505 07/12/16 0609  Weight: 106.9 kg (235 lb 9.6 oz) 106.5 kg (234 lb 12.8 oz) 106.1 kg (234 lb)    History of present illness:  On 07/02/2016 by Dr. Charlette Caffey Holben is a 60 y.o. gentleman with a history of chronic diastolic heart failure, HTN, HLD, diet controlled "borderline" DM, COPD, and asthma who presents to the ED for evaluation of progressive SOB x one week, chest tightness with increase wheezing and cough productive of yellow sputum x three days.  He is an inmate, and he reports that he has had sick contacts at his correctional facility.  No documented fever but he has had chills and sweats.  No nausea or vomiting.  No hemoptysis.  He has a had a sore throat and post-nasal drip.  He does not wear oxygen at baseline.  Hospital Course:  Strep PNA/Acute COPD exacerbation -IV Rocephin and PO azithromycin per pneumonia protocol initially started, transitioned to oral Cephalexin, completed 8 days of therapy  -Blood cultures streptococcal antigens- positive -HIV non-reactive -Was transitioned to PO prednisone, however developed increased wheezing on  10/17. -Transitioned to PO prednisone 10/18, appears to be improving, slowly -Would continue nebs as needed for wheezing, restart Spiriva upon discharge and continue symbicort -Recommend repeat cxr in 1-2 weeks.   Atypical chest pain  -suspect related to his COPD exacerbation -Chest pain resolved  -Cardiac enzymes x 3 negative   HTN, essential  -Continue home doses of HCTZ and amlodipine  Ascending thoracic aortic prominence. -Measured ascending thoracic aortic diameter is 4.2 x 4.1 cm.  -Recommend annual imaging followup by CTA or MRA.   Hypokalemia -Resolved -Repeat BMP in one week  Abdominal distention -KUB shows unremarkable gas pattern -miralax daily  Hyperglycemia -hemoglobin A1C 5.9, this is likely steroid induced -would not place on antihyperglycemic regimen upon discharge   Obesity  -BMI 35.82  -Follow up with PCP to discuss lifestyle modifications, including diet and exercise.  Consultants None  Procedures  None  Discharge Exam: Vitals:   07/12/16 0609 07/12/16 1009  BP: 139/78 (!) 148/97  Pulse: 88   Resp: 18   Temp: 98.6 F (37 C)    Patient feels his cough and breathing are improving.  Denies chest pain, abdominal pain, dizziness, headache.   Exam  General: Well developed, well nourished, NAD  HEENT: NCAT,  mucous membranes moist.   Cardiovascular: S1 S2 auscultated, no murmurs, RRR  Respiratory: Diffuse exp wheezing with occ cough (improving).  Abdomen: Soft, nontender, nondistended, + bowel sounds  Extremities: warm dry without cyanosis clubbing or edema  Neuro: AAOx3, nonfocal  Psych: Normal affect and demeanor, pleasant  Discharge Instructions Discharge Instructions    Discharge instructions    Complete by:  As directed    Patient  will be discharged to "home".  Patient will need to follow up with primary care provider within one week of discharge, repeat BMP.  Patient should also have yearly imaging of his thoracic  aorta.  Patient should continue medications as prescribed.  Patient should follow a heart healthy diet.     Current Discharge Medication List    START taking these medications   Details  guaiFENesin (MUCINEX) 600 MG 12 hr tablet Take 1 tablet (600 mg total) by mouth 2 (two) times daily. Qty: 14 tablet, Refills: 0    guaiFENesin-dextromethorphan (ROBITUSSIN DM) 100-10 MG/5ML syrup Take 5 mLs by mouth every 4 (four) hours as needed for cough. Qty: 118 mL, Refills: 0    ipratropium-albuterol (DUONEB) 0.5-2.5 (3) MG/3ML SOLN Take 3 mLs by nebulization every 6 (six) hours as needed. Qty: 360 mL, Refills: 0    polyethylene glycol (MIRALAX / GLYCOLAX) packet Take 17 g by mouth daily as needed for mild constipation or moderate constipation. Qty: 14 each, Refills: 0    predniSONE (DELTASONE) 20 MG tablet Take 60mg  (3 tabs) x 3 days, then taper to 40mg  (2 tabs) x 3 days, then 20mg  (1 tabs) x 3days then stop. Qty: 18 tablet, Refills: 0      CONTINUE these medications which have NOT CHANGED   Details  albuterol (PROVENTIL HFA;VENTOLIN HFA) 108 (90 Base) MCG/ACT inhaler Inhale 2 puffs into the lungs 4 (four) times daily as needed for wheezing or shortness of breath.     amLODipine (NORVASC) 10 MG tablet Take 10 mg by mouth daily.    budesonide-formoterol (SYMBICORT) 80-4.5 MCG/ACT inhaler Inhale 2 puffs into the lungs 2 (two) times daily.    hydrochlorothiazide (HYDRODIURIL) 25 MG tablet Take 25 mg by mouth daily.    ibuprofen (ADVIL,MOTRIN) 800 MG tablet Take 800 mg by mouth every 8 (eight) hours as needed for headache (pain).    tiotropium (SPIRIVA) 18 MCG inhalation capsule Place 18 mcg into inhaler and inhale daily.       Allergies  Allergen Reactions  . Morphine Itching   Follow-up Information    Primary care provider. Schedule an appointment as soon as possible for a visit in 1 week(s).   Why:  Hospital follow up           The results of significant diagnostics from this  hospitalization (including imaging, microbiology, ancillary and laboratory) are listed below for reference.    Significant Diagnostic Studies: Dg Chest 2 View  Result Date: 07/03/2016 CLINICAL DATA:  Onset of cough and wheezing today. Follow-up of earlier pneumonia EXAM: CHEST  2 VIEW COMPARISON:  Portable chest x-ray of July 02, 2016 and May 20, 2016 and PA and lateral chest x-ray of November 12, 2015. FINDINGS: There remains increased density at the right lung base. There is chronic elevation of the right hemidiaphragm. The left lung is clear. The heart and pulmonary vascularity are normal. The mediastinum is normal in width. There is calcification in the wall of the aortic arch. The bony thorax is unremarkable. IMPRESSION: Persistent atelectasis or infiltrate in the right lower lobe posteriorly. Aortic atherosclerosis. Electronically Signed   By: David  [place] M.D.   On: 07/03/2016 09:04   Dg Abd 1 View  Result Date: 07/03/2016 CLINICAL DATA:  Acute onset of generalized abdominal distention and abdominal pain. Initial encounter. EXAM: ABDOMEN - 1 VIEW COMPARISON:  Abdominal radiograph performed 07/30/2011 FINDINGS: The visualized bowel gas pattern is unremarkable. Scattered air and stool filled loops of colon are seen; no  abnormal dilatation of small bowel loops is seen to suggest small bowel obstruction. A single distended loop of small bowel is noted, nonspecific in appearance. No free intra-abdominal air is identified, though evaluation for free air is limited on supine views. The visualized osseous structures are within normal limits; the sacroiliac joints are unremarkable in appearance. A ventriculoperitoneal shunt is partially imaged, ending overlying the right hemipelvis. IMPRESSION: Unremarkable bowel gas pattern; no free intra-abdominal air seen. Small to moderate amount of stool noted in the colon. Electronically Signed   By: Roanna Raider M.D.   On: 07/03/2016 02:27   Ct Chest Wo  Contrast  Result Date: 07/09/2016 CLINICAL DATA:  Shortness of Breath EXAM: CT CHEST WITHOUT CONTRAST TECHNIQUE: Multidetector CT imaging of the chest was performed following the standard protocol without IV contrast. COMPARISON:  Chest radiograph July 03, 2016 FINDINGS: Cardiovascular: Ascending thoracic aorta has a measured diameter of 4.2 x 4.1 cm. Visualized great vessels appear unremarkable. The pericardium is not appreciably thickened. Mediastinum/Nodes: Thyroid appears unremarkable. There is no appreciable thoracic adenopathy. Lungs/Pleura: There is atelectatic change in the right base with elevation of the right hemidiaphragm. There is no parenchymal lung edema or consolidation evident. There is no appreciable pleural effusion or pleural thickening. Upper Abdomen: Visualized upper abdominal structures appear unremarkable. Musculoskeletal: There is a shunt catheter extending along the anterior right hemithorax. There is degenerative change in the lower thoracic spine. There are no blastic or lytic bone lesions evident. IMPRESSION: Ascending thoracic aortic prominence. Measured ascending thoracic aortic diameter is 4.2 x 4.1 cm. Recommend annual imaging followup by CTA or MRA. This recommendation follows 2010 ACCF/AHA/AATS/ACR/ASA/SCA/SCAI/SIR/STS/SVM Guidelines for the Diagnosis and Management of Patients with Thoracic Aortic Disease. Circulation. 2010; 121: O962-X528. Elevated right hemidiaphragm with right base atelectasis. No lung edema or consolidation. No adenopathy. Electronically Signed   By: Bretta Bang III M.D.   On: 07/09/2016 07:07   Dg Chest Portable 1 View  Result Date: 07/02/2016 CLINICAL DATA:  Shortness of breath EXAM: PORTABLE CHEST 1 VIEW COMPARISON:  05/20/2016 FINDINGS: AP portable upright view chest. Right-sided shunt tubing is identified. Right hemidiaphragm is elevated as before. Hazy opacity in the right base could reflect atelectasis or a mild infiltrate. Stable  cardiomediastinal silhouette with tortuous aorta. No pneumothorax. IMPRESSION: 1. Elevated right hemidiaphragm. Hazy atelectasis versus mild infiltrate right base. 2. Otherwise no significant interval changes Electronically Signed   By: Jasmine Pang M.D.   On: 07/02/2016 15:10    Microbiology: Recent Results (from the past 240 hour(s))  Culture, blood (routine x 2) Call MD if unable to obtain prior to antibiotics being given     Status: None   Collection Time: 07/02/16 10:00 PM  Result Value Ref Range Status   Specimen Description BLOOD LEFT ANTECUBITAL  Final   Special Requests BOTTLES DRAWN AEROBIC AND ANAEROBIC 5CC  Final   Culture NO GROWTH 5 DAYS  Final   Report Status 07/07/2016 FINAL  Final  Culture, blood (routine x 2) Call MD if unable to obtain prior to antibiotics being given     Status: None   Collection Time: 07/02/16 10:10 PM  Result Value Ref Range Status   Specimen Description BLOOD RIGHT HAND  Final   Special Requests BOTTLES DRAWN AEROBIC AND ANAEROBIC 4CC  Final   Culture NO GROWTH 5 DAYS  Final   Report Status 07/07/2016 FINAL  Final  MRSA PCR Screening     Status: None   Collection Time: 07/02/16 11:45 PM  Result Value  Ref Range Status   MRSA by PCR NEGATIVE NEGATIVE Final    Comment:        The GeneXpert MRSA Assay (FDA approved for NASAL specimens only), is one component of a comprehensive MRSA colonization surveillance program. It is not intended to diagnose MRSA infection nor to guide or monitor treatment for MRSA infections.      Labs: Basic Metabolic Panel:  Recent Labs Lab 07/07/16 0549 07/08/16 0439 07/09/16 0318 07/09/16 1819 07/11/16 0350 07/12/16 0406  NA 139 139 136  --  136 138  K 3.2* 3.9 4.2  --  4.4 3.8  CL 94* 95* 95*  --  95* 97*  CO2 36* 35* 30  --  31 34*  GLUCOSE 217* 194* 239* 451* 374* 283*  BUN 24* 24* 27*  --  31* 32*  CREATININE 1.07 1.06 1.06  --  1.31* 1.21  CALCIUM 9.0 9.4 9.4  --  9.6 9.3   Liver Function  Tests: No results for input(s): AST, ALT, ALKPHOS, BILITOT, PROT, ALBUMIN in the last 168 hours. No results for input(s): LIPASE, AMYLASE in the last 168 hours. No results for input(s): AMMONIA in the last 168 hours. CBC:  Recent Labs Lab 07/07/16 0549 07/08/16 0439 07/09/16 0318 07/11/16 0350  WBC 22.5* 21.1* 21.4* 23.9*  HGB 13.2 13.5 13.7 14.4  HCT 41.4 42.3 43.4 43.7  MCV 95.8 96.1 96.4 95.2  PLT 247 249 257 256   Cardiac Enzymes: No results for input(s): CKTOTAL, CKMB, CKMBINDEX, TROPONINI in the last 168 hours. BNP: BNP (last 3 results)  Recent Labs  02/18/16 0828 04/13/16 2029  BNP 15.0 15.0    ProBNP (last 3 results) No results for input(s): PROBNP in the last 8760 hours.  CBG:  Recent Labs Lab 07/11/16 0539 07/11/16 1111 07/11/16 1612 07/11/16 2016 07/12/16 0544  GLUCAP 296* 325* 471* 362* 240*       Signed:  Edsel PetrinMIKHAIL, Lukas Pelcher  [facility] 07/12/2016, 10:48 AM

## 2016-11-27 ENCOUNTER — Emergency Department (HOSPITAL_COMMUNITY)

## 2016-11-27 ENCOUNTER — Encounter (HOSPITAL_COMMUNITY): Payer: Self-pay | Admitting: *Deleted

## 2016-11-27 ENCOUNTER — Inpatient Hospital Stay (HOSPITAL_COMMUNITY)
Admission: EM | Admit: 2016-11-27 | Discharge: 2016-12-01 | DRG: 190 | Disposition: A | Discharge status: Home / Self Care | Attending: Family Medicine | Admitting: Family Medicine

## 2016-11-27 DIAGNOSIS — E119 Type 2 diabetes mellitus without complications: Secondary | ICD-10-CM | POA: Diagnosis present

## 2016-11-27 DIAGNOSIS — Z7951 Long term (current) use of inhaled steroids: Secondary | ICD-10-CM | POA: Diagnosis not present

## 2016-11-27 DIAGNOSIS — Z8249 Family history of ischemic heart disease and other diseases of the circulatory system: Secondary | ICD-10-CM

## 2016-11-27 DIAGNOSIS — Z833 Family history of diabetes mellitus: Secondary | ICD-10-CM | POA: Diagnosis not present

## 2016-11-27 DIAGNOSIS — J9620 Acute and chronic respiratory failure, unspecified whether with hypoxia or hypercapnia: Secondary | ICD-10-CM | POA: Diagnosis present

## 2016-11-27 DIAGNOSIS — Z825 Family history of asthma and other chronic lower respiratory diseases: Secondary | ICD-10-CM | POA: Diagnosis not present

## 2016-11-27 DIAGNOSIS — R000 Tachycardia, unspecified: Secondary | ICD-10-CM | POA: Diagnosis present

## 2016-11-27 DIAGNOSIS — R51 Headache: Secondary | ICD-10-CM

## 2016-11-27 DIAGNOSIS — N400 Enlarged prostate without lower urinary tract symptoms: Secondary | ICD-10-CM | POA: Diagnosis present

## 2016-11-27 DIAGNOSIS — I110 Hypertensive heart disease with heart failure: Secondary | ICD-10-CM | POA: Diagnosis present

## 2016-11-27 DIAGNOSIS — I10 Essential (primary) hypertension: Secondary | ICD-10-CM

## 2016-11-27 DIAGNOSIS — J9600 Acute respiratory failure, unspecified whether with hypoxia or hypercapnia: Secondary | ICD-10-CM | POA: Diagnosis not present

## 2016-11-27 DIAGNOSIS — R079 Chest pain, unspecified: Secondary | ICD-10-CM | POA: Diagnosis not present

## 2016-11-27 DIAGNOSIS — R071 Chest pain on breathing: Secondary | ICD-10-CM | POA: Diagnosis not present

## 2016-11-27 DIAGNOSIS — E876 Hypokalemia: Secondary | ICD-10-CM | POA: Diagnosis present

## 2016-11-27 DIAGNOSIS — IMO0001 Reserved for inherently not codable concepts without codable children: Secondary | ICD-10-CM | POA: Diagnosis present

## 2016-11-27 DIAGNOSIS — I5032 Chronic diastolic (congestive) heart failure: Secondary | ICD-10-CM | POA: Diagnosis present

## 2016-11-27 DIAGNOSIS — Z79899 Other long term (current) drug therapy: Secondary | ICD-10-CM

## 2016-11-27 DIAGNOSIS — G919 Hydrocephalus, unspecified: Secondary | ICD-10-CM | POA: Diagnosis present

## 2016-11-27 DIAGNOSIS — R7989 Other specified abnormal findings of blood chemistry: Secondary | ICD-10-CM | POA: Diagnosis present

## 2016-11-27 DIAGNOSIS — R062 Wheezing: Secondary | ICD-10-CM

## 2016-11-27 DIAGNOSIS — G8929 Other chronic pain: Secondary | ICD-10-CM | POA: Diagnosis present

## 2016-11-27 DIAGNOSIS — E785 Hyperlipidemia, unspecified: Secondary | ICD-10-CM | POA: Diagnosis present

## 2016-11-27 DIAGNOSIS — Z885 Allergy status to narcotic agent status: Secondary | ICD-10-CM | POA: Diagnosis not present

## 2016-11-27 DIAGNOSIS — Z982 Presence of cerebrospinal fluid drainage device: Secondary | ICD-10-CM | POA: Diagnosis not present

## 2016-11-27 DIAGNOSIS — J449 Chronic obstructive pulmonary disease, unspecified: Secondary | ICD-10-CM | POA: Diagnosis not present

## 2016-11-27 DIAGNOSIS — R519 Headache, unspecified: Secondary | ICD-10-CM | POA: Diagnosis present

## 2016-11-27 DIAGNOSIS — R0600 Dyspnea, unspecified: Secondary | ICD-10-CM | POA: Diagnosis present

## 2016-11-27 DIAGNOSIS — J441 Chronic obstructive pulmonary disease with (acute) exacerbation: Secondary | ICD-10-CM | POA: Diagnosis present

## 2016-11-27 DIAGNOSIS — N179 Acute kidney failure, unspecified: Secondary | ICD-10-CM | POA: Diagnosis present

## 2016-11-27 LAB — BASIC METABOLIC PANEL
Anion gap: 7 (ref 5–15)
BUN: 17 mg/dL (ref 6–20)
CO2: 36 mmol/L — ABNORMAL HIGH (ref 22–32)
CREATININE: 1.26 mg/dL — AB (ref 0.61–1.24)
Calcium: 9.6 mg/dL (ref 8.9–10.3)
Chloride: 97 mmol/L — ABNORMAL LOW (ref 101–111)
GFR calc Af Amer: >60 mL/min (ref >60)
GLUCOSE: 133 mg/dL — AB (ref 65–99)
Potassium: 2.5 mmol/L — CL (ref 3.5–5.1)
SODIUM: 140 mmol/L (ref 135–145)

## 2016-11-27 LAB — CBC
HEMATOCRIT: 45 % (ref 39.0–52.0)
Hemoglobin: 15.1 g/dL (ref 13.0–17.0)
MCH: 31.1 pg (ref 26.0–34.0)
MCHC: 33.6 g/dL (ref 30.0–36.0)
MCV: 92.6 fL (ref 78.0–100.0)
PLATELETS: 254 10*3/uL (ref 150–400)
RBC: 4.86 MIL/uL (ref 4.22–5.81)
RDW: 12.9 % (ref 11.5–15.5)
WBC: 8.6 10*3/uL (ref 4.0–10.5)

## 2016-11-27 LAB — I-STAT TROPONIN, ED: TROPONIN I, POC: 0.01 ng/mL (ref 0.00–0.08)

## 2016-11-27 LAB — BRAIN NATRIURETIC PEPTIDE: B Natriuretic Peptide: 15 pg/mL (ref 0.0–100.0)

## 2016-11-27 MED ORDER — METHYLPREDNISOLONE SODIUM SUCC 125 MG IJ SOLR
125.0000 mg | Freq: Once | INTRAMUSCULAR | Status: AC
  Administered 2016-11-27: 125 mg via INTRAVENOUS
  Filled 2016-11-27: qty 2

## 2016-11-27 MED ORDER — LORAZEPAM 2 MG/ML IJ SOLN
0.5000 mg | Freq: Once | INTRAMUSCULAR | Status: AC
  Administered 2016-11-27: 0.5 mg via INTRAVENOUS
  Filled 2016-11-27: qty 1

## 2016-11-27 MED ORDER — IPRATROPIUM-ALBUTEROL 0.5-2.5 (3) MG/3ML IN SOLN
3.0000 mL | Freq: Four times a day (QID) | RESPIRATORY_TRACT | Status: DC
  Administered 2016-11-27 – 2016-12-01 (×13): 3 mL via RESPIRATORY_TRACT
  Filled 2016-11-27 (×12): qty 3

## 2016-11-27 MED ORDER — LORAZEPAM 2 MG/ML IJ SOLN
0.5000 mg | INTRAMUSCULAR | Status: DC | PRN
  Administered 2016-11-28 – 2016-12-01 (×2): 1 mg via INTRAVENOUS
  Filled 2016-11-27 (×3): qty 1

## 2016-11-27 MED ORDER — POTASSIUM CHLORIDE 10 MEQ/100ML IV SOLN
10.0000 meq | Freq: Once | INTRAVENOUS | Status: AC
  Administered 2016-11-27: 10 meq via INTRAVENOUS
  Filled 2016-11-27: qty 100

## 2016-11-27 MED ORDER — ALBUTEROL (5 MG/ML) CONTINUOUS INHALATION SOLN
10.0000 mg/h | INHALATION_SOLUTION | Freq: Once | RESPIRATORY_TRACT | Status: AC
  Administered 2016-11-27: 10 mg/h via RESPIRATORY_TRACT
  Filled 2016-11-27: qty 20

## 2016-11-27 MED ORDER — IPRATROPIUM BROMIDE 0.02 % IN SOLN
1.0000 mg | Freq: Once | RESPIRATORY_TRACT | Status: AC
  Administered 2016-11-27: 1 mg via RESPIRATORY_TRACT
  Filled 2016-11-27: qty 5

## 2016-11-27 MED ORDER — POTASSIUM CHLORIDE 20 MEQ PO PACK
40.0000 meq | PACK | Freq: Three times a day (TID) | ORAL | Status: AC
  Administered 2016-11-28 (×3): 40 meq via ORAL
  Filled 2016-11-27 (×3): qty 2

## 2016-11-27 MED ORDER — IPRATROPIUM-ALBUTEROL 0.5-2.5 (3) MG/3ML IN SOLN
RESPIRATORY_TRACT | Status: AC
  Filled 2016-11-27: qty 3

## 2016-11-27 MED ORDER — ACETAMINOPHEN 500 MG PO TABS
1000.0000 mg | ORAL_TABLET | Freq: Four times a day (QID) | ORAL | Status: DC | PRN
  Administered 2016-11-27: 1000 mg via ORAL
  Filled 2016-11-27: qty 2

## 2016-11-27 MED ORDER — POTASSIUM CHLORIDE CRYS ER 20 MEQ PO TBCR
40.0000 meq | EXTENDED_RELEASE_TABLET | Freq: Once | ORAL | Status: AC
  Administered 2016-11-27: 40 meq via ORAL
  Filled 2016-11-27: qty 2

## 2016-11-27 NOTE — ED Notes (Signed)
Pt complaining of right sided head pain. EDP notified.

## 2016-11-27 NOTE — H&P (Signed)
[facility] History and Physical  [name] ZOX:[id number] DOB: 02/14/1956 DOA: 11/27/2016  Referring physician: DR Merla RichesMcManus, [facility] ED PCP: No PCP Per Patient   Chief Complaint: Dyspnea  HPI: [name] is a 61 y.o. male with history of COPD, chronic HA s/p VP shunt for hydrocephalus, and HTN presents brought apparently from a jail/ prison with SOB and chest pain.  EKG and CXR in ED are negative and trop is negative.  Pt was wheezing and SOB , rec'd IV steroids and 1 hr long neb.  Still SOB and wheezing.  Coughing up yellow sputum.  Some anterior CP.  No fevers or chills.    Denies abd pain, n/v/d, no urinary c/o's.     Old chart: Aug 2017 > COPD/asthma exacerbation, chronic HA sp VP shunt for hydrocephalus '28 Jun 2016 > strep PNA/ COPD exacerbation, rx'd with IV then po abx, nebs. HIV neg, urine strep Ag +. dc'd on po pred . Ascending thoracic aorta prominence, 4.2x 4.1 cm.  Recommended annual imaging f/u by CTA or MRA.    ROS  no joint pain   no HA  no blurry vision  no rash  no diarrhea  no nausea/ vomiting  no dysuria  no difficulty voiding  no change in urine color    Past Medical History  Past Medical History:  Diagnosis Date  . Abdominal hernia   . Asthma   . BPH (benign prostatic hyperplasia)   . Chronic pain   . COPD (chronic obstructive pulmonary disease) (HCC)   . Diabetes mellitus   . Diastolic CHF (HCC)   . Headache(784.0)    chronic, daily  . Hyperlipidemia   . Hypertension   . Substance abuse    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 Cocaine   Past Surgical History  Past Surgical History:  Procedure Laterality Date  . BRAIN SURGERY    . COLON SURGERY  06/2011   Diverticulitis Complicated  . MANDIBLE SURGERY    . VENTRICULOPERITONEAL SHUNT     Family History  Family History  Problem Relation Age of Onset  . Hypertension Mother   . Hyperlipidemia Mother   . Depression Mother   . Hypertension Father   . Hyperlipidemia Father   . Diabetes Father    . Arthritis    . Asthma     Social History  reports that he has never smoked. He has never used smokeless tobacco. He reports that he does not drink alcohol or use drugs. Allergies  Allergies  Allergen Reactions  . Morphine Itching   Home medications Prior to Admission medications   Medication Sig Start Date End Date Taking? Authorizing Provider  albuterol (PROVENTIL HFA;VENTOLIN HFA) 108 (90 Base) MCG/ACT inhaler Inhale 2 puffs into the lungs 4 (four) times daily as needed for wheezing or shortness of breath.    Yes Historical Provider, MD  amLODipine (NORVASC) 10 MG tablet Take 10 mg by mouth daily.   Yes Historical Provider, MD  budesonide-formoterol (SYMBICORT) 80-4.5 MCG/ACT inhaler Inhale 2 puffs into the lungs 2 (two) times daily.   Yes Historical Provider, MD  hydrochlorothiazide (HYDRODIURIL) 25 MG tablet Take 25 mg by mouth daily.   Yes Historical Provider, MD  ipratropium-albuterol (DUONEB) 0.5-2.5 (3) MG/3ML SOLN Take 3 mLs by nebulization every 6 (six) hours as needed. 07/12/16  Yes Maryann Mikhail, DO  tiotropium (SPIRIVA) 18 MCG inhalation capsule Place 18 mcg into inhaler and inhale daily.   Yes Historical Provider, MD  polyethylene glycol (MIRALAX / GLYCOLAX) packet Take 17  g by mouth daily as needed for mild constipation or moderate constipation. Patient not taking: Reported on 11/27/2016 07/12/16   Edsel Petrin, DO   Liver Function Tests No results for input(s): AST, ALT, ALKPHOS, BILITOT, PROT, ALBUMIN in the last 168 hours. No results for input(s): LIPASE, AMYLASE in the last 168 hours. CBC  Recent Labs Lab 11/27/16 2001  WBC 8.6  HGB 15.1  HCT 45.0  MCV 92.6  PLT 254   Basic Metabolic Panel  Recent Labs Lab 11/27/16 2001  NA 140  K 2.5*  CL 97*  CO2 36*  GLUCOSE 133*  BUN 17  CREATININE 1.26*  CALCIUM 9.6     Vitals:   11/27/16 2100 11/27/16 2200 11/27/16 2220 11/27/16 2230  BP: 136/86 144/74 137/73 123/66  Pulse: 73 84 86 86  Resp: 10   26 17   Temp:      TempSrc:      SpO2: 100% 99% 95% 93%  Weight:      Height:       Exam: Gen middle aged AAM, not in distress, slightly tachypneic, intermittently anxious No rash, cyanosis or gangrene Sclera anicteric, throat clear No jvd or bruits Chest diffuse sig exp wheezing, air movement is poor, no bronchial BS or rales RRR no MRG, tachy Abd soft ntnd no mass or ascites +bs, midline vertical abd scar GU normal male MS no joint effusions or deformity Ext no LE edema / no wounds or ulcers Neuro is alert, Ox 3 , nf   Na 140  K 2.5   CO2 36   BUN 17  Cr 1.26    BNP 15  Trop 0.01 WBC 8k  Hb 15  plt 254  Glu 133    EKG (independ reviewed) >  NSR 71 bpm, no ischemic changes Prolonged PR interval Left axis deviation Left ventricular hypertrophy Inferior infarct, old Anterior Q waves, possibly due to LVH  CXR (independ reviewed) > clear, no active disease   Assessment: 1. Acute on chronic resp failure - due to COPD exacerbation. Markedly high serum CO2, suggesting sig CO2 retention. Will get ABG. No fever or ^wbc.  Remains tight in ED despite nebs/ IV steroids. Repeat nebs , cont IV steroids, IV ativan prn, IV Mg 2gm x 1, observe closely.  Admit to SDU.   2. Chronic HA sp VP shunt for hydrocephalus - in 2005 per pt 3. HTN - cont norvasc, bp's good 4. Hist diast CHF - CXR clear and no vol overload on exam 5. BPH 6. COPD/ asthma - as above  7. Hypokalemia - not sure reason for this, is on a thiazide diuretic. Replace po 40 tid x 4  Plan - as above     Savana Spina D [facility] Pager [phone]   If 7PM-7AM, please contact night-coverage [email or web address] Password [email or web address] 11/27/2016, 11:21 PM

## 2016-11-27 NOTE — ED Provider Notes (Signed)
[facility]Y DEPT Provider Note   CSN: [id number] Arrival date & time: 11/27/16  1941     History   Chief Complaint Chief Complaint  Patient presents with  . Chest Pain    HPI Chanetta MarshallJimmy Fyock is a 61 y.o. male.   Chest Pain      Pt was seen at 1950.  Per pt, c/o gradual onset and worsening of persistent cough, wheezing and SOB for the past 3 days.  Describes his symptoms as "my COPD."  Has been using home O2, MDI and nebs with transient relief. Has been associated with constant "sharp" chest discomfort for the past 3 days. Describes the cough as productive of "yellow" sputum.  Denies palpitations, no back pain, no abd pain, no N/V/D, no fevers, no rash.    Past Medical History:  Diagnosis Date  . Abdominal hernia   . Asthma   . BPH (benign prostatic hyperplasia)   . Chronic pain   . COPD (chronic obstructive pulmonary disease) (HCC)   . Diabetes mellitus   . Diastolic CHF (HCC)   . Headache(784.0)    chronic, daily  . Hyperlipidemia   . Hypertension   . Substance abuse    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 Cocaine    Patient Active Problem List   Diagnosis Date Noted  . Dyspnea   . Abdominal distention   . Community acquired pneumonia of right lower lobe of lung (HCC) 07/02/2016  . Leukocytosis 05/21/2016  . COPD exacerbation (HCC) 05/20/2016  . Acute respiratory failure (HCC) 05/20/2016  . Hypokalemia 05/20/2016  . Obesity 04/28/2013  . COPD with exacerbation (HCC) 08/05/2012  . Rotator cuff tear, left 05/22/2012  . Rotator cuff syndrome of left shoulder 05/22/2012  . Chronic pain 02/04/2012  . Environmental allergies 02/04/2012  . COPD (chronic obstructive pulmonary disease) (HCC) 12/05/2011  . Hyperlipidemia 11/20/2011  . Headache, chronic daily 11/20/2011  . 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 Seizure prophylaxis 11/20/2011  . Substance abuse 11/20/2011  . Diabetes mellitus type II, controlled (HCC) 10/26/2009  . HTN (hypertension) 10/26/2009  . CHF 10/26/2009    Past  Surgical History:  Procedure Laterality Date  . BRAIN SURGERY    . COLON SURGERY  06/2011   Diverticulitis Complicated  . MANDIBLE SURGERY    . VENTRICULOPERITONEAL SHUNT         Home Medications    Prior to Admission medications   Medication Sig Start Date End Date Taking? Authorizing Provider  albuterol (PROVENTIL HFA;VENTOLIN HFA) 108 (90 Base) MCG/ACT inhaler Inhale 2 puffs into the lungs 4 (four) times daily as needed for wheezing or shortness of breath.     Historical Provider, MD  amLODipine (NORVASC) 10 MG tablet Take 10 mg by mouth daily.    Historical Provider, MD  budesonide-formoterol (SYMBICORT) 80-4.5 MCG/ACT inhaler Inhale 2 puffs into the lungs 2 (two) times daily.    Historical Provider, MD  guaiFENesin (MUCINEX) 600 MG 12 hr tablet Take 1 tablet (600 mg total) by mouth 2 (two) times daily. 07/12/16   Maryann Mikhail, DO  guaiFENesin-dextromethorphan (ROBITUSSIN DM) 100-10 MG/5ML syrup Take 5 mLs by mouth every 4 (four) hours as needed for cough. 07/12/16   Maryann Mikhail, DO  hydrochlorothiazide (HYDRODIURIL) 25 MG tablet Take 25 mg by mouth daily.    Historical Provider, MD  ibuprofen (ADVIL,MOTRIN) 800 MG tablet Take 800 mg by mouth every 8 (eight) hours as needed for headache (pain).    Historical Provider, MD  ipratropium-albuterol (DUONEB) 0.5-2.5 (3) MG/3ML SOLN Take  3 mLs by nebulization every 6 (six) hours as needed. 07/12/16   Maryann Mikhail, DO  polyethylene glycol (MIRALAX / GLYCOLAX) packet Take 17 g by mouth daily as needed for mild constipation or moderate constipation. 07/12/16   Maryann Mikhail, DO  predniSONE (DELTASONE) 20 MG tablet Take 60mg  (3 tabs) x 3 days, then taper to 40mg  (2 tabs) x 3 days, then 20mg  (1 tabs) x 3days then stop. 07/12/16   Maryann Mikhail, DO  tiotropium (SPIRIVA) 18 MCG inhalation capsule Place 18 mcg into inhaler and inhale daily.    Historical Provider, MD    Family History Family History  Problem Relation Age of Onset    . Hypertension Mother   . Hyperlipidemia Mother   . Depression Mother   . Hypertension Father   . Hyperlipidemia Father   . Diabetes Father   . Arthritis    . Asthma      Social History Social History  Substance Use Topics  . Smoking status: Never Smoker  . Smokeless tobacco: Never Used  . Alcohol use No     Allergies   Morphine   Review of Systems Review of Systems  Cardiovascular: Positive for chest pain.  ROS: Statement: All systems negative except as marked or noted in the HPI; Constitutional: Negative for fever and chills. ; ; Eyes: Negative for eye pain, redness and discharge. ; ; ENMT: Negative for ear pain, hoarseness, nasal congestion, sinus pressure and sore throat. ; ; Cardiovascular: Negative for palpitations, diaphoresis, and peripheral edema. ; ; Respiratory: +cough, wheezing, SOB, CP. Negative for stridor. ; ; Gastrointestinal: Negative for nausea, vomiting, diarrhea, abdominal pain, blood in stool, hematemesis, jaundice and rectal bleeding. . ; ; Genitourinary: Negative for dysuria, flank pain and hematuria. ; ; Musculoskeletal: Negative for back pain and neck pain. Negative for swelling and trauma.; ; Skin: Negative for pruritus, rash, abrasions, blisters, bruising and skin lesion.; ; Neuro: Negative for headache, lightheadedness and neck stiffness. Negative for weakness, altered level of consciousness, altered mental status, extremity weakness, paresthesias, involuntary movement, seizure and syncope.        Physical Exam Updated Vital Signs BP 134/92 (BP Location: Right Arm)   Pulse 65   Temp 97.4 F (36.3 C) (Oral)   Resp 16   Ht 5\' 8"  (1.727 m)   Wt 238 lb (108 kg)   SpO2 95%   BMI 36.19 kg/m   Physical Exam 1954: Physical examination:  Nursing notes reviewed; Vital signs and O2 SAT reviewed;  Constitutional: Well developed, Well nourished, Well hydrated, Uncomfortable appearing.;; Head:  Normocephalic, atraumatic; Eyes: EOMI, PERRL, No scleral  icterus; ENMT: Mouth and pharynx normal, Mucous membranes moist; Neck: Supple, Full range of motion, No lymphadenopathy; Cardiovascular: Regular rate and rhythm, No gallop; Respiratory: Breath sounds diminished & equal bilaterally, insp/exp wheezes bilat. Occasional audible wheezing.  Speaking quietly in short sentences, sitting upright.; Chest: Nontender, Movement normal; Abdomen: Soft, Nontender, Nondistended, Normal bowel sounds; Genitourinary: No CVA tenderness; Extremities: Pulses normal, No tenderness, No edema, No calf edema or asymmetry.; Neuro: AA&Ox3, Major CN grossly intact.  Speech clear. No gross focal motor or sensory deficits in extremities.; Skin: Color normal, Warm, Dry.   ED Treatments / Results  Labs (all labs ordered are listed, but only abnormal results are displayed)   EKG  EKG Interpretation  Date/Time:  Tuesday November 27 2016 19:48:25 EST Ventricular Rate:  71 PR Interval:    QRS Duration: 103 QT Interval:  412 QTC Calculation: 448 R Axis:   -43  Text Interpretation:  Sinus or ectopic atrial rhythm Prolonged PR interval Left axis deviation Left ventricular hypertrophy Inferior infarct, old Anterior Q waves, possibly due to LVH When compared with ECG of 07/02/2016 No significant change was found Confirmed by [facility]  MD, Nicholos Johns [phone]) on 11/27/2016 8:26:06 PM       Radiology  Procedures Procedures (including critical care time)  Medications Ordered in ED Medications  albuterol (PROVENTIL,VENTOLIN) solution continuous neb (not administered)  ipratropium (ATROVENT) nebulizer solution 1 mg (not administered)  methylPREDNISolone sodium succinate (SOLU-MEDROL) 125 mg/2 mL injection 125 mg (not administered)     Initial Impression / Assessment and Plan / ED Course  I have reviewed the triage vital signs and the nursing notes.  Pertinent labs & imaging results that were available during my care of the patient were reviewed by me and considered in my medical  decision making (see chart for details).  MDM Reviewed: previous chart, nursing note and vitals Reviewed previous: labs and ECG Interpretation: labs, ECG and x-ray Total time providing critical care: 30-74 minutes. This excludes time spent performing separately reportable procedures and services. Consults: admitting MD   CRITICAL CARE Performed by: Laray Anger Total critical care time: 35 minutes Critical care time was exclusive of separately billable procedures and treating other patients. Critical care was necessary to treat or prevent imminent or life-threatening deterioration. Critical care was time spent personally by me on the following activities: development of treatment plan with patient and/or surrogate as well as nursing, discussions with consultants, evaluation of patient's response to treatment, examination of patient, obtaining history from patient or surrogate, ordering and performing treatments and interventions, ordering and review of laboratory studies, ordering and review of radiographic studies, pulse oximetry and re-evaluation of patient's condition.    Labs Reviewed  BASIC METABOLIC PANEL - Abnormal; Notable for the following:       Result Value   Potassium 2.5 (*)    Chloride 97 (*)    CO2 36 (*)    Glucose, Bld 133 (*)    Creatinine, Ser 1.26 (*)    All other components within normal limits  CBC  BRAIN NATRIURETIC PEPTIDE  I-STAT TROPOININ, ED    Dg Chest 2 View Result Date: 11/27/2016 CLINICAL DATA:  Mid chest pain and productive cough with dyspnea times 2-3 days EXAM: CHEST  2 VIEW COMPARISON:  07/03/2016 CXR FINDINGS: Heart size is normal. Tortuous thoracic aorta with atherosclerosis, unchanged in appearance. Chronic elevation of right hemidiaphragm with eventration and right lower lobe posterior atelectasis. No overt pulmonary edema, effusion or pneumothorax. Degenerative changes are seen along the dorsal spine. Shunt catheter projects over the  right hemithorax. IMPRESSION: Chronic elevation of the right hemidiaphragm with adjacent atelectasis. Aortic atherosclerosis. No active disease. Electronically Signed   By: Tollie Eth M.D.   On: 11/27/2016 20:37     2310:  On arrival: pt sitting upright, Sats 95 % O2 2L N/C, lungs diminished with wheezing. IV solumedrol and hour long neb started. After neb: pt appears more comfortable at rest, Sats 95 % on O2 2L N/C, lungs continue diminished with wheezing. Pt moved around on stretcher with O2 Sats dropping to 91 % on O2 2L N/C with pt c/o increasing SOB, with increasing HR and RR. Doubt PE as cause for symptoms with low risk Wells.  Doubt ACS as cause for symptoms with normal troponin and unchanged EKG from previous after 3 days of constant symptoms.  Potassium repleted IV and PO. T/C to [facility] Dr. Arlean Hopping,  case discussed, including:  HPI, pertinent PM/SHx, VS/PE, dx testing, ED course and treatment:  Agreeable to admit.      Final Clinical Impressions(s) / ED Diagnoses   Final diagnoses:  None    New Prescriptions New Prescriptions   No medications on file      Samuel Jester, DO 11/30/16 1314

## 2016-11-27 NOTE — ED Triage Notes (Signed)
Pt brought in by [facility] for c/o sob and chest pain that started x 3 days ago; pt states he is coughing up yellow sputum

## 2016-11-27 NOTE — ED Notes (Signed)
CRITICAL VALUE ALERT  Critical value received:  Potassium 2.5  Date of notification:  11/27/2016  Time of notification:  2031  Critical value read back: yes  Nurse who received alert:  Casimiro NeedleMichael, RN  MD notified (1st page):  [place]  Time of first page:  [phone]

## 2016-11-28 ENCOUNTER — Encounter (HOSPITAL_COMMUNITY): Payer: Self-pay | Admitting: Emergency Medicine

## 2016-11-28 DIAGNOSIS — R071 Chest pain on breathing: Secondary | ICD-10-CM

## 2016-11-28 LAB — GLUCOSE, CAPILLARY
GLUCOSE-CAPILLARY: 203 mg/dL — AB (ref 65–99)
GLUCOSE-CAPILLARY: 154 mg/dL — AB (ref 65–99)
GLUCOSE-CAPILLARY: 215 mg/dL — AB (ref 65–99)

## 2016-11-28 LAB — URINALYSIS, ROUTINE W REFLEX MICROSCOPIC
BACTERIA UA: NONE SEEN
BILIRUBIN URINE: NEGATIVE
Glucose, UA: >=500 mg/dL — AB
Hgb urine dipstick: NEGATIVE
KETONES UR: 5 mg/dL — AB
Leukocytes, UA: NEGATIVE
NITRITE: NEGATIVE
Protein, ur: NEGATIVE mg/dL
SPECIFIC GRAVITY, URINE: 1.013 (ref 1.005–1.030)
pH: 5 (ref 5.0–8.0)

## 2016-11-28 LAB — CBC
HCT: 42.1 % (ref 39.0–52.0)
Hemoglobin: 13.9 g/dL (ref 13.0–17.0)
MCH: 30.5 pg (ref 26.0–34.0)
MCHC: 33 g/dL (ref 30.0–36.0)
MCV: 92.5 fL (ref 78.0–100.0)
PLATELETS: 267 10*3/uL (ref 150–400)
RBC: 4.55 MIL/uL (ref 4.22–5.81)
RDW: 12.9 % (ref 11.5–15.5)
WBC: 9.1 10*3/uL (ref 4.0–10.5)

## 2016-11-28 LAB — BASIC METABOLIC PANEL
ANION GAP: 14 (ref 5–15)
ANION GAP: 8 (ref 5–15)
BUN: 18 mg/dL (ref 6–20)
BUN: 17 mg/dL (ref 6–20)
CALCIUM: 9.2 mg/dL (ref 8.9–10.3)
CALCIUM: 9.1 mg/dL (ref 8.9–10.3)
CO2: 26 mmol/L (ref 22–32)
CO2: 28 mmol/L (ref 22–32)
Chloride: 97 mmol/L — ABNORMAL LOW (ref 101–111)
Chloride: 100 mmol/L — ABNORMAL LOW (ref 101–111)
Creatinine, Ser: 1.42 mg/dL — ABNORMAL HIGH (ref 0.61–1.24)
Creatinine, Ser: 1.21 mg/dL (ref 0.61–1.24)
GFR calc non Af Amer: 52 mL/min — ABNORMAL LOW (ref >60)
Glucose, Bld: 302 mg/dL — ABNORMAL HIGH (ref 65–99)
Glucose, Bld: 196 mg/dL — ABNORMAL HIGH (ref 65–99)
Potassium: 2.4 mmol/L — CL (ref 3.5–5.1)
Potassium: 3.7 mmol/L (ref 3.5–5.1)
SODIUM: 137 mmol/L (ref 135–145)
SODIUM: 136 mmol/L (ref 135–145)

## 2016-11-28 LAB — MRSA PCR SCREENING: MRSA by PCR: NEGATIVE

## 2016-11-28 LAB — TROPONIN I

## 2016-11-28 MED ORDER — ONDANSETRON HCL 4 MG/2ML IJ SOLN: 4.0000 mg | Freq: Four times a day (QID) | INTRAMUSCULAR | Status: DC | PRN

## 2016-11-28 MED ORDER — METHYLPREDNISOLONE SODIUM SUCC 125 MG IJ SOLR
60.0000 mg | Freq: Four times a day (QID) | INTRAMUSCULAR | Status: AC
  Administered 2016-11-28 – 2016-11-30 (×11): 60 mg via INTRAVENOUS
  Filled 2016-11-28 (×11): qty 2

## 2016-11-28 MED ORDER — BISACODYL 10 MG RE SUPP: 10.0000 mg | Freq: Every day | RECTAL | Status: DC | PRN

## 2016-11-28 MED ORDER — INSULIN ASPART 100 UNIT/ML ~~LOC~~ SOLN
0.0000 [IU] | Freq: Every day | SUBCUTANEOUS | Status: DC
  Administered 2016-11-28: 2 [IU] via SUBCUTANEOUS
  Administered 2016-11-29: 3 [IU] via SUBCUTANEOUS
  Administered 2016-11-30: 2 [IU] via SUBCUTANEOUS

## 2016-11-28 MED ORDER — ENOXAPARIN SODIUM 40 MG/0.4ML ~~LOC~~ SOLN
40.0000 mg | SUBCUTANEOUS | Status: DC
  Administered 2016-11-28 – 2016-12-01 (×4): 40 mg via SUBCUTANEOUS
  Filled 2016-11-28 (×4): qty 0.4

## 2016-11-28 MED ORDER — ONDANSETRON HCL 4 MG PO TABS: 4.0000 mg | ORAL_TABLET | Freq: Four times a day (QID) | ORAL | Status: DC | PRN

## 2016-11-28 MED ORDER — INSULIN ASPART 100 UNIT/ML ~~LOC~~ SOLN
0.0000 [IU] | Freq: Three times a day (TID) | SUBCUTANEOUS | Status: DC
  Administered 2016-11-28: 2 [IU] via SUBCUTANEOUS
  Administered 2016-11-28: 3 [IU] via SUBCUTANEOUS
  Administered 2016-11-29 (×3): 2 [IU] via SUBCUTANEOUS
  Administered 2016-11-30 – 2016-12-01 (×4): 3 [IU] via SUBCUTANEOUS
  Administered 2016-12-01: 2 [IU] via SUBCUTANEOUS

## 2016-11-28 MED ORDER — ALBUTEROL SULFATE (2.5 MG/3ML) 0.083% IN NEBU
2.5000 mg | INHALATION_SOLUTION | RESPIRATORY_TRACT | Status: DC | PRN
  Administered 2016-12-01: 2.5 mg via RESPIRATORY_TRACT
  Filled 2016-11-28 (×2): qty 3

## 2016-11-28 MED ORDER — AMLODIPINE BESYLATE 5 MG PO TABS
10.0000 mg | ORAL_TABLET | Freq: Every day | ORAL | Status: DC
  Administered 2016-11-28 – 2016-12-01 (×4): 10 mg via ORAL
  Filled 2016-11-28 (×4): qty 2

## 2016-11-28 MED ORDER — LORAZEPAM 2 MG/ML IJ SOLN
0.5000 mg | Freq: Once | INTRAMUSCULAR | Status: AC
  Administered 2016-11-28: 0.5 mg via INTRAVENOUS

## 2016-11-28 MED ORDER — POTASSIUM CHLORIDE CRYS ER 20 MEQ PO TBCR
40.0000 meq | EXTENDED_RELEASE_TABLET | Freq: Every day | ORAL | Status: DC
  Administered 2016-11-28 – 2016-12-01 (×4): 40 meq via ORAL
  Filled 2016-11-28 (×4): qty 2

## 2016-11-28 MED ORDER — POTASSIUM CHLORIDE 20 MEQ PO PACK
PACK | ORAL | Status: AC
  Filled 2016-11-28: qty 2

## 2016-11-28 MED ORDER — POTASSIUM CHLORIDE IN NACL 20-0.45 MEQ/L-% IV SOLN
INTRAVENOUS | Status: DC
  Administered 2016-11-28 (×2): via INTRAVENOUS
  Filled 2016-11-28 (×3): qty 1000

## 2016-11-28 MED ORDER — POTASSIUM CHLORIDE 10 MEQ/100ML IV SOLN
10.0000 meq | INTRAVENOUS | Status: AC
  Administered 2016-11-28 (×4): 10 meq via INTRAVENOUS
  Filled 2016-11-28 (×3): qty 100

## 2016-11-28 MED ORDER — POTASSIUM CHLORIDE IN NACL 20-0.45 MEQ/L-% IV SOLN
INTRAVENOUS | Status: AC
  Filled 2016-11-28: qty 1000

## 2016-11-28 MED ORDER — MAGNESIUM SULFATE 2 GM/50ML IV SOLN
2.0000 g | Freq: Once | INTRAVENOUS | Status: AC
  Administered 2016-11-28: 2 g via INTRAVENOUS
  Filled 2016-11-28: qty 50

## 2016-11-28 MED ORDER — OXYCODONE-ACETAMINOPHEN 5-325 MG PO TABS
1.0000 | ORAL_TABLET | Freq: Four times a day (QID) | ORAL | Status: DC | PRN
  Administered 2016-11-28 – 2016-11-29 (×2): 2 via ORAL
  Filled 2016-11-28 (×2): qty 2

## 2016-11-28 NOTE — Progress Notes (Addendum)
Inpatient Diabetes Program Recommendations  AACE/ADA: New Consensus Statement on Inpatient Glycemic Control (2015)  Target Ranges:  Prepandial:   less than 140 mg/dL      Peak postprandial:   less than 180 mg/dL (1-2 hours)      Critically ill patients:  140 - 180 mg/dL   Results for [name], Axavier (MRN [id number]) as of 11/28/2016 08:23  Ref. Range 11/27/2016 20:01 11/28/2016 04:20  Glucose Latest Ref Range: 65 - 99 mg/dL 332133 (H) 951302 (H)   Review of Glycemic Control  Diabetes history: DM2 hx (per H&P) Outpatient Diabetes medications: NA Current orders for Inpatient glycemic control: None  Inpatient Diabetes Program Recommendations: Correction (SSI): While inpatient and ordered steroids, please consider ordering CBGs with Novolog correction scale.  Thanks, Orlando PennerMarie Annabella Elford, RN, MSN, CDE Diabetes Coordinator Inpatient Diabetes Program [phone] (Team Pager from 8am to 5pm)

## 2016-11-28 NOTE — Progress Notes (Signed)
Critical potassium called by lab of 2.4 MD made aware via text page

## 2016-11-28 NOTE — Progress Notes (Signed)
PROGRESS NOTE    [name]  ZOX:[id number] DOB: 1956/02/04 DOA: 11/27/2016 PCP: No PCP Per Patient    Brief Narrative:  [name] is a 61 y.o. male with history of COPD, chronic HA s/p VP shunt for hydrocephalus, and HTN presents [facility] apparently from a jail[facility] prison with SOB and chest pain.  EKG and CXR in ED are negative and trop is negative.  Pt was wheezing and SOB , rec'd IV steroids and 1 hr long neb.  Still SOB and wheezing.  Coughing up yellow sputum.  Some anterior CP.  No fevers or chills.    Denies abd pain, n/v/d, no urinary c/o's.    Assessment & Plan:   Principal Problem:   Acute chronic obstructive pulmonary disease with respiratory failure (HCC) Active Problems:   HTN (hypertension)   Headache, chronic daily   S/P VP shunt   Hypokalemia   Dyspnea  Acute on chronic resp failure  - due to COPD exacerbation - increased work of breathing - currently on 2L  -  No fever or leukocytosis - Continue IV steroids (add Novolog sliding scale as well as CBG) - IV ativan - s/p 2g IV Mg - becomes breathless talking in phrases - continue monitoring in SDU    Chest pain - repeat troponin - telemetry stripe showing tachycardia and elongated PR interval - continue telemetry and await troponin  Chronic HA sp VP shunt for hydrocephalus  - in 2005 per pt  HTN  - cont norvasc  Hist diast CHF  - CXR clear and no vol overload on exam - BNP of 15  BPH - urinating normally  COPD/ asthma - as above   Hypokalemia  - unclear etiology - receiving both PO and IV replacement - repeat K this am essentially unchanged - will repeat BMP at 11:30 - 2g Mg given at time of admission - is on a thiazide diuretic - continue telemetry  AKI - increase in creatinine of >0.25 from 4 months ago - will continue IVF and monitor - poor PO intake  Plan - as above   DVT prophylaxis: lovenox Code Status: Full Code Family Communication: no family bedside Disposition  Plan: back to correctional facility when stable   Consultants:   none  Procedures:   none  Antimicrobials:   none    Subjective: Patient reports he feels his breathing is just starting to begin to improve.  Mentions he has a pain in one spot of his head and in his chest that does not radiate and is only present when he takes a deep breath occasionally.  Slept a little last night.  Asking for pain medication.  Objective: Vitals:   11/28/16 0300 11/28/16 0400 11/28/16 0500 11/28/16 0718  BP:      Pulse: 95     Resp: 17     Temp:  98 F (36.7 C)  97.9 F (36.6 C)  TempSrc:  Oral  Oral  SpO2: 92%     Weight:   106.5 kg (234 lb 12.6 oz)   Height:        Intake/Output Summary (Last 24 hours) at 11/28/16 0734 Last data filed at 11/28/16 0151  Gross per 24 hour  Intake               50 ml  Output                0 ml  Net  50 ml   Filed Weights   11/27/16 1944 11/28/16 0112 11/28/16 0500  Weight: 108 kg (238 lb) 106.5 kg (234 lb 12.6 oz) 106.5 kg (234 lb 12.6 oz)    Examination:  General exam: Appears calm and comfortable  Respiratory system: increase respiratory effort but no accessory muscle use, tight in lower lung fields bilaterally and expiratory wheezing in upper lung fields bilaterally, nasal cannula in place Cardiovascular system: S1 & S2 heard, RRR. No JVD, murmurs, rubs, gallops or clicks. No pedal edema. Gastrointestinal system: Abdomen is nondistended, soft and nontender. No organomegaly or masses felt. Normal bowel sounds heard. Central nervous system: Alert and oriented. No focal neurological deficits. Extremities: Symmetric 5 x 5 power. Skin: No rashes, lesions or ulcers Psychiatry: Judgement and insight appear normal. Mood & affect appropriate.     Data Reviewed: I have personally reviewed following labs and imaging studies  CBC:  Recent Labs Lab 11/27/16 2001 11/28/16 0420  WBC 8.6 9.1  HGB 15.1 13.9  HCT 45.0 42.1  MCV 92.6  92.5  PLT 254 267   Basic Metabolic Panel:  Recent Labs Lab 11/27/16 2001 11/28/16 0420  NA 140 137  K 2.5* 2.4*  CL 97* 97*  CO2 36* 26  GLUCOSE 133* 302*  BUN 17 18  CREATININE 1.26* 1.42*  CALCIUM 9.6 9.2   GFR: Estimated Creatinine Clearance: 65.4 mL/min (by C-G formula based on SCr of 1.42 mg/dL (H)). Liver Function Tests: No results for input(s): AST, ALT, ALKPHOS, BILITOT, PROT, ALBUMIN in the last 168 hours. No results for input(s): LIPASE, AMYLASE in the last 168 hours. No results for input(s): AMMONIA in the last 168 hours. Coagulation Profile: No results for input(s): INR, PROTIME in the last 168 hours. Cardiac Enzymes: No results for input(s): CKTOTAL, CKMB, CKMBINDEX, TROPONINI in the last 168 hours. BNP (last 3 results) No results for input(s): PROBNP in the last 8760 hours. HbA1C: No results for input(s): HGBA1C in the last 72 hours. CBG: No results for input(s): GLUCAP in the last 168 hours. Lipid Profile: No results for input(s): CHOL, HDL, LDLCALC, TRIG, CHOLHDL, LDLDIRECT in the last 72 hours. Thyroid Function Tests: No results for input(s): TSH, T4TOTAL, FREET4, T3FREE, THYROIDAB in the last 72 hours. Anemia Panel: No results for input(s): VITAMINB12, FOLATE, FERRITIN, TIBC, IRON, RETICCTPCT in the last 72 hours. Sepsis Labs: No results for input(s): PROCALCITON, LATICACIDVEN in the last 168 hours.  No results found for this or any previous visit (from the past 240 hour(s)).       Radiology Studies: Dg Chest 2 View  Result Date: 11/27/2016 CLINICAL DATA:  Mid chest pain and productive cough with dyspnea times 2-3 days EXAM: CHEST  2 VIEW COMPARISON:  07/03/2016 CXR FINDINGS: Heart size is normal. Tortuous thoracic aorta with atherosclerosis, unchanged in appearance. Chronic elevation of right hemidiaphragm with eventration and right lower lobe posterior atelectasis. No overt pulmonary edema, effusion or pneumothorax. Degenerative changes are seen  along the dorsal spine. Shunt catheter projects over the right hemithorax. IMPRESSION: Chronic elevation of the right hemidiaphragm with adjacent atelectasis. Aortic atherosclerosis. No active disease. Electronically Signed   By: Tollie Ethavid  Kwon M.D.   On: 11/27/2016 20:37        Scheduled Meds: . amLODipine  10 mg Oral Daily  . enoxaparin (LOVENOX) injection  40 mg Subcutaneous Q24H  . ipratropium-albuterol  3 mL Nebulization Q6H  . ipratropium-albuterol      . methylPREDNISolone (SOLU-MEDROL) injection  60 mg Intravenous Q6H  . potassium  chloride  40 mEq Oral TID  . potassium chloride  10 mEq Intravenous Q1 Hr x 4  . potassium chloride  40 mEq Oral Daily   Continuous Infusions: . 0.45 % NaCl with KCl 20 mEq / L 75 mL/hr at 11/28/16 0150     LOS: 1 day    Time spent: 35 minutes    Katrinka Blazing, MD [facility] Pager [phone]  If 7PM-7AM, please contact night-coverage [email or web address] Password [facility] 11/28/2016, 7:34 AM

## 2016-11-29 LAB — BASIC METABOLIC PANEL
ANION GAP: 7 (ref 5–15)
BUN: 19 mg/dL (ref 6–20)
CO2: 28 mmol/L (ref 22–32)
Calcium: 8.7 mg/dL — ABNORMAL LOW (ref 8.9–10.3)
Chloride: 101 mmol/L (ref 101–111)
Creatinine, Ser: 1.14 mg/dL (ref 0.61–1.24)
GLUCOSE: 184 mg/dL — AB (ref 65–99)
POTASSIUM: 3.8 mmol/L (ref 3.5–5.1)
Sodium: 136 mmol/L (ref 135–145)

## 2016-11-29 LAB — GLUCOSE, CAPILLARY
GLUCOSE-CAPILLARY: 197 mg/dL — AB (ref 65–99)
Glucose-Capillary: 177 mg/dL — ABNORMAL HIGH (ref 65–99)
Glucose-Capillary: 191 mg/dL — ABNORMAL HIGH (ref 65–99)
Glucose-Capillary: 266 mg/dL — ABNORMAL HIGH (ref 65–99)

## 2016-11-29 MED ORDER — OXYCODONE-ACETAMINOPHEN 5-325 MG PO TABS
1.0000 | ORAL_TABLET | Freq: Three times a day (TID) | ORAL | Status: DC | PRN
  Administered 2016-11-29 – 2016-12-01 (×6): 1 via ORAL
  Filled 2016-11-29 (×6): qty 1

## 2016-11-29 NOTE — Progress Notes (Signed)
PROGRESS NOTE    [name]  ZOX:[id number] DOB: 1956-07-05 DOA: 11/27/2016 PCP: No PCP Per Patient    Brief Narrative:  [name] is a 61 y.o. male with history of COPD, chronic HA s/p VP shunt for hydrocephalus, and HTN [facility] with SOB and chest pain.  EKG and CXR in ED are negative and trop is negative.  Pt was wheezing and SOB , rec'd IV steroids and 1 hr long neb.  Still SOB and wheezing.  Coughing up yellow sputum.  Some anterior CP.  No fevers or chills.    Denies abd pain, n/v/d, no urinary c/o's.    Assessment & Plan:   Principal Problem:   Acute chronic obstructive pulmonary disease with respiratory failure (HCC) Active Problems:   HTN (hypertension)   Headache, chronic daily   S/P VP shunt   Hypokalemia   Dyspnea  Acute on chronic resp failure  - due to COPD exacerbation - currently on 4L [place] -  No fever or leukocytosis - Continue IV methylprednisolone 60mg  q6h (add Novolog sliding scale as well as CBG) - IV ativan - s/p 2g IV Mg - able to communicate in sentences today  Chest pain - troponin negative - no pain concerns voiced today - telemetry strip showing occasional tachycardia  Chronic HA sp VP shunt for hydrocephalus  - in 2005 per pt  HTN  - cont norvasc  Hist diast CHF  - CXR clear and no vol overload on exam - BNP of 15 - d/c IVF  BPH - urinating normally  COPD/ asthma - as above   Hypokalemia  - unclear etiology - receiving both PO and IV replacement - K yesterday afternoon normal - repeat BMP from this am pending - 2g Mg given at time of admission - is on a thiazide diuretic - telemetry based on BMP  AKI - increase in creatinine of >0.25 from 4 months ago - improved with IVF - likely a mixture of prerenal azotemia - repeat BMP pending  Plan - as above   DVT prophylaxis: lovenox Code Status: Full Code Family Communication: no family bedside Disposition Plan: back to  correctional facility when stable likely in 2-3 days; transfer to medical floor today   Consultants:   none  Procedures:   none  Antimicrobials:   none    Subjective: Patient waiting for breakfast.  Reports that he did not sleep much last night and was tossing and turning most of the night.  Says his breathing feels worse today than yesterday but that yesterday he didn't do much.  Says he ate relatively well yesterday afternoon and evening.  Denies chest pain.  Talks in full sentences.  Objective: Vitals:   11/29/16 0500 11/29/16 0600 11/29/16 0700 11/29/16 0716  BP: 124/84 128/70 (!) 147/97   Pulse: 86 84 92 (!) 101  Resp: 20 (!) 21 (!) 28 20  Temp: 97.9 F (36.6 C)   97.7 F (36.5 C)  TempSrc: Oral   Oral  SpO2: 90% 92% 92% 91%  Weight: 109.1 kg (240 lb 8.4 oz)     Height:        Intake/Output Summary (Last 24 hours) at 11/29/16 0814 Last data filed at 11/29/16 0500  Gross per 24 hour  Intake          2206.25 ml  Output              501 ml  Net  1705.25 ml   Filed Weights   11/28/16 0112 11/28/16 0500 11/29/16 0500  Weight: 106.5 kg (234 lb 12.6 oz) 106.5 kg (234 lb 12.6 oz) 109.1 kg (240 lb 8.4 oz)    Examination:  General exam: Appears calm and comfortable  Respiratory system: increase respiratory effort but no accessory muscle use, expiratory wheezes in all lung fields, better air movement today compared to yesterday. Nasal cannula in place Cardiovascular system: S1 & S2 heard, RRR. No JVD, murmurs, rubs, gallops or clicks. No pedal edema. Gastrointestinal system: Abdomen is nondistended, soft and nontender. No organomegaly or masses felt. Normal bowel sounds heard. Central nervous system: Alert and oriented. No focal neurological deficits. Extremities: Symmetric 5 x 5 power. Skin: No rashes, lesions or ulcers Psychiatry: Judgement and insight appear normal. Mood & affect appropriate.     Data Reviewed: I have personally reviewed following labs  and imaging studies  CBC:  Recent Labs Lab 11/27/16 2001 11/28/16 0420  WBC 8.6 9.1  HGB 15.1 13.9  HCT 45.0 42.1  MCV 92.6 92.5  PLT 254 267   Basic Metabolic Panel:  Recent Labs Lab 11/27/16 2001 11/28/16 0420 11/28/16 1137  NA 140 137 136  K 2.5* 2.4* 3.7  CL 97* 97* 100*  CO2 36* 26 28  GLUCOSE 133* 302* 196*  BUN 17 18 17   CREATININE 1.26* 1.42* 1.21  CALCIUM 9.6 9.2 9.1   GFR: Estimated Creatinine Clearance: 77.8 mL/min (by C-G formula based on SCr of 1.21 mg/dL). Liver Function Tests: No results for input(s): AST, ALT, ALKPHOS, BILITOT, PROT, ALBUMIN in the last 168 hours. No results for input(s): LIPASE, AMYLASE in the last 168 hours. No results for input(s): AMMONIA in the last 168 hours. Coagulation Profile: No results for input(s): INR, PROTIME in the last 168 hours. Cardiac Enzymes:  Recent Labs Lab 11/28/16 0420  TROPONINI <0.03   BNP (last 3 results) No results for input(s): PROBNP in the last 8760 hours. HbA1C: No results for input(s): HGBA1C in the last 72 hours. CBG:  Recent Labs Lab 11/28/16 1116 11/28/16 1624 11/28/16 2113 11/29/16 0715  GLUCAP 203* 154* 215* 177*   Lipid Profile: No results for input(s): CHOL, HDL, LDLCALC, TRIG, CHOLHDL, LDLDIRECT in the last 72 hours. Thyroid Function Tests: No results for input(s): TSH, T4TOTAL, FREET4, T3FREE, THYROIDAB in the last 72 hours. Anemia Panel: No results for input(s): VITAMINB12, FOLATE, FERRITIN, TIBC, IRON, RETICCTPCT in the last 72 hours. Sepsis Labs: No results for input(s): PROCALCITON, LATICACIDVEN in the last 168 hours.  Recent Results (from the past 240 hour(s))  MRSA PCR Screening     Status: None   Collection Time: 11/28/16 12:59 AM  Result Value Ref Range Status   MRSA by PCR NEGATIVE NEGATIVE Final    Comment:        The GeneXpert MRSA Assay (FDA approved for NASAL specimens only), is one component of a comprehensive MRSA colonization surveillance program. It  is not intended to diagnose MRSA infection nor to guide or monitor treatment for MRSA infections.          Radiology Studies: Dg Chest 2 View  Result Date: 11/27/2016 CLINICAL DATA:  Mid chest pain and productive cough with dyspnea times 2-3 days EXAM: CHEST  2 VIEW COMPARISON:  07/03/2016 CXR FINDINGS: Heart size is normal. Tortuous thoracic aorta with atherosclerosis, unchanged in appearance. Chronic elevation of right hemidiaphragm with eventration and right lower lobe posterior atelectasis. No overt pulmonary edema, effusion or pneumothorax. Degenerative changes are seen along  the dorsal spine. Shunt catheter projects over the right hemithorax. IMPRESSION: Chronic elevation of the right hemidiaphragm with adjacent atelectasis. Aortic atherosclerosis. No active disease. Electronically Signed   By: Tollie Ethavid  Kwon M.D.   On: 11/27/2016 20:37        Scheduled Meds: . amLODipine  10 mg Oral Daily  . enoxaparin (LOVENOX) injection  40 mg Subcutaneous Q24H  . insulin aspart  0-5 Units Subcutaneous QHS  . insulin aspart  0-9 Units Subcutaneous TID WC  . ipratropium-albuterol  3 mL Nebulization Q6H  . methylPREDNISolone (SOLU-MEDROL) injection  60 mg Intravenous Q6H  . potassium chloride  40 mEq Oral Daily   Continuous Infusions: . 0.45 % NaCl with KCl 20 mEq / L 75 mL/hr at 11/28/16 2000     LOS: 2 days    Time spent: 30 minutes    Katrinka BlazingAlex U Kadolph, MD [facility] Pager [phone]  If 7PM-7AM, please contact night-coverage [email or web address] Password [email or web address] 11/29/2016, 8:14 AM

## 2016-11-29 NOTE — Care Management Note (Signed)
Case Management Note  Patient Details  Name: [name] MRN: [id number] Date of Birth: 11/24/1955  Subjective/Objective:                  Adm with acute COPD exacerbation[facility] Currently on [facility]L, nursing to attempt wean.   Action/Plan: CM following for needs.    Expected Discharge Date:  12/01/16               Expected Discharge Plan:  [facility]rections Facility  In-House Referral:     Discharge planning Services  CM Consult  Post Acute Care Choice:    Choice offered to:     DME Arranged:    DME Agency:     HH Arranged:    HH Agency:     Status of Service:  In process, will continue to follow  If discussed at Long Length of Stay Meetings, dates discussed:    Additional Comments:  Mallika Sanmiguel, Chrystine OilerSharley Diane, RN 11/29/2016, 12:13 PM

## 2016-11-30 ENCOUNTER — Other Ambulatory Visit: Payer: Self-pay

## 2016-11-30 ENCOUNTER — Inpatient Hospital Stay (HOSPITAL_COMMUNITY)

## 2016-11-30 LAB — GLUCOSE, CAPILLARY
GLUCOSE-CAPILLARY: 237 mg/dL — AB (ref 65–99)
GLUCOSE-CAPILLARY: 222 mg/dL — AB (ref 65–99)
Glucose-Capillary: 210 mg/dL — ABNORMAL HIGH (ref 65–99)
Glucose-Capillary: 239 mg/dL — ABNORMAL HIGH (ref 65–99)

## 2016-11-30 LAB — TROPONIN I: Troponin I: <0.03 ng/mL (ref <0.03)

## 2016-11-30 MED ORDER — ASPIRIN 81 MG PO CHEW
81.0000 mg | CHEWABLE_TABLET | Freq: Once | ORAL | Status: AC
  Administered 2016-11-30: 81 mg via ORAL
  Filled 2016-11-30: qty 1

## 2016-11-30 MED ORDER — KETOROLAC TROMETHAMINE 15 MG/ML IJ SOLN
15.0000 mg | Freq: Once | INTRAMUSCULAR | Status: AC
  Administered 2016-11-30: 15 mg via INTRAVENOUS
  Filled 2016-11-30: qty 1

## 2016-11-30 MED ORDER — LIDOCAINE 5 % EX PTCH
1.0000 | MEDICATED_PATCH | CUTANEOUS | Status: DC
  Administered 2016-11-30 – 2016-12-01 (×2): 1 via TRANSDERMAL
  Filled 2016-11-30 (×2): qty 1

## 2016-11-30 MED ORDER — PREDNISONE 20 MG PO TABS
40.0000 mg | ORAL_TABLET | Freq: Every day | ORAL | Status: DC
  Administered 2016-12-01: 40 mg via ORAL
  Filled 2016-11-30 (×2): qty 2

## 2016-11-30 MED ORDER — INSULIN ASPART 100 UNIT/ML ~~LOC~~ SOLN
4.0000 [IU] | Freq: Three times a day (TID) | SUBCUTANEOUS | Status: DC
  Administered 2016-11-30 – 2016-12-01 (×4): 4 [IU] via SUBCUTANEOUS

## 2016-11-30 NOTE — Progress Notes (Signed)
Inpatient Diabetes Program Recommendations  AACE/ADA: New Consensus Statement on Inpatient Glycemic Control (2015)  Target Ranges:  Prepandial:   less than 140 mg/dL      Peak postprandial:   less than 180 mg/dL (1-2 hours)      Critically ill patients:  140 - 180 mg/dL  Results for [name], [name] (MRN [id number]) as of 11/30/2016 09:42  Ref. Range 11/29/2016 07:15 11/29/2016 11:08 11/29/2016 16:33 11/29/2016 21:29 11/30/2016 07:31  Glucose-Capillary Latest Ref Range: 65 - 99 mg/dL 409177 (H) 811197 (H) 914191 (H) 266 (H) 210 (H)    Review of Glycemic Control  Diabetes history: DM2 (per H&P) Outpatient Diabetes medications: None Current orders for Inpatient glycemic control: Novolog 0-9 units TID with meals, Novolog 0-5 units QHS  Inpatient Diabetes Program Recommendations: Insulin - Meal Coverage: If steroids are continued, please consider ordering Novolog 4 units TID with meals for meal coverage if patient eats at least 50% of meal.  Thanks, Orlando PennerMarie Adeleigh Barletta, RN, MSN, CDE Diabetes Coordinator Inpatient Diabetes Program [phone] (Team Pager from 8am to 5pm)

## 2016-11-30 NOTE — Progress Notes (Signed)
PROGRESS NOTE    [name]  ZOX:[id number] DOB: 06-08-1956 DOA: 11/27/2016 PCP: No PCP Per Patient    Brief Narrative:  [name] is a 61 y.o. male with history of COPD, chronic HA s/p VP shunt for hydrocephalus, and HTN presents brought apparently from a jail/ prison with SOB and chest pain.  EKG and CXR in ED are negative and trop is negative.  Pt was wheezing and SOB , rec'd IV steroids and 1 hr long neb.  Still SOB and wheezing.  Coughing up yellow sputum.  Some anterior CP.  No fevers or chills.  IV steroids and scheduled breathing treatments were initiated.  Supplemental oxygen was slowly titrated as tolerated.    Assessment & Plan:   Principal Problem:   Acute chronic obstructive pulmonary disease with respiratory failure (HCC) Active Problems:   HTN (hypertension)   Headache, chronic daily   S/P VP shunt   Hypokalemia   Dyspnea  Acute on chronic resp failure  - due to COPD exacerbation - currently on 4L [place] -  No fever or leukocytosis - IV methylprednisolone 60mg  q6h to transition to PO steroids in am - continue CBGs, SSI and mealtime insulin ordered - IV ativan - s/p 2g IV Mg - able to communicate in sentences today  Chest pain - troponin negative - patient reports that his pain is occasional when he pushes on his sternum or when he takes a deep breath - will try lidocaine patch  Chronic HA sp VP shunt for hydrocephalus  - in 2005 per pt  HTN  - cont norvasc  Hist diast CHF  - CXR clear and no vol overload on exam - BNP of 15  BPH - urinating normally  COPD/ asthma - as above   Hypokalemia  - unclear etiology - PO replacement - 2g Mg given at time of admission - is on a thiazide diuretic - K yesterday of 3.8 - telemetry showing   AKI - increase in creatinine of >0.25 from 4 months ago - improved with IVF - likely a mixture of prerenal azotemia - resolved   DVT prophylaxis: lovenox Code Status: Full Code Family Communication: no  family bedside Disposition Plan: back to correctional facility when stable likely in 2-3 days   Consultants:   none  Procedures:   none  Antimicrobials:   none    Subjective: Patient sitting comfortably in bed when first evaluated- nasal cannula was off.  Upon entering and asking patient where nasal cannula was he reports that he forgot to put it back on.  Audible wheezing when talking.  Objective: Vitals:   11/29/16 2145 11/30/16 0236 11/30/16 0505 11/30/16 0725  BP: 124/74  (!) 146/89   Pulse: 93  87   Resp: 20  20   Temp: 97.7 F (36.5 C)  97.8 F (36.6 C)   TempSrc: Oral  Oral   SpO2: 95% 93% 95% 93%  Weight:   110.1 kg (242 lb 11.2 oz)   Height:        Intake/Output Summary (Last 24 hours) at 11/30/16 0955 Last data filed at 11/30/16 0900  Gross per 24 hour  Intake              720 ml  Output                0 ml  Net              720 ml   Filed Weights   11/28/16 0500 11/29/16  0500 11/30/16 0505  Weight: 106.5 kg (234 lb 12.6 oz) 109.1 kg (240 lb 8.4 oz) 110.1 kg (242 lb 11.2 oz)    Examination:  General exam: Appears calm and comfortable  Respiratory system: Nasal cannula in place. Regular respiratory effort, upper airway wheezing but no wheezing appreciated on inspiration or expiration during exam.   Cardiovascular system: S1 & S2 heard, RRR. No JVD, murmurs, rubs, gallops or clicks. No pedal edema. Gastrointestinal system: Abdomen is nondistended, soft and nontender. No organomegaly or masses felt. Normal bowel sounds heard. Central nervous system: Alert and oriented. No focal neurological deficits. Extremities: Symmetric 5 x 5 power. Skin: No rashes, lesions or ulcers Psychiatry: Judgement and insight appear normal. Mood & affect appropriate.     Data Reviewed: I have personally reviewed following labs and imaging studies  CBC:  Recent Labs Lab 11/27/16 2001 11/28/16 0420  WBC 8.6 9.1  HGB 15.1 13.9  HCT 45.0 42.1  MCV 92.6 92.5  PLT  254 267   Basic Metabolic Panel:  Recent Labs Lab 11/27/16 2001 11/28/16 0420 11/28/16 1137 11/29/16 0802  NA 140 137 136 136  K 2.5* 2.4* 3.7 3.8  CL 97* 97* 100* 101  CO2 36* 26 28 28   GLUCOSE 133* 302* 196* 184*  BUN 17 18 17 19   CREATININE 1.26* 1.42* 1.21 1.14  CALCIUM 9.6 9.2 9.1 8.7*   GFR: Estimated Creatinine Clearance: 82.9 mL/min (by C-G formula based on SCr of 1.14 mg/dL). Liver Function Tests: No results for input(s): AST, ALT, ALKPHOS, BILITOT, PROT, ALBUMIN in the last 168 hours. No results for input(s): LIPASE, AMYLASE in the last 168 hours. No results for input(s): AMMONIA in the last 168 hours. Coagulation Profile: No results for input(s): INR, PROTIME in the last 168 hours. Cardiac Enzymes:  Recent Labs Lab 11/28/16 0420  TROPONINI <0.03   BNP (last 3 results) No results for input(s): PROBNP in the last 8760 hours. HbA1C: No results for input(s): HGBA1C in the last 72 hours. CBG:  Recent Labs Lab 11/29/16 0715 11/29/16 1108 11/29/16 1633 11/29/16 2129 11/30/16 0731  GLUCAP 177* 197* 191* 266* 210*   Lipid Profile: No results for input(s): CHOL, HDL, LDLCALC, TRIG, CHOLHDL, LDLDIRECT in the last 72 hours. Thyroid Function Tests: No results for input(s): TSH, T4TOTAL, FREET4, T3FREE, THYROIDAB in the last 72 hours. Anemia Panel: No results for input(s): VITAMINB12, FOLATE, FERRITIN, TIBC, IRON, RETICCTPCT in the last 72 hours. Sepsis Labs: No results for input(s): PROCALCITON, LATICACIDVEN in the last 168 hours.  Recent Results (from the past 240 hour(s))  MRSA PCR Screening     Status: None   Collection Time: 11/28/16 12:59 AM  Result Value Ref Range Status   MRSA by PCR NEGATIVE NEGATIVE Final    Comment:        The GeneXpert MRSA Assay (FDA approved for NASAL specimens only), is one component of a comprehensive MRSA colonization surveillance program. It is not intended to diagnose MRSA infection nor to guide or monitor  treatment for MRSA infections.          Radiology Studies: No results found.      Scheduled Meds: . amLODipine  10 mg Oral Daily  . enoxaparin (LOVENOX) injection  40 mg Subcutaneous Q24H  . insulin aspart  0-5 Units Subcutaneous QHS  . insulin aspart  0-9 Units Subcutaneous TID WC  . insulin aspart  4 Units Subcutaneous TID WC  . ipratropium-albuterol  3 mL Nebulization Q6H  . lidocaine  1 patch  Transdermal Q24H  . methylPREDNISolone (SOLU-MEDROL) injection  60 mg Intravenous Q6H  . potassium chloride  40 mEq Oral Daily  . [START ON 12/01/2016] predniSONE  40 mg Oral Q breakfast   Continuous Infusions:    LOS: 3 days    Time spent: 30 minutes    Katrinka Blazing, MD [facility] Pager [phone]  If 7PM-7AM, please contact night-coverage [email or web address] Password [email or web address] 11/30/2016, 9:55 AM

## 2016-12-01 LAB — TROPONIN I: Troponin I: <0.03 ng/mL (ref <0.03)

## 2016-12-01 LAB — GLUCOSE, CAPILLARY: GLUCOSE-CAPILLARY: 202 mg/dL — AB (ref 65–99)

## 2016-12-01 MED ORDER — FAMOTIDINE 20 MG PO TABS
20.0000 mg | ORAL_TABLET | Freq: Every day | ORAL | Status: DC
  Administered 2016-12-01: 20 mg via ORAL
  Filled 2016-12-01: qty 1

## 2016-12-01 MED ORDER — SIMETHICONE 80 MG PO CHEW
80.0000 mg | CHEWABLE_TABLET | Freq: Once | ORAL | Status: AC
  Administered 2016-12-01: 80 mg via ORAL
  Filled 2016-12-01: qty 1

## 2016-12-01 MED ORDER — PREDNISONE 20 MG PO TABS: 40.0000 mg | ORAL_TABLET | Freq: Every day | ORAL | 0 refills | Status: DC

## 2016-12-01 MED ORDER — IPRATROPIUM BROMIDE 0.02 % IN SOLN
0.5000 mg | Freq: Four times a day (QID) | RESPIRATORY_TRACT | Status: DC
  Administered 2016-12-01: 0.5 mg via RESPIRATORY_TRACT
  Filled 2016-12-01: qty 2.5

## 2016-12-01 MED ORDER — IPRATROPIUM BROMIDE HFA 17 MCG/ACT IN AERS: 2.0000 | INHALATION_SPRAY | Freq: Four times a day (QID) | RESPIRATORY_TRACT | Status: DC

## 2016-12-01 MED ORDER — ALBUTEROL SULFATE (2.5 MG/3ML) 0.083% IN NEBU
2.5000 mg | INHALATION_SOLUTION | RESPIRATORY_TRACT | Status: DC | PRN
  Administered 2016-12-01: 2.5 mg via RESPIRATORY_TRACT
  Filled 2016-12-01: qty 3

## 2016-12-01 MED ORDER — ALBUTEROL SULFATE HFA 108 (90 BASE) MCG/ACT IN AERS: 1.0000 | INHALATION_SPRAY | RESPIRATORY_TRACT | Status: DC | PRN

## 2016-12-01 NOTE — Progress Notes (Signed)
[facility].  RN Hulan Fray([place]) stated that they do not have access to the new Prednisone order until Monday morning.  RN spoke to the pharmacist on call and pharmacist stated we may send one dose of Prednisone with patient for tomorrow morning.  AC notified that this had been approved by the pharmacist, Mervyn GayLora.

## 2016-12-01 NOTE — Progress Notes (Signed)
RN called report to [facility].  RN Marketing executive([place]) at [facility] [phone].  She stated that the doctor will need to call and speak with their physician, Dr. Littie DeedsGentry, [phone].  Discharge Summary will need to be faxed to [phone].  Dr. Rinaldo RatelKadolph notified via text page.

## 2016-12-01 NOTE — Progress Notes (Signed)
[facility] [place].

## 2016-12-01 NOTE — Discharge Summary (Signed)
Physician Discharge Summary  [name] ZOX:[id number] DOB: 05/14/1956 DOA: 11/27/2016  PCP: No PCP Per Patient  Admit date: 11/27/2016 Discharge date: 12/01/2016  Admitted From: Jail Disposition:  Jail  Recommendations for Outpatient Follow-up:  1. Follow up with PCP in 1-2 weeks 2. Use inhalers as prescribed 3. Please obtain BMP/CBC in one week 4. 1 additional day of steroids ordered   Home Health: No Equipment/Devices: None   Discharge Condition: Stable CODE STATUS: Full Code Diet recommendation: Heart Healthy  Brief/Interim Summary: [name] a 61 y.o.malewith history of COPD, chronic HA s/p VP shunt for hydrocephalus, and HTN presents brought apparently from a jail/ prison with SOB and chest pain. EKG and CXR in ED are negative and trop is negative. Pt was wheezing and SOB , rec'd IV steroids and 1 hr long neb. Still SOB and wheezing. Coughing up yellow sputum. Some anterior CP for which he had negative troponins x 4. No fevers or chills. IV steroids and scheduled breathing treatments were initiated.  Supplemental oxygen was slowly titrated as tolerated and patient was stable off supplemental oxygen even while ambulating on day of discharge.  His regimen of inhalers from his facility were to be resumed at discharge.  He was instructed he could take 1 additional prednisone the day after discharge.  Discharge Diagnoses:  Principal Problem:   Acute chronic obstructive pulmonary disease with respiratory failure (HCC) Active Problems:   HTN (hypertension)   Headache, chronic daily   S/P VP shunt   Hypokalemia   Dyspnea    Discharge Instructions  Discharge Instructions    Call MD for:  difficulty breathing, headache or visual disturbances    Complete by:  As directed    Call MD for:  extreme fatigue    Complete by:  As directed    Call MD for:  hives    Complete by:  As directed    Call MD for:  persistant dizziness or light-headedness    Complete by:   As directed    Call MD for:  persistant nausea and vomiting    Complete by:  As directed    Call MD for:  severe uncontrolled pain    Complete by:  As directed    Call MD for:  temperature >100.4    Complete by:  As directed    Diet - low sodium heart healthy    Complete by:  As directed    Discharge instructions    Complete by:  As directed    Take your at home inhalers as prescribed 1 additional day of prednisone ordered   Increase activity slowly    Complete by:  As directed      Allergies as of 12/01/2016      Reactions   Morphine Itching      Medication List    TAKE these medications   albuterol 108 (90 Base) MCG/ACT inhaler Commonly known as:  PROVENTIL HFA;VENTOLIN HFA Inhale 2 puffs into the lungs 4 (four) times daily as needed for wheezing or shortness of breath.   amLODipine 10 MG tablet Commonly known as:  NORVASC Take 10 mg by mouth daily.   budesonide-formoterol 80-4.5 MCG/ACT inhaler Commonly known as:  SYMBICORT Inhale 2 puffs into the lungs 2 (two) times daily.   hydrochlorothiazide 25 MG tablet Commonly known as:  HYDRODIURIL Take 25 mg by mouth daily.   ipratropium-albuterol 0.5-2.5 (3) MG/3ML Soln Commonly known as:  DUONEB Take 3 mLs by nebulization every 6 (six) hours as needed.  polyethylene glycol packet Commonly known as:  MIRALAX / GLYCOLAX Take 17 g by mouth daily as needed for mild constipation or moderate constipation.   predniSONE 20 MG tablet Commonly known as:  DELTASONE Take 2 tablets (40 mg total) by mouth daily with breakfast. Start taking on:  12/02/2016   tiotropium 18 MCG inhalation capsule Commonly known as:  SPIRIVA Place 18 mcg into inhaler and inhale daily.       Allergies  Allergen Reactions  . Morphine Itching    Consultations:  Respiratory therapy  CM  Procedures/Studies: Dg Chest 2 View  Result Date: 11/27/2016 CLINICAL DATA:  Mid chest pain and productive cough with dyspnea times 2-3 days EXAM:  CHEST  2 VIEW COMPARISON:  07/03/2016 CXR FINDINGS: Heart size is normal. Tortuous thoracic aorta with atherosclerosis, unchanged in appearance. Chronic elevation of right hemidiaphragm with eventration and right lower lobe posterior atelectasis. No overt pulmonary edema, effusion or pneumothorax. Degenerative changes are seen along the dorsal spine. Shunt catheter projects over the right hemithorax. IMPRESSION: Chronic elevation of the right hemidiaphragm with adjacent atelectasis. Aortic atherosclerosis. No active disease. Electronically Signed   By: Tollie Ethavid  Kwon M.D.   On: 11/27/2016 20:37   Dg Chest Port 1 View  Result Date: 11/30/2016 CLINICAL DATA:  Wheezing EXAM: PORTABLE CHEST 1 VIEW COMPARISON:  11/27/2016; 07/03/2016; 04/27/2015; chest CT - 07/08/2016 FINDINGS: Grossly unchanged cardiac silhouette and mediastinal contours given reduced lung volumes. Persistent mild tortuosity of the thoracic aorta. There is persistent mild elevation of the left hemidiaphragm. No focal airspace opacities. No pleural effusion or pneumothorax. No evidence of edema. No acute osseus abnormalities. Presumed ventriculoperitoneal catheter tubing overlies the right midclavicular line, unchanged. IMPRESSION: No acute cardiopulmonary disease on this hypoventilated AP portable examination. Further evaluation with a PA and lateral chest radiograph may be obtained as clinically indicated. Electronically Signed   By: Simonne ComeJohn  Watts M.D.   On: 11/30/2016 13:13      Subjective: Patient reports that he still feels short of breath today and would like to stay an additional day.  He was able to ambulate without oxygen and was stable off supplemental oxygen on day of discharge.  He was eating and drinking normally on day of discharge.  Discharge Exam: Vitals:   12/01/16 1412 12/01/16 1502  BP: (!) 155/89   Pulse:  75  Resp:    Temp:     Vitals:   12/01/16 1411 12/01/16 1412 12/01/16 1437 12/01/16 1502  BP: (!) 172/94 (!)  155/89    Pulse: 70   75  Resp: 18     Temp: 98.2 F (36.8 C)     TempSrc: Oral     SpO2: 98%  93% 97%  Weight:      Height:        General: Pt is alert, awake, not in acute distress Cardiovascular: RRR, S1/S2 +, no rubs, no gallops Respiratory: CTA bilaterally, no wheezing Abdominal: Soft, NT, ND, bowel sounds + Extremities: no edema, no cyanosis    The results of significant diagnostics from this hospitalization (including imaging, microbiology, ancillary and laboratory) are listed below for reference.     Microbiology: Recent Results (from the past 240 hour(s))  MRSA PCR Screening     Status: None   Collection Time: 11/28/16 12:59 AM  Result Value Ref Range Status   MRSA by PCR NEGATIVE NEGATIVE Final    Comment:        The GeneXpert MRSA Assay (FDA approved for NASAL specimens only), is  one component of a comprehensive MRSA colonization surveillance program. It is not intended to diagnose MRSA infection nor to guide or monitor treatment for MRSA infections.      Labs: BNP (last 3 results)  Recent Labs  02/18/16 0828 04/13/16 2029 11/27/16 2001  BNP 15.0 15.0 15.0   Basic Metabolic Panel:  Recent Labs Lab 11/27/16 2001 11/28/16 0420 11/28/16 1137 11/29/16 0802  NA 140 137 136 136  K 2.5* 2.4* 3.7 3.8  CL 97* 97* 100* 101  CO2 36* 26 28 28   GLUCOSE 133* 302* 196* 184*  BUN 17 18 17 19   CREATININE 1.26* 1.42* 1.21 1.14  CALCIUM 9.6 9.2 9.1 8.7*   Liver Function Tests: No results for input(s): AST, ALT, ALKPHOS, BILITOT, PROT, ALBUMIN in the last 168 hours. No results for input(s): LIPASE, AMYLASE in the last 168 hours. No results for input(s): AMMONIA in the last 168 hours. CBC:  Recent Labs Lab 11/27/16 2001 11/28/16 0420  WBC 8.6 9.1  HGB 15.1 13.9  HCT 45.0 42.1  MCV 92.6 92.5  PLT 254 267   Cardiac Enzymes:  Recent Labs Lab 11/28/16 0420 11/30/16 1426 11/30/16 2007 12/01/16 0153  TROPONINI <0.03 <0.03 <0.03 <0.03    BNP: Invalid input(s): POCBNP CBG:  Recent Labs Lab 11/30/16 0731 11/30/16 1146 11/30/16 1628 11/30/16 2113 12/01/16 0743  GLUCAP 210* 237* 222* 239* 202*   D-Dimer No results for input(s): DDIMER in the last 72 hours. Hgb A1c No results for input(s): HGBA1C in the last 72 hours. Lipid Profile No results for input(s): CHOL, HDL, LDLCALC, TRIG, CHOLHDL, LDLDIRECT in the last 72 hours. Thyroid function studies No results for input(s): TSH, T4TOTAL, T3FREE, THYROIDAB in the last 72 hours.  Invalid input(s): FREET3 Anemia work up No results for input(s): VITAMINB12, FOLATE, FERRITIN, TIBC, IRON, RETICCTPCT in the last 72 hours. Urinalysis    Component Value Date/Time   COLORURINE YELLOW 11/28/2016 0108   APPEARANCEUR CLEAR 11/28/2016 0108   LABSPEC 1.013 11/28/2016 0108   PHURINE 5.0 11/28/2016 0108   GLUCOSEU >=500 (A) 11/28/2016 0108   HGBUR NEGATIVE 11/28/2016 0108   BILIRUBINUR NEGATIVE 11/28/2016 0108   KETONESUR 5 (A) 11/28/2016 0108   PROTEINUR NEGATIVE 11/28/2016 0108   UROBILINOGEN 0.2 04/23/2015 1940   NITRITE NEGATIVE 11/28/2016 0108   LEUKOCYTESUR NEGATIVE 11/28/2016 0108   Sepsis Labs Invalid input(s): PROCALCITONIN,  WBC,  LACTICIDVEN Microbiology Recent Results (from the past 240 hour(s))  MRSA PCR Screening     Status: None   Collection Time: 11/28/16 12:59 AM  Result Value Ref Range Status   MRSA by PCR NEGATIVE NEGATIVE Final    Comment:        The GeneXpert MRSA Assay (FDA approved for NASAL specimens only), is one component of a comprehensive MRSA colonization surveillance program. It is not intended to diagnose MRSA infection nor to guide or monitor treatment for MRSA infections.      Time coordinating discharge: 40 minutes  SIGNED:   Katrinka Blazing, MD  [facility] 12/01/2016, 3:25 PM Pager [phone] If 7PM-7AM, please contact night-coverage [email or web address] Password [email or web address]

## 2016-12-01 NOTE — Progress Notes (Addendum)
NT called RN to room.  Stated patient was "raspy".  Patient sitting on edge of bed after taking a bath at bedside.  Patient report short of breath with exertion.  Patient's O2 sats on RA 94-95%.  Patient reports "feeling better" after rest on bed.  Patient sitting on edge of bed.  Dr. Rinaldo RatelKadolph notified.

## 2016-12-01 NOTE — Progress Notes (Signed)
Pt with audible expiratory wheezing, pt calling to nurses station.  Pt requesting breathing treatment.  RRT notified and now at pt bedside to give treatment.

## 2016-12-01 NOTE — Progress Notes (Addendum)
Patient Saturations on Room Air at Rest = 96%  Patient Saturations on Room Air while Ambulating = 93%  Patient ambulated up an down hallway of room on RA.  Patient returned too rom and stats at rest on RA 94%.  Dr. Rinaldo RatelKadolph notified.

## 2016-12-01 NOTE — Progress Notes (Addendum)
Patient reports SOB.  Sats on RA 97%.  Dr. Rinaldo RatelKadolph notified via text page.  Patient to be discharged this afternoon.

## 2016-12-02 ENCOUNTER — Observation Stay (HOSPITAL_COMMUNITY)
Admission: EM | Admit: 2016-12-02 | Discharge: 2016-12-03 | Disposition: A | Discharge status: Home / Self Care | Attending: Internal Medicine | Admitting: Internal Medicine

## 2016-12-02 ENCOUNTER — Emergency Department (HOSPITAL_COMMUNITY)

## 2016-12-02 ENCOUNTER — Encounter (HOSPITAL_COMMUNITY): Payer: Self-pay | Admitting: *Deleted

## 2016-12-02 DIAGNOSIS — I10 Essential (primary) hypertension: Secondary | ICD-10-CM | POA: Diagnosis not present

## 2016-12-02 DIAGNOSIS — R140 Abdominal distension (gaseous): Secondary | ICD-10-CM

## 2016-12-02 DIAGNOSIS — R0602 Shortness of breath: Secondary | ICD-10-CM | POA: Diagnosis present

## 2016-12-02 DIAGNOSIS — R1084 Generalized abdominal pain: Secondary | ICD-10-CM | POA: Diagnosis not present

## 2016-12-02 DIAGNOSIS — E119 Type 2 diabetes mellitus without complications: Secondary | ICD-10-CM | POA: Diagnosis not present

## 2016-12-02 DIAGNOSIS — J441 Chronic obstructive pulmonary disease with (acute) exacerbation: Principal | ICD-10-CM

## 2016-12-02 DIAGNOSIS — Z7984 Long term (current) use of oral hypoglycemic drugs: Secondary | ICD-10-CM | POA: Insufficient documentation

## 2016-12-02 DIAGNOSIS — I5030 Unspecified diastolic (congestive) heart failure: Secondary | ICD-10-CM | POA: Insufficient documentation

## 2016-12-02 DIAGNOSIS — I110 Hypertensive heart disease with heart failure: Secondary | ICD-10-CM | POA: Insufficient documentation

## 2016-12-02 DIAGNOSIS — Z79899 Other long term (current) drug therapy: Secondary | ICD-10-CM | POA: Insufficient documentation

## 2016-12-02 DIAGNOSIS — K5900 Constipation, unspecified: Secondary | ICD-10-CM | POA: Diagnosis not present

## 2016-12-02 DIAGNOSIS — J45909 Unspecified asthma, uncomplicated: Secondary | ICD-10-CM | POA: Diagnosis not present

## 2016-12-02 LAB — URINALYSIS, ROUTINE W REFLEX MICROSCOPIC
BILIRUBIN URINE: NEGATIVE
Glucose, UA: 150 mg/dL — AB
HGB URINE DIPSTICK: NEGATIVE
KETONES UR: NEGATIVE mg/dL
Leukocytes, UA: NEGATIVE
NITRITE: NEGATIVE
Protein, ur: NEGATIVE mg/dL
Specific Gravity, Urine: 1.023 (ref 1.005–1.030)
pH: 7 (ref 5.0–8.0)

## 2016-12-02 LAB — CBC WITH DIFFERENTIAL/PLATELET
BASOS ABS: 0 10*3/uL (ref 0.0–0.1)
BASOS PCT: 0 %
EOS ABS: 0 10*3/uL (ref 0.0–0.7)
EOS PCT: 0 %
HCT: 45.1 % (ref 39.0–52.0)
Hemoglobin: 14.7 g/dL (ref 13.0–17.0)
Lymphocytes Relative: 9 %
Lymphs Abs: 1.6 10*3/uL (ref 0.7–4.0)
MCH: 30.9 pg (ref 26.0–34.0)
MCHC: 32.6 g/dL (ref 30.0–36.0)
MCV: 94.9 fL (ref 78.0–100.0)
Monocytes Absolute: 1.6 10*3/uL — ABNORMAL HIGH (ref 0.1–1.0)
Monocytes Relative: 9 %
Neutro Abs: 14.3 10*3/uL — ABNORMAL HIGH (ref 1.7–7.7)
Neutrophils Relative %: 82 %
Platelets: 241 10*3/uL (ref 150–400)
RBC: 4.75 MIL/uL (ref 4.22–5.81)
RDW: 13.2 % (ref 11.5–15.5)
WBC: 17.6 10*3/uL — AB (ref 4.0–10.5)

## 2016-12-02 LAB — COMPREHENSIVE METABOLIC PANEL
ALBUMIN: 3.4 g/dL — AB (ref 3.5–5.0)
ALT: 13 U/L — AB (ref 17–63)
AST: 15 U/L (ref 15–41)
Alkaline Phosphatase: 56 U/L (ref 38–126)
Anion gap: 6 (ref 5–15)
BUN: 21 mg/dL — ABNORMAL HIGH (ref 6–20)
CALCIUM: 8.6 mg/dL — AB (ref 8.9–10.3)
CO2: 30 mmol/L (ref 22–32)
CREATININE: 0.89 mg/dL (ref 0.61–1.24)
Chloride: 102 mmol/L (ref 101–111)
GFR calc non Af Amer: >60 mL/min (ref >60)
Glucose, Bld: 158 mg/dL — ABNORMAL HIGH (ref 65–99)
Potassium: 4.1 mmol/L (ref 3.5–5.1)
Sodium: 138 mmol/L (ref 135–145)
TOTAL PROTEIN: 6.9 g/dL (ref 6.5–8.1)
Total Bilirubin: 0.7 mg/dL (ref 0.3–1.2)

## 2016-12-02 LAB — LIPASE, BLOOD: LIPASE: 16 U/L (ref 11–51)

## 2016-12-02 LAB — POC OCCULT BLOOD, ED: Fecal Occult Bld: NEGATIVE

## 2016-12-02 LAB — TROPONIN I: Troponin I: <0.03 ng/mL (ref <0.03)

## 2016-12-02 LAB — BRAIN NATRIURETIC PEPTIDE: B Natriuretic Peptide: 92 pg/mL (ref 0.0–100.0)

## 2016-12-02 MED ORDER — SODIUM CHLORIDE 0.9% FLUSH
3.0000 mL | Freq: Two times a day (BID) | INTRAVENOUS | Status: DC
  Administered 2016-12-02 (×2): 3 mL via INTRAVENOUS

## 2016-12-02 MED ORDER — IOPAMIDOL (ISOVUE-300) INJECTION 61%: 30.0000 mL | Freq: Once | INTRAVENOUS | Status: DC | PRN

## 2016-12-02 MED ORDER — ALBUTEROL SULFATE (2.5 MG/3ML) 0.083% IN NEBU: 2.5000 mg | INHALATION_SOLUTION | RESPIRATORY_TRACT | Status: DC | PRN

## 2016-12-02 MED ORDER — IOPAMIDOL (ISOVUE-300) INJECTION 61%: 100.0000 mL | Freq: Once | INTRAVENOUS | Status: DC | PRN

## 2016-12-02 MED ORDER — POLYETHYLENE GLYCOL 3350 17 G PO PACK
17.0000 g | PACK | Freq: Every day | ORAL | Status: DC
  Administered 2016-12-03: 17 g via ORAL
  Filled 2016-12-02: qty 1

## 2016-12-02 MED ORDER — ACETAMINOPHEN 325 MG PO TABS
650.0000 mg | ORAL_TABLET | Freq: Four times a day (QID) | ORAL | Status: DC | PRN
  Administered 2016-12-03: 650 mg via ORAL
  Filled 2016-12-02: qty 2

## 2016-12-02 MED ORDER — MOMETASONE FURO-FORMOTEROL FUM 100-5 MCG/ACT IN AERO
2.0000 | INHALATION_SPRAY | Freq: Two times a day (BID) | RESPIRATORY_TRACT | Status: DC
  Administered 2016-12-02 – 2016-12-03 (×2): 2 via RESPIRATORY_TRACT
  Filled 2016-12-02: qty 8.8

## 2016-12-02 MED ORDER — ONDANSETRON HCL 4 MG PO TABS: 4.0000 mg | ORAL_TABLET | Freq: Four times a day (QID) | ORAL | Status: DC | PRN

## 2016-12-02 MED ORDER — LEVOFLOXACIN 750 MG PO TABS
750.0000 mg | ORAL_TABLET | Freq: Every day | ORAL | Status: DC
  Administered 2016-12-02 – 2016-12-03 (×2): 750 mg via ORAL
  Filled 2016-12-02 (×2): qty 1

## 2016-12-02 MED ORDER — ALBUTEROL (5 MG/ML) CONTINUOUS INHALATION SOLN
15.0000 mg/h | INHALATION_SOLUTION | Freq: Once | RESPIRATORY_TRACT | Status: AC
  Administered 2016-12-02: 15 mg/h via RESPIRATORY_TRACT
  Filled 2016-12-02: qty 20

## 2016-12-02 MED ORDER — MAGNESIUM SULFATE 2 GM/50ML IV SOLN
2.0000 g | Freq: Once | INTRAVENOUS | Status: AC
  Administered 2016-12-02: 2 g via INTRAVENOUS
  Filled 2016-12-02: qty 50

## 2016-12-02 MED ORDER — HYDROCHLOROTHIAZIDE 25 MG PO TABS
25.0000 mg | ORAL_TABLET | Freq: Every day | ORAL | Status: DC
  Administered 2016-12-02 – 2016-12-03 (×2): 25 mg via ORAL
  Filled 2016-12-02 (×2): qty 1

## 2016-12-02 MED ORDER — ALUM & MAG HYDROXIDE-SIMETH 200-200-20 MG/5ML PO SUSP
30.0000 mL | Freq: Four times a day (QID) | ORAL | Status: DC | PRN
  Administered 2016-12-02: 30 mL via ORAL
  Filled 2016-12-02: qty 30

## 2016-12-02 MED ORDER — MILK AND MOLASSES ENEMA
1.0000 | Freq: Once | RECTAL | Status: AC
  Administered 2016-12-02: 250 mL via RECTAL

## 2016-12-02 MED ORDER — KETOROLAC TROMETHAMINE 30 MG/ML IJ SOLN
30.0000 mg | Freq: Four times a day (QID) | INTRAMUSCULAR | Status: DC | PRN
  Administered 2016-12-02 – 2016-12-03 (×3): 30 mg via INTRAVENOUS
  Filled 2016-12-02 (×3): qty 1

## 2016-12-02 MED ORDER — ONDANSETRON HCL 4 MG/2ML IJ SOLN: 4.0000 mg | Freq: Four times a day (QID) | INTRAMUSCULAR | Status: DC | PRN

## 2016-12-02 MED ORDER — ENOXAPARIN SODIUM 40 MG/0.4ML ~~LOC~~ SOLN
40.0000 mg | SUBCUTANEOUS | Status: DC
  Administered 2016-12-02: 40 mg via SUBCUTANEOUS
  Filled 2016-12-02: qty 0.4

## 2016-12-02 MED ORDER — GUAIFENESIN ER 600 MG PO TB12
1200.0000 mg | ORAL_TABLET | Freq: Two times a day (BID) | ORAL | Status: DC
  Administered 2016-12-02 – 2016-12-03 (×3): 1200 mg via ORAL
  Filled 2016-12-02 (×2): qty 2

## 2016-12-02 MED ORDER — IPRATROPIUM-ALBUTEROL 0.5-2.5 (3) MG/3ML IN SOLN
3.0000 mL | Freq: Once | RESPIRATORY_TRACT | Status: AC
  Administered 2016-12-02: 3 mL via RESPIRATORY_TRACT
  Filled 2016-12-02: qty 3

## 2016-12-02 MED ORDER — IOPAMIDOL (ISOVUE-300) INJECTION 61%
INTRAVENOUS | Status: AC
  Filled 2016-12-02: qty 30

## 2016-12-02 MED ORDER — IPRATROPIUM BROMIDE 0.02 % IN SOLN
0.5000 mg | Freq: Once | RESPIRATORY_TRACT | Status: AC
  Administered 2016-12-02: 0.5 mg via RESPIRATORY_TRACT
  Filled 2016-12-02: qty 2.5

## 2016-12-02 MED ORDER — METHYLPREDNISOLONE SODIUM SUCC 125 MG IJ SOLR
60.0000 mg | Freq: Four times a day (QID) | INTRAMUSCULAR | Status: DC
  Administered 2016-12-02 – 2016-12-03 (×4): 60 mg via INTRAVENOUS
  Filled 2016-12-02 (×3): qty 2

## 2016-12-02 MED ORDER — SODIUM CHLORIDE 0.9% FLUSH: 3.0000 mL | INTRAVENOUS | Status: DC | PRN

## 2016-12-02 MED ORDER — AMLODIPINE BESYLATE 5 MG PO TABS
10.0000 mg | ORAL_TABLET | Freq: Every day | ORAL | Status: DC
  Administered 2016-12-02 – 2016-12-03 (×2): 10 mg via ORAL
  Filled 2016-12-02 (×2): qty 2

## 2016-12-02 MED ORDER — SODIUM CHLORIDE 0.9 % IV SOLN: 250.0000 mL | INTRAVENOUS | Status: DC | PRN

## 2016-12-02 MED ORDER — METHYLPREDNISOLONE SODIUM SUCC 125 MG IJ SOLR
125.0000 mg | Freq: Once | INTRAMUSCULAR | Status: AC
  Administered 2016-12-02: 125 mg via INTRAVENOUS
  Filled 2016-12-02: qty 2

## 2016-12-02 MED ORDER — IPRATROPIUM-ALBUTEROL 0.5-2.5 (3) MG/3ML IN SOLN
3.0000 mL | Freq: Four times a day (QID) | RESPIRATORY_TRACT | Status: DC
  Administered 2016-12-02 – 2016-12-03 (×5): 3 mL via RESPIRATORY_TRACT
  Filled 2016-12-02 (×5): qty 3

## 2016-12-02 MED ORDER — ACETAMINOPHEN 650 MG RE SUPP: 650.0000 mg | Freq: Four times a day (QID) | RECTAL | Status: DC | PRN

## 2016-12-02 NOTE — ED Notes (Signed)
Ambulated pt once around nurses' station. O2 sats 95% at beginning of ambulation and HR was 106. Pt's sats dropped to 90% during ambulation and HR was 119. Pt with audible and increased wheezes during ambulation.

## 2016-12-02 NOTE — ED Notes (Signed)
Pt stable and ready for transport to [facility]. Report given to Dia CrawfordLisa Russell, RN.

## 2016-12-02 NOTE — H&P (Signed)
History and Physical    [name] RUE:[id number] DOB: [birth year] DOA: 12/02/2016  PCP: No PCP Per Patient  [facility]  I [facility] personally briefly [facility]  Chief Complaint: Shortness of breath  HPI: [name] is a 61 y.o. male with medical history significant of history of COPD who was discharged yesterday after being treated for COPD exacerbation. Patient reports that despite receiving 5 days of IV steroids, he felt that his breathing had not improved at the time of discharge. Review of records indicate the patient was ambulating on room air and was maintaining oxygen saturations greater than 90%. He was discharged with a course of prednisone. He came back to the emergency room the same night with wheezing and shortness of breath. He also reports that he has abdominal distention has not had a bowel movement in 4 days. He did notice some blood per rectum. He complains of substernal chest pain that is worse with coughing. Of note, on his last admission and ruled out for ACS with negative cardiac markers. Due to his significant wheezing, he was referred for admission.   Review of Systems: As per HPI otherwise 10 point review of systems negative.    Past Medical History:  Diagnosis Date  . Abdominal hernia   . Asthma   . BPH (benign prostatic hyperplasia)   . Chronic pain   . COPD (chronic obstructive pulmonary disease) (HCC)   . Diabetes mellitus   . Diastolic CHF (HCC)   . Headache(784.0)    chronic, daily  . Hyperlipidemia   . Hypertension   . Substance abuse    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 Cocaine    Past Surgical History:  Procedure Laterality Date  . BRAIN SURGERY    . COLON SURGERY  06/2011   Diverticulitis Complicated  . MANDIBLE SURGERY    . VENTRICULOPERITONEAL SHUNT       reports that he has never smoked. He has never used smokeless tobacco. He reports that he does not drink alcohol  or use drugs.  Allergies  Allergen Reactions  . Morphine Itching    Family History  Problem Relation Age of Onset  . Hypertension Mother   . Hyperlipidemia Mother   . Depression Mother   . Hypertension Father   . Hyperlipidemia Father   . Diabetes Father   . Arthritis    . Asthma       Prior to Admission medications   Medication Sig Start Date End Date Taking? Authorizing Provider  albuterol (PROVENTIL HFA;VENTOLIN HFA) 108 (90 Base) MCG/ACT inhaler Inhale 2 puffs into the lungs 4 (four) times daily as needed for wheezing or shortness of breath.    Yes Historical Provider, MD  amLODipine (NORVASC) 10 MG tablet Take 10 mg by mouth daily.   Yes Historical Provider, MD  hydrochlorothiazide (HYDRODIURIL) 25 MG tablet Take 25 mg by mouth daily.   Yes Historical Provider, MD  predniSONE (DELTASONE) 20 MG tablet Take 2 tablets (40 mg total) by mouth daily with breakfast. 12/02/16 12/03/16 Yes Filbert SchilderAlexandria U Kadolph, MD  budesonide-formoterol [employer](SYMBICORT) 80-4.5 MCG/ACT inhaler Inhale 2 puffs into the lungs 2 (two) times daily.    Historical Provider, MD  ipratropium-albuterol (DUONEB) 0.5-2.5 (3) MG/3ML SOLN Take 3 mLs by nebulization every 6 (six) hours as needed. 07/12/16   Maryann Mikhail, DO  polyethylene glycol (MIRALAX / GLYCOLAX) packet Take 17 g by mouth daily as needed for mild constipation or moderate constipation. Patient not  taking: Reported on 11/27/2016 07/12/16   Maryann Mikhail, DO  tiotropium (SPIRIVA) 18 MCG inhalation capsule Place 18 mcg into inhaler and inhale daily.    Historical Provider, MD    Physical Exam: Vitals:   12/02/16 0645 12/02/16 0700 12/02/16 0730 12/02/16 0827  BP:  157/93 143/87 (!) 160/87  Pulse:  79 75 92  Resp: 17 17 15    Temp:    98 F (36.7 C)  TempSrc:    Oral  SpO2:  92% (!) 89%   Weight:    110.8 kg (244 lb 4.3 oz)  Height:    5\' 8"  (1.727 m)    Constitutional: NAD, calm, comfortable Vitals:   12/02/16 0645 12/02/16 0700 12/02/16 0730  12/02/16 0827  BP:  157/93 143/87 (!) 160/87  Pulse:  79 75 92  Resp: 17 17 15    Temp:    98 F (36.7 C)  TempSrc:    Oral  SpO2:  92% (!) 89%   Weight:    110.8 kg (244 lb 4.3 oz)  Height:    5\' 8"  (1.727 m)   Eyes: PERRL, lids and conjunctivae normal ENMT: Mucous membranes are moist. Posterior pharynx clear of any exudate or lesions.Normal dentition.  Neck: normal, supple, no masses, no thyromegaly Respiratory: Upper airway wheezing with forced expiration. These generally resolve with slower exhaling.  Cardiovascular: Regular rate and rhythm, no murmurs / rubs / gallops. No extremity edema. 2+ pedal pulses. No carotid bruits.  Abdomen: Mild, diffuse tenderness, appears distended, no masses palpated. No hepatosplenomegaly. Bowel sounds positive.  Musculoskeletal: no clubbing / cyanosis. No joint deformity upper and lower extremities. Good ROM, no contractures. Normal muscle tone.  Skin: no rashes, lesions, ulcers. No induration Neurologic: CN 2-12 grossly intact. Sensation intact, DTR normal. Strength 5/5 in all 4.  Psychiatric: Normal judgment and insight. Alert and oriented x 3. Normal mood.     Labs on Admission: I have personally reviewed following labs and imaging studies  CBC:  Recent Labs Lab 11/27/16 2001 11/28/16 0420 12/02/16 0058  WBC 8.6 9.1 17.6*  NEUTROABS  --   --  14.3*  HGB 15.1 13.9 14.7  HCT 45.0 42.1 45.1  MCV 92.6 92.5 94.9  PLT 254 267 241   Basic Metabolic Panel:  Recent Labs Lab 11/27/16 2001 11/28/16 0420 11/28/16 1137 11/29/16 0802 12/02/16 0058  NA 140 137 136 136 138  K 2.5* 2.4* 3.7 3.8 4.1  CL 97* 97* 100* 101 102  CO2 36* 26 28 28 30   GLUCOSE 133* 302* 196* 184* 158*  BUN 17 18 17 19  21*  CREATININE 1.26* 1.42* 1.21 1.14 0.89  CALCIUM 9.6 9.2 9.1 8.7* 8.6*   GFR: Estimated Creatinine Clearance: 106.6 mL/min (by C-G formula based on SCr of 0.89 mg/dL). Liver Function Tests:  Recent Labs Lab 12/02/16 0058  AST 15  ALT 13*   ALKPHOS 56  BILITOT 0.7  PROT 6.9  ALBUMIN 3.4*    Recent Labs Lab 12/02/16 0058  LIPASE 16   No results for input(s): AMMONIA in the last 168 hours. Coagulation Profile: No results for input(s): INR, PROTIME in the last 168 hours. Cardiac Enzymes:  Recent Labs Lab 11/28/16 0420 11/30/16 1426 11/30/16 2007 12/01/16 0153 12/02/16 0058  TROPONINI <0.03 <0.03 <0.03 <0.03 <0.03   BNP (last 3 results) No results for input(s): PROBNP in the last 8760 hours. HbA1C: No results for input(s): HGBA1C in the last 72 hours. CBG:  Recent Labs Lab 11/30/16 0731 11/30/16 1146 11/30/16  1628 11/30/16 2113 12/01/16 0743  GLUCAP 210* 237* 222* 239* 202*   Lipid Profile: No results for input(s): CHOL, HDL, LDLCALC, TRIG, CHOLHDL, LDLDIRECT in the last 72 hours. Thyroid Function Tests: No results for input(s): TSH, T4TOTAL, FREET4, T3FREE, THYROIDAB in the last 72 hours. Anemia Panel: No results for input(s): VITAMINB12, FOLATE, FERRITIN, TIBC, IRON, RETICCTPCT in the last 72 hours. Urine analysis:    Component Value Date/Time   COLORURINE YELLOW 12/02/2016 0102   APPEARANCEUR CLEAR 12/02/2016 0102   LABSPEC 1.023 12/02/2016 0102   PHURINE 7.0 12/02/2016 0102   GLUCOSEU 150 (A) 12/02/2016 0102   HGBUR NEGATIVE 12/02/2016 0102   BILIRUBINUR NEGATIVE 12/02/2016 0102   KETONESUR NEGATIVE 12/02/2016 0102   PROTEINUR NEGATIVE 12/02/2016 0102   UROBILINOGEN 0.2 04/23/2015 1940   NITRITE NEGATIVE 12/02/2016 0102   LEUKOCYTESUR NEGATIVE 12/02/2016 0102    Radiological Exams on Admission: Dg Chest 2 View  Result Date: 12/02/2016 CLINICAL DATA:  Dyspnea EXAM: CHEST  2 VIEW COMPARISON:  11/30/2016 chest radiograph. FINDINGS: Stable moderate right hemidiaphragmatic eventration. Stable cardiomediastinal silhouette with normal heart size and aortic atherosclerosis. No pneumothorax. No pleural effusion. No pulmonary edema. Stable mild right basilar atelectasis. No acute consolidative  airspace disease. Visualized portions of the right VP shunt appear intact. IMPRESSION: 1. No acute cardiopulmonary disease. 2. Stable right hemidiaphragmatic eventration with mild right basilar atelectasis. 3. Aortic atherosclerosis. Electronically Signed   By: Delbert Phenix M.D.   On: 12/02/2016 07:33   Ct Abdomen Pelvis W Contrast  Result Date: 12/02/2016 CLINICAL DATA:  Chest pain and dyspnea. Rectal bleeding. Abdominal distention. Recent hospital discharge. EXAM: CT ABDOMEN AND PELVIS WITH CONTRAST TECHNIQUE: Multidetector CT imaging of the abdomen and pelvis was performed using the standard protocol following bolus administration of intravenous contrast. CONTRAST:  ISOVUE-300 IOPAMIDOL (ISOVUE-300) INJECTION 61% COMPARISON:  07/29/2011 FINDINGS: Lower chest: There is chronic elevation of the right hemidiaphragm with adjacent chronic volume loss in the right lung base due to scarring or atelectasis. This is unchanged. No acute findings. Hepatobiliary: No focal liver lesions. Gallbladder and bile ducts appear unremarkable. Pancreas: Unremarkable. No pancreatic ductal dilatation or surrounding inflammatory changes. Spleen: Normal in size without focal abnormality. Adrenals/Urinary Tract: Water density 3.5 cm lower pole left renal lesion, indeterminate but probably a benign cyst. 3 mm calculus of the upper pole left renal collecting system. No suspicious focal renal lesions. No hydronephrosis or ureteral dilatation. No ureteral calculi. Urinary bladder is unremarkable. Stomach/Bowel: Stomach is within normal limits. Appendix is normal. No evidence of bowel wall thickening, distention, or inflammatory changes. Vascular/Lymphatic: No significant vascular findings are present. No enlarged abdominal or pelvic lymph nodes. Reproductive: Unremarkable Other: Ventriculoperitoneal shunt tubing noted. No focal inflammation. Small volume peritoneal fluid may be related to the ventriculoperitoneal shunt tube.  Musculoskeletal: No significant skeletal lesion. IMPRESSION: 1. Unchanged chronic right hemidiaphragm elevation with adjacent scarring or atelectasis the right lung base. 2. Left nephrolithiasis, nonobstructing. 3. Several water density lesions of the kidneys, not conclusively characterize but more likely benign cysts. 4. Small volume peritoneal fluid, probably related to the ventriculoperitoneal shunt tubing. Electronically Signed   By: Ellery Plunk M.D.   On: 12/02/2016 04:37    EKG: Independently reviewed. No acute ST or T changes  Assessment/Plan Active Problems:   HTN (hypertension)   Abdominal distention   COPD exacerbation (HCC)   Constipation    1. COPD exacerbation. Patient has very faint wheezes bilaterally which are more pronounced with forced expiration. Will start the patient back  on IV steroids, continue nebulizer treatments. Add flutter valve and continue pulmonary hygiene with Mucinex. Will start on a course of antibiotics. Oxygen saturations were as low as 89% in the emergency room, although this is not qualify him for supplemental oxygen. We will continue to monitor overnight.  2. Abdominal distention with constipation. CT of the abdomen and pelvis was unremarkable. He reports that he has not had a bowel movement in several days. We'll give the patient one enema. Will start on daily MiraLAX. He did describe some blood in stools. Rectal exam done in the ER was negative. Hemoglobin is stable. Continue to monitor for now.  3. Hypertension. Continue outpatient regimen.  DVT prophylaxis: Lovenox Code Status: Full code Family Communication: No family present Disposition Plan: [facility] back to prison when respiratory status stable, likely in a.m. Consults called:  Admission status: Observation, Link Snuffer MD [facility] Pager [phone]  If 7PM-7AM, please contact night-coverage [email or web address] Password [email or web address]  12/02/2016, 2:40 PM

## 2016-12-02 NOTE — ED Notes (Signed)
Pt to CT

## 2016-12-02 NOTE — ED Provider Notes (Signed)
[facility]-EM[facility]GENCY DEPT Provider Note   CSN: [id number] Arrival date & time: 12/02/16  [id number]   By signing my name below, I, Talbert NanPaul Grant, attest that this documentation has been prepared under the direction and in the presence of Glynn OctaveStephen Aralyn Nowak, MD. Electronically Signed: Talbert NanPaul Grant, Scribe. 12/02/16. 1:02 AM.    History   Chief Complaint Chief Complaint  Patient presents with  . Chest Pain  . Shortness of Breath  . Rectal Bleeding    HPI [name] is a 61 y.o. male with h/o HTN, COPD, HLD, Dm, CHF brought in by ambulance, who presents to the Emergency Department complaining of moderate, central chest pain that has been constant for 4 days. Pt left the hospital at 5 pm yesterday. Pt has associated rectal bleeding, SOB, constant abdominal pain. Pt has not had BM for 3-4 days. Pt states that he is more distended than normal. Pt has h/o diverticulitis surgery. Pt is on inhalers and steroids. Pt does not use home O2. Pt has been coughing up yellow phlegm. Pt never had heart attack. Pt has not stents placed in heart. Pt has had a stress test. Pt's last breathing tx was earlier this evening. 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 Pt does not take any water pills. Pt denies back pain, emesis. Pt is not a diabetic. Pt is allergic to morphine.   The history is provided by the patient. No language interpreter was used.    Past Medical History:  Diagnosis Date  . Abdominal hernia   . Asthma   . BPH (benign prostatic hyperplasia)   . Chronic pain   . COPD (chronic obstructive pulmonary disease) (HCC)   . Diabetes mellitus   . Diastolic CHF (HCC)   . Headache(784.0)    chronic, daily  . Hyperlipidemia   . Hypertension   . Substance abuse    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 Cocaine    Patient Active Problem List   Diagnosis Date Noted  . Acute chronic obstructive pulmonary disease with respiratory failure (HCC) 11/27/2016  . Chest pain   . Dyspnea   . Abdominal distention     . Community acquired pneumonia of right lower lobe of lung (HCC) 07/02/2016  . Leukocytosis 05/21/2016  . COPD with acute exacerbation (HCC) 05/20/2016  . Acute respiratory failure (HCC) 05/20/2016  . Hypokalemia 05/20/2016  . Obesity 04/28/2013  . COPD with exacerbation (HCC) 08/05/2012  . Rotator cuff tear, left 05/22/2012  . Rotator cuff syndrome of left shoulder 05/22/2012  . Chronic pain 02/04/2012  . Environmental allergies 02/04/2012  . COPD (chronic obstructive pulmonary disease) (HCC) 12/05/2011  . Hyperlipidemia 11/20/2011  . Headache, chronic daily 11/20/2011  . 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 Seizure prophylaxis 11/20/2011  . Substance abuse 11/20/2011  . Diabetes mellitus type II, controlled (HCC) 10/26/2009  . HTN (hypertension) 10/26/2009  . CHF 10/26/2009    Past Surgical History:  Procedure Laterality Date  . BRAIN SURGERY    . COLON SURGERY  06/2011   Diverticulitis Complicated  . MANDIBLE SURGERY    . VENTRICULOPERITONEAL SHUNT         Home Medications    Prior to Admission medications   Medication Sig Start Date End Date Taking? Authorizing Provider  albuterol (PROVENTIL HFA;VENTOLIN HFA) 108 (90 Base) MCG/ACT inhaler Inhale 2 puffs into the lungs 4 (four) times daily as needed for wheezing or shortness of breath.     Historical Provider, MD  amLODipine (NORVASC) 10 MG tablet Take  10 mg by mouth daily.    Historical Provider, MD  budesonide-formoterol (SYMBICORT) 80-4.5 MCG/ACT inhaler Inhale 2 puffs into the lungs 2 (two) times daily.    Historical Provider, MD  hydrochlorothiazide (HYDRODIURIL) 25 MG tablet Take 25 mg by mouth daily.    Historical Provider, MD  ipratropium-albuterol (DUONEB) 0.5-2.5 (3) MG/3ML SOLN Take 3 mLs by nebulization every 6 (six) hours as needed. 07/12/16   Maryann Mikhail, DO  polyethylene glycol (MIRALAX / GLYCOLAX) packet Take 17 g by mouth daily as needed for mild constipation or moderate constipation. Patient not taking:  Reported on 11/27/2016 07/12/16   Nita Sells Mikhail, DO  predniSONE (DELTASONE) 20 MG tablet Take 2 tablets (40 mg total) by mouth daily with breakfast. 12/02/16 12/03/16  Filbert Schilder, MD  tiotropium (SPIRIVA) 18 MCG inhalation capsule Place 18 mcg into inhaler and inhale daily.    Historical Provider, MD    Family History Family History  Problem Relation Age of Onset  . Hypertension Mother   . Hyperlipidemia Mother   . Depression Mother   . Hypertension Father   . Hyperlipidemia Father   . Diabetes Father   . Arthritis    . Asthma      Social History Social History  Substance Use Topics  . Smoking status: Never Smoker  . Smokeless tobacco: Never Used  . Alcohol use No     Allergies   Morphine   Review of Systems Review of Systems  A complete 10 system review of systems was obtained and all systems are negative except as noted in the HPI and PMH.    Physical Exam Updated Vital Signs BP 179/95   Pulse 79   Temp 98.2 F (36.8 C) (Oral)   Resp 20   Wt 244 lb (110.7 kg)   SpO2 95%   BMI 37.10 kg/m   Physical Exam  Constitutional: He is oriented to person, place, and time. He appears well-developed and well-nourished. No distress.  HENT:  Head: Normocephalic and atraumatic.  Mouth/Throat: Oropharynx is clear and moist. No oropharyngeal exudate.  Eyes: Conjunctivae and EOM are normal. Pupils are equal, round, and reactive to light.  Neck: Normal range of motion. Neck supple.  No meningismus.  Cardiovascular: Normal rate, regular rhythm, normal heart sounds and intact distal pulses.   No murmur heard. Pulmonary/Chest: Breath sounds normal.  Speaking in short sentences. Decreased air movement with expiratory wheezing.  Abdominal: He exhibits distension. There is tenderness. There is no rebound and no guarding.  Distended abdomen. Diffusely tender.  Genitourinary:  Genitourinary Comments: Rectal exam has no fissures, no hemorrhoids, no gross blood.    Musculoskeletal: Normal range of motion. He exhibits no edema or tenderness.  Neurological: He is alert and oriented to person, place, and time. No cranial nerve deficit. He exhibits normal muscle tone. Coordination normal.   5/5 strength throughout. CN 2-12 intact.Equal grip strength.   Skin: Skin is warm.  Psychiatric: He has a normal mood and affect. His behavior is normal.  Nursing note and vitals reviewed.    ED Treatments / Results   DIAGNOSTIC STUDIES: Oxygen Saturation is 95% on room air, adequate by my interpretation.    COORDINATION OF CARE: 12:57 AM Discussed treatment plan with pt at bedside and pt agreed to plan, which includes rectal exam, blood work, breathing tx, steroids..    Labs (all labs ordered are listed, but only abnormal results are displayed) Labs Reviewed  CBC WITH DIFFERENTIAL/PLATELET - Abnormal; Notable for the following:  Result Value   WBC 17.6 (*)    Neutro Abs 14.3 (*)    Monocytes Absolute 1.6 (*)    All other components within normal limits  COMPREHENSIVE METABOLIC PANEL - Abnormal; Notable for the following:    Glucose, Bld 158 (*)    BUN 21 (*)    Calcium 8.6 (*)    Albumin 3.4 (*)    ALT 13 (*)    All other components within normal limits  URINALYSIS, ROUTINE W REFLEX MICROSCOPIC - Abnormal; Notable for the following:    Glucose, UA 150 (*)    All other components within normal limits  LIPASE, BLOOD  TROPONIN I  BRAIN NATRIURETIC PEPTIDE  POC OCCULT BLOOD, ED    EKG  EKG Interpretation  Date/Time:  Sunday December 02 2016 00:44:28 EST Ventricular Rate:  68 PR Interval:    QRS Duration: 94 QT Interval:  396 QTC Calculation: 422 R Axis:   -32 Text Interpretation:  Sinus rhythm Abnormal R-wave progression, early transition Left ventricular hypertrophy Inferior infarct, old Anterior Q waves, possibly due to LVH No significant change was found Confirmed by Manus Gunning  MD, Mirha Brucato [phone]) on 12/02/2016 1:09:15 AM        Radiology Dg Chest Port 1 View  Result Date: 11/30/2016 CLINICAL DATA:  Wheezing EXAM: PORTABLE CHEST 1 VIEW COMPARISON:  11/27/2016; 07/03/2016; 04/27/2015; chest CT - 07/08/2016 FINDINGS: Grossly unchanged cardiac silhouette and mediastinal contours given reduced lung volumes. Persistent mild tortuosity of the thoracic aorta. There is persistent mild elevation of the left hemidiaphragm. No focal airspace opacities. No pleural effusion or pneumothorax. No evidence of edema. No acute osseus abnormalities. Presumed ventriculoperitoneal catheter tubing overlies the right midclavicular line, unchanged. IMPRESSION: No acute cardiopulmonary disease on this hypoventilated AP portable examination. Further evaluation with a PA and lateral chest radiograph may be obtained as clinically indicated. Electronically Signed   By: Simonne Come M.D.   On: 11/30/2016 13:13    Procedures Procedures (including critical care time)  Medications Ordered in ED Medications - No data to display   Initial Impression / Assessment and Plan / ED Course  I have reviewed the triage vital signs and the nursing notes.  Pertinent labs & imaging results that were available during my care of the patient were reviewed by me and considered in my medical decision making (see chart for details).     Patient discharged from hospital at 5 PM on March 10. States his breathing never got better. He's had Constant shortness of breath and chest pain is been constant for the past 5 days. Chest pain is worse with palpation. Also states he's had diffusely worsening abdominal distention and pain and saw some blood with the bowel movement today.  Patient with tight wheezing on exam. Will be given nebulizers and steroids. Distended abdomen is diffusely tender. No gross blood per rectum.  Chest pain atypical for ACS.  EKG unchanged. CXR negative. Hemoglobin stable, FOBT negative, likely blood from straining on toilet.  CT abdomen without  acute finding.  Continues to wheeze after steroids, magnesium and multiple neb treatments. O2 89% with ambulation.  Would benefit from ongoing nebulizers and IV steroids as respiratory status still seems suboptimal.  D/w Dr. Sharl Ma.   Final Clinical Impressions(s) / ED Diagnoses   Final diagnoses:  COPD exacerbation (HCC)    New Prescriptions New Prescriptions   No medications on file  I personally performed the services described in this documentation, which was scribed in my presence. The recorded information  has been reviewed and is accurate.     Glynn Octave, MD 12/02/16 [phone]

## 2016-12-02 NOTE — ED Triage Notes (Signed)
Pt c/o chest pain, sob, distended abd, rectal bleeding that started tonight, pt states that he was discharged from hospital earlier today and symptoms returned this evening,

## 2016-12-03 DIAGNOSIS — R140 Abdominal distension (gaseous): Secondary | ICD-10-CM | POA: Diagnosis not present

## 2016-12-03 DIAGNOSIS — K5900 Constipation, unspecified: Secondary | ICD-10-CM | POA: Diagnosis not present

## 2016-12-03 DIAGNOSIS — I10 Essential (primary) hypertension: Secondary | ICD-10-CM | POA: Diagnosis not present

## 2016-12-03 DIAGNOSIS — J441 Chronic obstructive pulmonary disease with (acute) exacerbation: Secondary | ICD-10-CM | POA: Diagnosis not present

## 2016-12-03 LAB — GLUCOSE, CAPILLARY
Glucose-Capillary: 195 mg/dL — ABNORMAL HIGH (ref 65–99)
Glucose-Capillary: 172 mg/dL — ABNORMAL HIGH (ref 65–99)

## 2016-12-03 MED ORDER — GUAIFENESIN ER 600 MG PO TB12
600.0000 mg | ORAL_TABLET | Freq: Two times a day (BID) | ORAL | 0 refills | Status: DC
Start: 2016-12-03 — End: 2017-09-27

## 2016-12-03 MED ORDER — LEVOFLOXACIN 750 MG PO TABS: 750.0000 mg | ORAL_TABLET | Freq: Every day | ORAL | 0 refills | Status: DC

## 2016-12-03 MED ORDER — ALBUTEROL SULFATE (2.5 MG/3ML) 0.083% IN NEBU: 2.5000 mg | INHALATION_SOLUTION | Freq: Three times a day (TID) | RESPIRATORY_TRACT | 12 refills | Status: AC

## 2016-12-03 MED ORDER — GUAIFENESIN ER 600 MG PO TB12: 600.0000 mg | ORAL_TABLET | Freq: Two times a day (BID) | ORAL | 0 refills | Status: DC

## 2016-12-03 MED ORDER — PREDNISONE 10 MG PO TABS: ORAL_TABLET | ORAL | 0 refills | Status: DC

## 2016-12-03 MED ORDER — ALBUTEROL SULFATE (2.5 MG/3ML) 0.083% IN NEBU: 2.5000 mg | INHALATION_SOLUTION | Freq: Three times a day (TID) | RESPIRATORY_TRACT | 12 refills | Status: DC

## 2016-12-03 MED ORDER — POLYETHYLENE GLYCOL 3350 17 G PO PACK: 17.0000 g | PACK | Freq: Every day | ORAL | 0 refills | Status: DC

## 2016-12-03 NOTE — Discharge Summary (Signed)
Physician Discharge Summary  [name] ZOX:[id number] DOB: [birth year] DOA: 12/02/2016  PCP: No PCP Per Patient  Admit date: 12/02/2016 Discharge date: 12/03/2016  [facility]: [facility] Disposition:  [facility]  Recommendations for Outpatient Follow-up:  1. Follow up with PCP in 1-2 weeks 2. Please obtain BMP/CBC in one week  Discharge Condition: stable CODE STATUS: full code Diet recommendation: Heart Healthy   Brief/Interim Summary: This patient was admitted to the hospital yesterday for shortness of breath. He had been discharged on 3/10 after being treated for 5 days for copd exacerbation, and came back to ED the same evening. Patient was reportedly wheezing and short of breath. He did not meet requirements for supplemental oxygen. Patient was started on steroids, nebs and antibiotics. Patient's wheeze is most pronounced when he appears to be forcefully exhaling. When he is asked to slow his respiratory rate down, and take long, deep breaths, his wheezing appears to resolve. He may have a component of anxiety related to his shortness of breath vs. Malingering in order to stay in the hospital. In any case, his wheezing dramatically improved today after he was told that he will be discharging. He will be continued on neb treatments, prednisone taper and antibiotic course. He was ambulated in the hall on room air and maintained oxygen saturations >95%.  Patient's constipation resolved after receiving milk and molasses enema. He will be continued on miralax daily. He did not have any further rectal bleeding.  Discharge Diagnoses:  Active Problems:   HTN (hypertension)   Abdominal distention   COPD exacerbation (HCC)   Constipation    Discharge Instructions  Discharge Instructions    Diet - low sodium heart healthy    Complete by:  As directed    Increase activity slowly    Complete by:  As directed      Allergies as of 12/03/2016      Reactions   Morphine Itching      Medication List    TAKE these medications   albuterol 108 (90 Base) MCG/ACT inhaler Commonly known as:  PROVENTIL HFA;VENTOLIN HFA Inhale 2 puffs into the lungs 4 (four) times daily as needed for wheezing or shortness of breath. What changed:  Another medication with the same name was changed. Make sure you understand how and when to take each.   albuterol (2.5 MG/3ML) 0.083% nebulizer solution Commonly known as:  PROVENTIL Take 3 mLs (2.5 mg total) by nebulization 3 (three) times daily. What changed:  when to take this  reasons to take this   amLODipine 10 MG tablet Commonly known as:  NORVASC Take 10 mg by mouth daily.   budesonide-formoterol 80-4.5 MCG/ACT inhaler Commonly known as:  SYMBICORT Inhale 2 puffs into the lungs 2 (two) times daily.   guaiFENesin 600 MG 12 hr tablet Commonly known as:  MUCINEX Take 1 tablet (600 mg total) by mouth 2 (two) times daily.   hydrochlorothiazide 25 MG tablet Commonly known as:  HYDRODIURIL Take 25 mg by mouth daily.   levofloxacin 750 MG tablet Commonly known as:  LEVAQUIN Take 1 tablet (750 mg total) by mouth daily. Start taking on:  12/04/2016   polyethylene glycol packet Commonly known as:  MIRALAX / GLYCOLAX Take 17 g by mouth daily. Start taking on:  12/04/2016 What changed:  when to take this  reasons to take this   predniSONE 10 MG tablet Commonly known as:  DELTASONE Take 40mg  po daily for 2 days then 30mg  daily for 2 days then  20mg  daily for 2 days then 10mg  daily for 2 days then stop   tiotropium 18 MCG inhalation capsule Commonly known as:  SPIRIVA Place 18 mcg into inhaler and inhale daily.       Allergies  Allergen Reactions  . Morphine Itching    Consultations:     Procedures/Studies: Dg Chest 2 View  Result Date: 12/02/2016 CLINICAL DATA:  Dyspnea EXAM: CHEST  2 VIEW COMPARISON:  11/30/2016 chest radiograph. FINDINGS: Stable moderate right hemidiaphragmatic  eventration. Stable cardiomediastinal silhouette with normal heart size and aortic atherosclerosis. No pneumothorax. No pleural effusion. No pulmonary edema. Stable mild right basilar atelectasis. No acute consolidative airspace disease. Visualized portions of the right VP shunt appear intact. IMPRESSION: 1. No acute cardiopulmonary disease. 2. Stable right hemidiaphragmatic eventration with mild right basilar atelectasis. 3. Aortic atherosclerosis. Electronically Signed   By: Delbert PhenixJason A Poff M.D.   On: 12/02/2016 07:33   Dg Chest 2 View  Result Date: 11/27/2016 CLINICAL DATA:  Mid chest pain and productive cough with dyspnea times 2-3 days EXAM: CHEST  2 VIEW COMPARISON:  07/03/2016 CXR FINDINGS: Heart size is normal. Tortuous thoracic aorta with atherosclerosis, unchanged in appearance. Chronic elevation of right hemidiaphragm with eventration and right lower lobe posterior atelectasis. No overt pulmonary edema, effusion or pneumothorax. Degenerative changes are seen along the dorsal spine. Shunt catheter projects over the right hemithorax. IMPRESSION: Chronic elevation of the right hemidiaphragm with adjacent atelectasis. Aortic atherosclerosis. No active disease. Electronically Signed   By: Tollie Ethavid  Kwon M.D.   On: 11/27/2016 20:37   Ct Abdomen Pelvis W Contrast  Result Date: 12/02/2016 CLINICAL DATA:  Chest pain and dyspnea. Rectal bleeding. Abdominal distention. Recent hospital discharge. EXAM: CT ABDOMEN AND PELVIS WITH CONTRAST TECHNIQUE: Multidetector CT imaging of the abdomen and pelvis was performed using the standard protocol following bolus administration of intravenous contrast. CONTRAST:  100ml ISOVUE-300 IOPAMIDOL (ISOVUE-300) INJECTION 61% COMPARISON:  07/29/2011 FINDINGS: Lower chest: There is chronic elevation of the right hemidiaphragm with adjacent chronic volume loss in the right lung base due to scarring or atelectasis. This is unchanged. No acute findings. Hepatobiliary: No focal liver  lesions. Gallbladder and bile ducts appear unremarkable. Pancreas: Unremarkable. No pancreatic ductal dilatation or surrounding inflammatory changes. Spleen: Normal in size without focal abnormality. Adrenals/Urinary Tract: Water density 3.5 cm lower pole left renal lesion, indeterminate but probably a benign cyst. 3 mm calculus of the upper pole left renal collecting system. No suspicious focal renal lesions. No hydronephrosis or ureteral dilatation. No ureteral calculi. Urinary bladder is unremarkable. Stomach/Bowel: Stomach is within normal limits. Appendix is normal. No evidence of bowel wall thickening, distention, or inflammatory changes. Vascular/Lymphatic: No significant vascular findings are present. No enlarged abdominal or pelvic lymph nodes. Reproductive: Unremarkable Other: Ventriculoperitoneal shunt tubing noted. No focal inflammation. Small volume peritoneal fluid may be related to the ventriculoperitoneal shunt tube. Musculoskeletal: No significant skeletal lesion. IMPRESSION: 1. Unchanged chronic right hemidiaphragm elevation with adjacent scarring or atelectasis the right lung base. 2. Left nephrolithiasis, nonobstructing. 3. Several water density lesions of the kidneys, not conclusively characterize but more likely benign cysts. 4. Small volume peritoneal fluid, probably related to the ventriculoperitoneal shunt tubing. Electronically Signed   By: Ellery Plunkaniel R Mitchell M.D.   On: 12/02/2016 04:37   Dg Chest Port 1 View  Result Date: 11/30/2016 CLINICAL DATA:  Wheezing EXAM: PORTABLE CHEST 1 VIEW COMPARISON:  11/27/2016; 07/03/2016; 04/27/2015; chest CT - 07/08/2016 FINDINGS: Grossly unchanged cardiac silhouette and mediastinal contours given reduced lung volumes. Persistent mild  tortuosity of the thoracic aorta. There is persistent mild elevation of the left hemidiaphragm. No focal airspace opacities. No pleural effusion or pneumothorax. No evidence of edema. No acute osseus abnormalities. Presumed  ventriculoperitoneal catheter tubing overlies the right midclavicular line, unchanged. IMPRESSION: No acute cardiopulmonary disease on this hypoventilated AP portable examination. Further evaluation with a PA and lateral chest radiograph may be obtained as clinically indicated. Electronically Signed   By: Simonne Come M.D.   On: 11/30/2016 13:13      Subjective: Says he feels short of breath and wheezing, but symptoms appear to resolve when he is told he will be discharged today. Constipation resolved after enema yesterday  Discharge Exam: Vitals:   12/02/16 2231 12/03/16 0540  BP: (!) 146/83 (!) 159/88  Pulse: 91 82  Resp: 18 18  Temp: 98.5 F (36.9 C) 98.6 F (37 C)   Vitals:   12/03/16 0735 12/03/16 0742 12/03/16 1115 12/03/16 1120  BP:      Pulse:      Resp:      Temp:      TempSrc:      SpO2: 91% 91% 100% 95%  Weight:      Height:        General: Pt is alert, awake, not in acute distress Cardiovascular: RRR, S1/S2 +, no rubs, no gallops Respiratory: CTA bilaterally, no wheezing, no rhonchi Abdominal: Soft, NT, ND, bowel sounds + Extremities: no edema, no cyanosis    The results of significant diagnostics from this hospitalization (including imaging, microbiology, ancillary and laboratory) are listed below for reference.     Microbiology: Recent Results (from the past 240 hour(s))  MRSA PCR Screening     Status: None   Collection Time: 11/28/16 12:59 AM  Result Value Ref Range Status   MRSA by PCR NEGATIVE NEGATIVE Final    Comment:        The GeneXpert MRSA Assay (FDA approved for NASAL specimens only), is one component of a comprehensive MRSA colonization surveillance program. It is not intended to diagnose MRSA infection nor to guide or monitor treatment for MRSA infections.      Labs: BNP (last 3 results)  Recent Labs  04/13/16 2029 11/27/16 2001 12/02/16 0059  BNP 15.0 15.0 92.0   Basic Metabolic Panel:  Recent Labs Lab 11/27/16 2001  11/28/16 0420 11/28/16 1137 11/29/16 0802 12/02/16 0058  NA 140 137 136 136 138  K 2.5* 2.4* 3.7 3.8 4.1  CL 97* 97* 100* 101 102  CO2 36* 26 28 28 30   GLUCOSE 133* 302* 196* 184* 158*  BUN 17 18 17 19  21*  CREATININE 1.26* 1.42* 1.21 1.14 0.89  CALCIUM 9.6 9.2 9.1 8.7* 8.6*   Liver Function Tests:  Recent Labs Lab 12/02/16 0058  AST 15  ALT 13*  ALKPHOS 56  BILITOT 0.7  PROT 6.9  ALBUMIN 3.4*    Recent Labs Lab 12/02/16 0058  LIPASE 16   No results for input(s): AMMONIA in the last 168 hours. CBC:  Recent Labs Lab 11/27/16 2001 11/28/16 0420 12/02/16 0058  WBC 8.6 9.1 17.6*  NEUTROABS  --   --  14.3*  HGB 15.1 13.9 14.7  HCT 45.0 42.1 45.1  MCV 92.6 92.5 94.9  PLT 254 267 241   Cardiac Enzymes:  Recent Labs Lab 11/28/16 0420 11/30/16 1426 11/30/16 2007 12/01/16 0153 12/02/16 0058  TROPONINI <0.03 <0.03 <0.03 <0.03 <0.03   BNP: Invalid input(s): POCBNP CBG:  Recent Labs Lab 11/30/16 1628 11/30/16 2113  12/01/16 0743 12/01/16 1102 12/01/16 1603  GLUCAP 222* 239* 202* 195* 172*   D-Dimer No results for input(s): DDIMER in the last 72 hours. Hgb A1c No results for input(s): HGBA1C in the last 72 hours. Lipid Profile No results for input(s): CHOL, HDL, LDLCALC, TRIG, CHOLHDL, LDLDIRECT in the last 72 hours. Thyroid function studies No results for input(s): TSH, T4TOTAL, T3FREE, THYROIDAB in the last 72 hours.  Invalid input(s): FREET3 Anemia work up No results for input(s): VITAMINB12, FOLATE, FERRITIN, TIBC, IRON, RETICCTPCT in the last 72 hours. Urinalysis    Component Value Date/Time   COLORURINE YELLOW 12/02/2016 0102   APPEARANCEUR CLEAR 12/02/2016 0102   LABSPEC 1.023 12/02/2016 0102   PHURINE 7.0 12/02/2016 0102   GLUCOSEU 150 (A) 12/02/2016 0102   HGBUR NEGATIVE 12/02/2016 0102   BILIRUBINUR NEGATIVE 12/02/2016 0102   KETONESUR NEGATIVE 12/02/2016 0102   PROTEINUR NEGATIVE 12/02/2016 0102   UROBILINOGEN 0.2 04/23/2015  1940   NITRITE NEGATIVE 12/02/2016 0102   LEUKOCYTESUR NEGATIVE 12/02/2016 0102   Sepsis Labs Invalid input(s): PROCALCITONIN,  WBC,  LACTICIDVEN Microbiology Recent Results (from the past 240 hour(s))  MRSA PCR Screening     Status: None   Collection Time: 11/28/16 12:59 AM  Result Value Ref Range Status   MRSA by PCR NEGATIVE NEGATIVE Final    Comment:        The GeneXpert MRSA Assay (FDA approved for NASAL specimens only), is one component of a comprehensive MRSA colonization surveillance program. It is not intended to diagnose MRSA infection nor to guide or monitor treatment for MRSA infections.      Time coordinating discharge: Over 30 minutes  SIGNED:   Erick Blinks, MD  [facility] 12/03/2016, 11:29 AM Pager   If 7PM-7AM, please contact night-coverage [email or web address] Password [email or web address]

## 2016-12-03 NOTE — Progress Notes (Signed)
Pt discharged in stable condition into the care of the sheriff's deputy via wheelchair via private vehicle.  PIV removed intact and w/o S&S of complications.  Discharge instructions reviewed with pt.  Pt verbalized understanding.

## 2017-09-27 ENCOUNTER — Other Ambulatory Visit: Payer: Self-pay

## 2017-09-27 ENCOUNTER — Encounter (HOSPITAL_COMMUNITY): Payer: Self-pay | Admitting: Emergency Medicine

## 2017-09-27 ENCOUNTER — Emergency Department (HOSPITAL_COMMUNITY): Payer: Medicaid Other

## 2017-09-27 ENCOUNTER — Emergency Department (HOSPITAL_COMMUNITY)
Admission: EM | Admit: 2017-09-27 | Discharge: 2017-09-27 | Disposition: A | Discharge status: Home / Self Care | Payer: Medicaid Other | Attending: Emergency Medicine | Admitting: Emergency Medicine

## 2017-09-27 DIAGNOSIS — E119 Type 2 diabetes mellitus without complications: Secondary | ICD-10-CM | POA: Diagnosis not present

## 2017-09-27 DIAGNOSIS — I5030 Unspecified diastolic (congestive) heart failure: Secondary | ICD-10-CM | POA: Insufficient documentation

## 2017-09-27 DIAGNOSIS — J441 Chronic obstructive pulmonary disease with (acute) exacerbation: Secondary | ICD-10-CM

## 2017-09-27 DIAGNOSIS — I110 Hypertensive heart disease with heart failure: Secondary | ICD-10-CM | POA: Insufficient documentation

## 2017-09-27 DIAGNOSIS — Z79899 Other long term (current) drug therapy: Secondary | ICD-10-CM | POA: Diagnosis not present

## 2017-09-27 DIAGNOSIS — R0602 Shortness of breath: Secondary | ICD-10-CM | POA: Diagnosis present

## 2017-09-27 DIAGNOSIS — W57XXXA Bitten or stung by nonvenomous insect and other nonvenomous arthropods, initial encounter: Secondary | ICD-10-CM

## 2017-09-27 LAB — CBC WITH DIFFERENTIAL/PLATELET
BASOS PCT: 0 %
Basophils Absolute: 0 10*3/uL (ref 0.0–0.1)
EOS ABS: 0.2 10*3/uL (ref 0.0–0.7)
EOS PCT: 2 %
HCT: 43.5 % (ref 39.0–52.0)
HEMOGLOBIN: 14.2 g/dL (ref 13.0–17.0)
Lymphocytes Relative: 39 %
Lymphs Abs: 2.8 10*3/uL (ref 0.7–4.0)
MCH: 31.6 pg (ref 26.0–34.0)
MCHC: 32.6 g/dL (ref 30.0–36.0)
MCV: 96.9 fL (ref 78.0–100.0)
MONO ABS: 0.6 10*3/uL (ref 0.1–1.0)
MONOS PCT: 8 %
NEUTROS PCT: 51 %
Neutro Abs: 3.6 10*3/uL (ref 1.7–7.7)
PLATELETS: 200 10*3/uL (ref 150–400)
RBC: 4.49 MIL/uL (ref 4.22–5.81)
RDW: 13.1 % (ref 11.5–15.5)
WBC: 7.1 10*3/uL (ref 4.0–10.5)

## 2017-09-27 LAB — COMPREHENSIVE METABOLIC PANEL
ALBUMIN: 3.8 g/dL (ref 3.5–5.0)
ALT: 16 U/L — ABNORMAL LOW (ref 17–63)
ANION GAP: 9 (ref 5–15)
AST: 33 U/L (ref 15–41)
Alkaline Phosphatase: 63 U/L (ref 38–126)
BUN: 13 mg/dL (ref 6–20)
CHLORIDE: 104 mmol/L (ref 101–111)
CO2: 30 mmol/L (ref 22–32)
Calcium: 8.7 mg/dL — ABNORMAL LOW (ref 8.9–10.3)
Creatinine, Ser: 1.29 mg/dL — ABNORMAL HIGH (ref 0.61–1.24)
GFR calc Af Amer: >60 mL/min (ref >60)
GFR calc non Af Amer: 59 mL/min — ABNORMAL LOW (ref >60)
GLUCOSE: 134 mg/dL — AB (ref 65–99)
POTASSIUM: 3 mmol/L — AB (ref 3.5–5.1)
SODIUM: 143 mmol/L (ref 135–145)
Total Bilirubin: 0.9 mg/dL (ref 0.3–1.2)
Total Protein: 7.4 g/dL (ref 6.5–8.1)

## 2017-09-27 MED ORDER — ALBUTEROL (5 MG/ML) CONTINUOUS INHALATION SOLN
10.0000 mg/h | INHALATION_SOLUTION | RESPIRATORY_TRACT | Status: DC
  Administered 2017-09-27: 10 mg/h via RESPIRATORY_TRACT
  Filled 2017-09-27: qty 20

## 2017-09-27 MED ORDER — PREDNISONE 20 MG PO TABS: ORAL_TABLET | ORAL | 0 refills | Status: DC

## 2017-09-27 MED ORDER — ALBUTEROL SULFATE (2.5 MG/3ML) 0.083% IN NEBU
5.0000 mg | INHALATION_SOLUTION | Freq: Once | RESPIRATORY_TRACT | Status: AC
  Administered 2017-09-27: 5 mg via RESPIRATORY_TRACT
  Filled 2017-09-27: qty 6

## 2017-09-27 MED ORDER — ALBUTEROL SULFATE HFA 108 (90 BASE) MCG/ACT IN AERS
1.0000 | INHALATION_SPRAY | RESPIRATORY_TRACT | Status: DC | PRN
  Filled 2017-09-27: qty 6.7

## 2017-09-27 MED ORDER — METHYLPREDNISOLONE SODIUM SUCC 125 MG IJ SOLR
125.0000 mg | Freq: Once | INTRAMUSCULAR | Status: AC
  Administered 2017-09-27: 125 mg via INTRAMUSCULAR
  Filled 2017-09-27: qty 2

## 2017-09-27 NOTE — ED Provider Notes (Signed)
[facility] Provider Note   CSN: [id number] Arrival date & time: 09/27/17  1244     History   Chief Complaint Chief Complaint  Patient presents with  . Shortness of Breath    HPI Vernon MarshallJimmy Mendoza is a 62 y.o. male.  HPI Patient presents with several days of worsening shortness of breath.  States he has associated wheezing and cough productive of yellow sputum.  Endorses subjective fevers and chills.  Also complains of itching rash mostly to the back and abdomen.  This been present for several days.  No known insect bites.  Patient states he is homeless. Past Medical History:  Diagnosis Date  . Abdominal hernia   . Asthma   . BPH (benign prostatic hyperplasia)   . Chronic pain   . COPD (chronic obstructive pulmonary disease) (HCC)   . Diabetes mellitus   . Diastolic CHF (HCC)   . Headache(784.0)    chronic, daily  . Hyperlipidemia   . Hypertension   . 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 COPD exacerbation (HCC) 12/02/2016  . Constipation 12/02/2016  . Acute chronic obstructive pulmonary disease with respiratory failure (HCC) 11/27/2016  . Chest pain   . Dyspnea   . Abdominal distention   . Community acquired pneumonia of right lower lobe of lung (HCC) 07/02/2016  . Leukocytosis 05/21/2016  . COPD with acute exacerbation (HCC) 05/20/2016  . Acute respiratory failure (HCC) 05/20/2016  . Hypokalemia 05/20/2016  . Obesity 04/28/2013  . COPD with exacerbation (HCC) 08/05/2012  . Rotator cuff tear, left 05/22/2012  . Rotator cuff syndrome of left shoulder 05/22/2012  . Chronic pain 02/04/2012  . Environmental allergies 02/04/2012  . COPD (chronic obstructive pulmonary disease) (HCC) 12/05/2011  . Hyperlipidemia 11/20/2011  . Headache, chronic daily 11/20/2011  . 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 Seizure prophylaxis 11/20/2011  . Substance abuse (HCC) 11/20/2011  . Diabetes  mellitus type II, controlled (HCC) 10/26/2009  . HTN (hypertension) 10/26/2009  . CHF 10/26/2009    Past Surgical History:  Procedure Laterality Date  . BRAIN SURGERY    . COLON SURGERY  06/2011   Diverticulitis Complicated  . MANDIBLE SURGERY    . VENTRICULOPERITONEAL SHUNT         Home Medications    Prior to Admission medications   Medication Sig Start Date End Date Taking? Authorizing Provider  albuterol (PROVENTIL) (2.5 MG/3ML) 0.083% nebulizer solution Take 3 mLs (2.5 mg total) by nebulization 3 (three) times daily. 12/03/16  Yes Erick BlinksMemon, Jehanzeb, MD  predniSONE (DELTASONE) 20 MG tablet 3 tabs po day one, then 2 po daily x 4 days 09/27/17   Loren RacerYelverton, Czarina Gingras, MD    Family History Family History  Problem Relation Age of Onset  . Hypertension Mother   . Hyperlipidemia Mother   . Depression Mother   . Hypertension Father   . Hyperlipidemia Father   . Diabetes Father   . Arthritis Unknown   . Asthma Unknown     Social History Social History   Tobacco Use  . Smoking status: Never Smoker  . Smokeless tobacco: Never Used  Substance Use Topics  . Alcohol use: No  . Drug use: No    Comment: Quit 2012     Allergies   Morphine   Review of Systems Review of Systems  Constitutional: Negative for chills and fever.  Respiratory: Positive for cough, shortness of  breath and wheezing.   Cardiovascular: Negative for chest pain, palpitations and leg swelling.  Gastrointestinal: Negative for abdominal pain, diarrhea, nausea and vomiting.  Musculoskeletal: Negative for back pain, myalgias, neck pain and neck stiffness.  Skin: Positive for rash. Negative for wound.  Neurological: Negative for dizziness, weakness, numbness and headaches.  All other systems reviewed and are negative.    Physical Exam Updated Vital Signs BP (!) 165/99   Pulse 81   Temp 98.2 F (36.8 C)   Resp 19   Ht 5\' 8"  (1.727 m)   Wt 109.8 kg (242 lb)   SpO2 100%   BMI 36.80 kg/m   Physical  Exam  Constitutional: He is oriented to person, place, and time. He appears well-developed and well-nourished.  Non-toxic appearance. He does not appear ill. No distress.  HENT:  Head: Normocephalic and atraumatic.  Mouth/Throat: Oropharynx is clear and moist.  Eyes: EOM are normal. Pupils are equal, round, and reactive to light.  Neck: Normal range of motion. Neck supple.  Cardiovascular: Normal rate and regular rhythm.  Pulmonary/Chest: Effort normal. He has wheezes.  Diminished air movement especially in the right lung field.  Expiratory wheezing.  Abdominal: Soft. Bowel sounds are normal. There is no tenderness. There is no rebound and no guarding. A hernia is present.  Ventral wall hernia.  Musculoskeletal: Normal range of motion. He exhibits no edema or tenderness.  Neurological: He is alert and oriented to person, place, and time.  Skin: Skin is warm and dry. Capillary refill takes less than 2 seconds. Rash noted. He is not diaphoretic. No erythema.  Patient with erythematous macular rash to the trunk and extremities.  There are some section where there is a linear pattern.  Evidence of excoriation.  No secondary infection  Psychiatric: He has a normal mood and affect. His behavior is normal.  Nursing note and vitals reviewed.    ED Treatments / Results  Labs (all labs ordered are listed, but only abnormal results are displayed) Labs Reviewed  COMPREHENSIVE METABOLIC PANEL - Abnormal; Notable for the following components:      Result Value   Potassium 3.0 (*)    Glucose, Bld 134 (*)    Creatinine, Ser 1.29 (*)    Calcium 8.7 (*)    ALT 16 (*)    GFR calc non Af Amer 59 (*)    All other components within normal limits  CBC WITH DIFFERENTIAL/PLATELET    EKG  EKG Interpretation  Date/Time:  Friday September 27 2017 12:58:27 EST Ventricular Rate:  81 PR Interval:    QRS Duration: 90 QT Interval:  400 QTC Calculation: 465 R Axis:   -21 Text Interpretation:  Sinus rhythm  Borderline prolonged PR interval Abnormal R-wave progression, early transition Inferior infarct, old Lateral leads are also involved Confirmed by Loren Racer ([id number]) on 09/27/2017 1:21:30 PM       Radiology Dg Chest 2 View  Result Date: 09/27/2017 CLINICAL DATA:  Productive cough, chills, shortness of breath, chest pain EXAM: CHEST  2 VIEW COMPARISON:  12/02/2016 FINDINGS: There is chronic elevation of the right diaphragm. There is no focal parenchymal opacity. There is no pleural effusion or pneumothorax. The heart and mediastinal contours are unremarkable. There is ventriculoperitoneal shunt catheter tubing noted. The osseous structures are unremarkable. IMPRESSION: No active cardiopulmonary disease. Electronically Signed   By: Elige Ko   On: 09/27/2017 14:07    Procedures Procedures (including critical care time)  Medications Ordered in ED Medications  albuterol (PROVENTIL) (  2.5 MG/3ML) 0.083% nebulizer solution 5 mg (5 mg Nebulization Given 09/27/17 1258)  methylPREDNISolone sodium succinate (SOLU-MEDROL) 125 mg/2 mL injection 125 mg (125 mg Intramuscular Given 09/27/17 1428)     Initial Impression / Assessment and Plan / ED Course  I have reviewed the triage vital signs and the nursing notes.  Pertinent labs & imaging results that were available during my care of the patient were reviewed by me and considered in my medical decision making (see chart for details).     Patient is feeling better after nebulized treatment x2.  Will give short course of steroids.  Return precautions given.  Final Clinical Impressions(s) / ED Diagnoses   Final diagnoses:  COPD exacerbation (HCC)  Insect bite, initial encounter    ED Discharge Orders        Ordered    predniSONE (DELTASONE) 20 MG tablet     09/27/17 1604       Loren Racer, MD 09/28/17 1609

## 2017-09-27 NOTE — ED Triage Notes (Signed)
Pt reports shortness of breath for the last 2 days No PCP per pt report

## 2017-09-27 NOTE — ED Notes (Signed)
MDI given to patient per MD order with Spacer. Pt states he does not need inhaler at this time.

## 2017-11-04 IMAGING — DX DG CHEST 2V
2 series · 2 of 2 positions shown · non-contrast
Comparison: Portable chest x-ray July 02, 2016 and May 20, 2016 and PA and lateral chest x-ray November 12, 2015.

CLINICAL DATA: Onset of cough and wheezing today. Follow-up of
earlier pneumonia

EXAM:
CHEST  2 VIEW

[w chest pa]
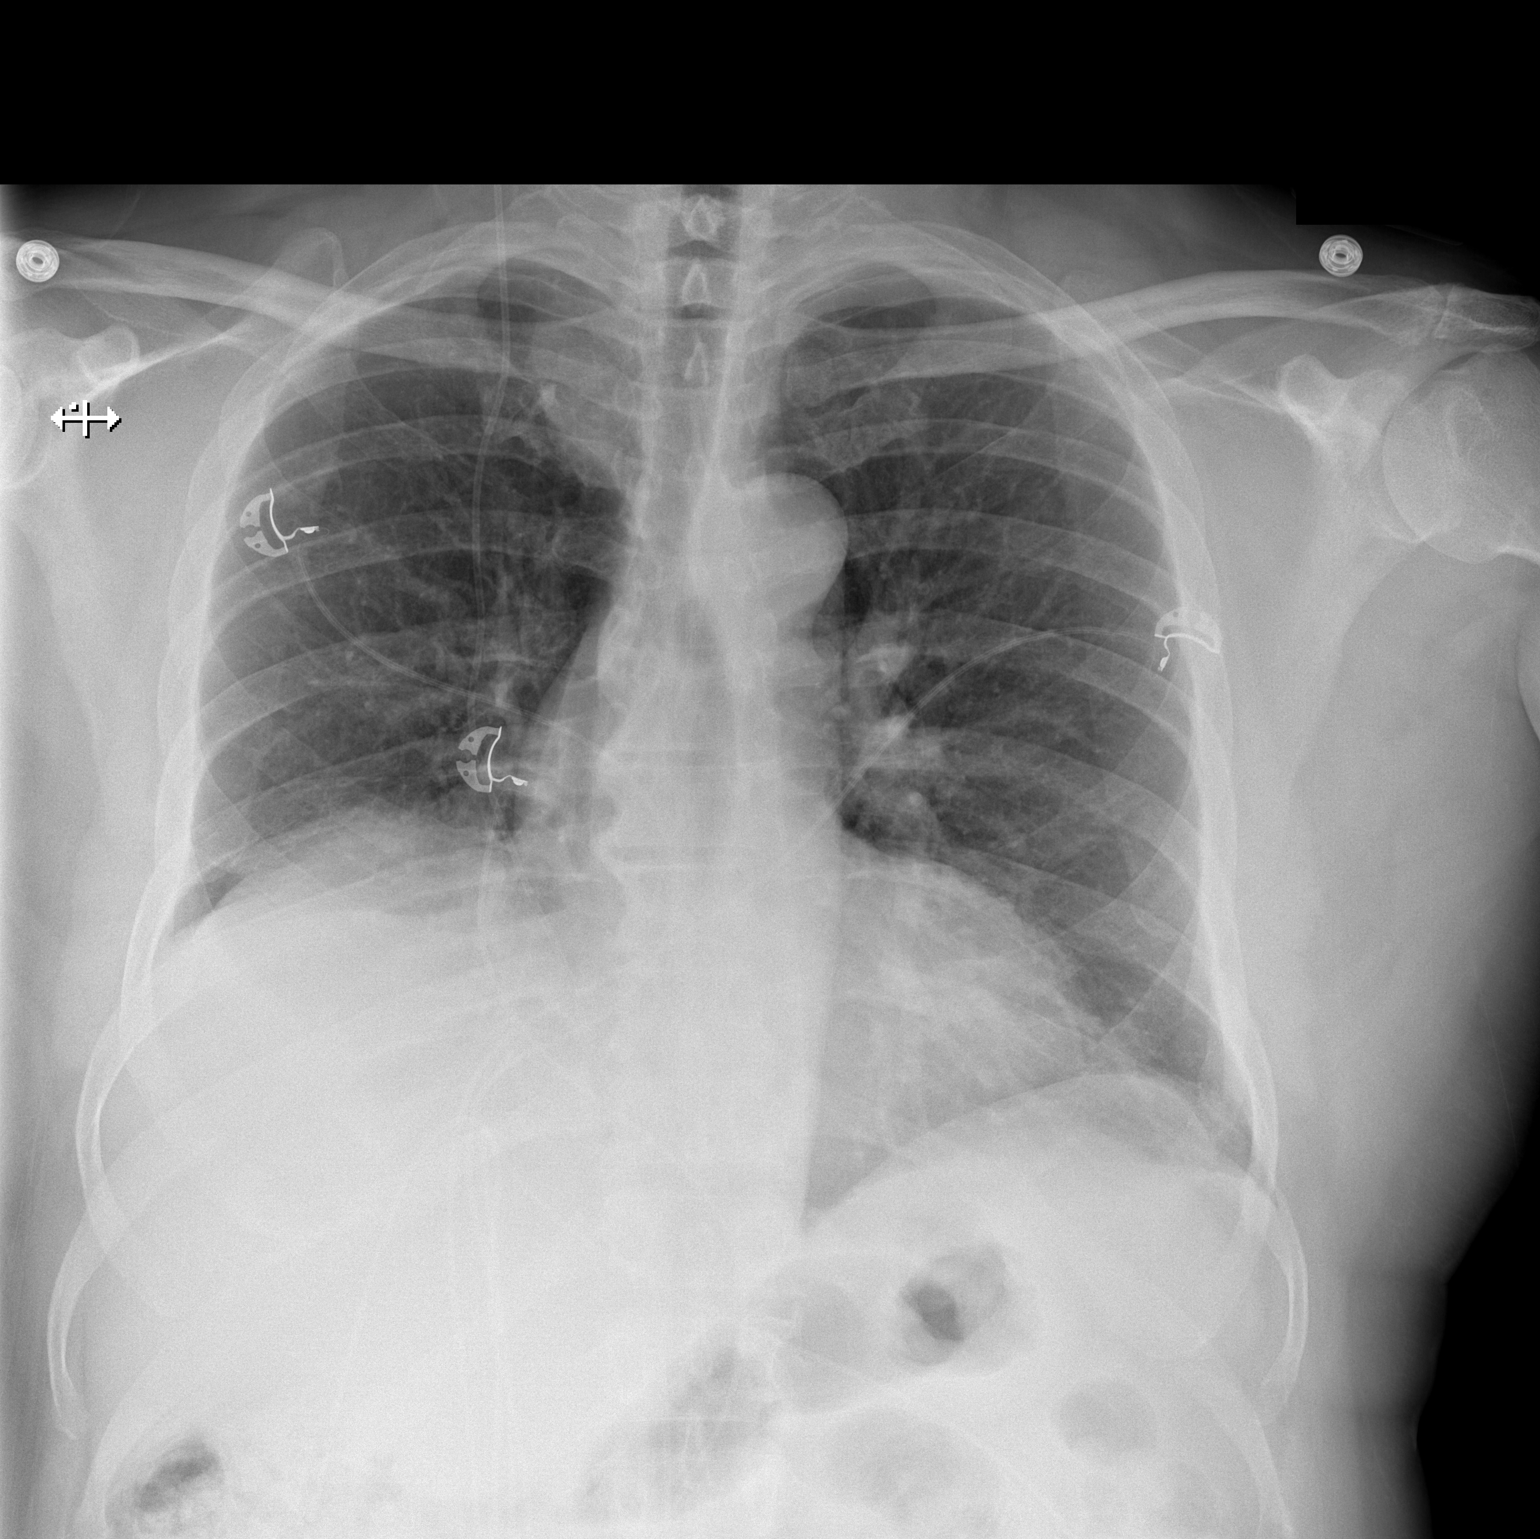

[w chest lat]
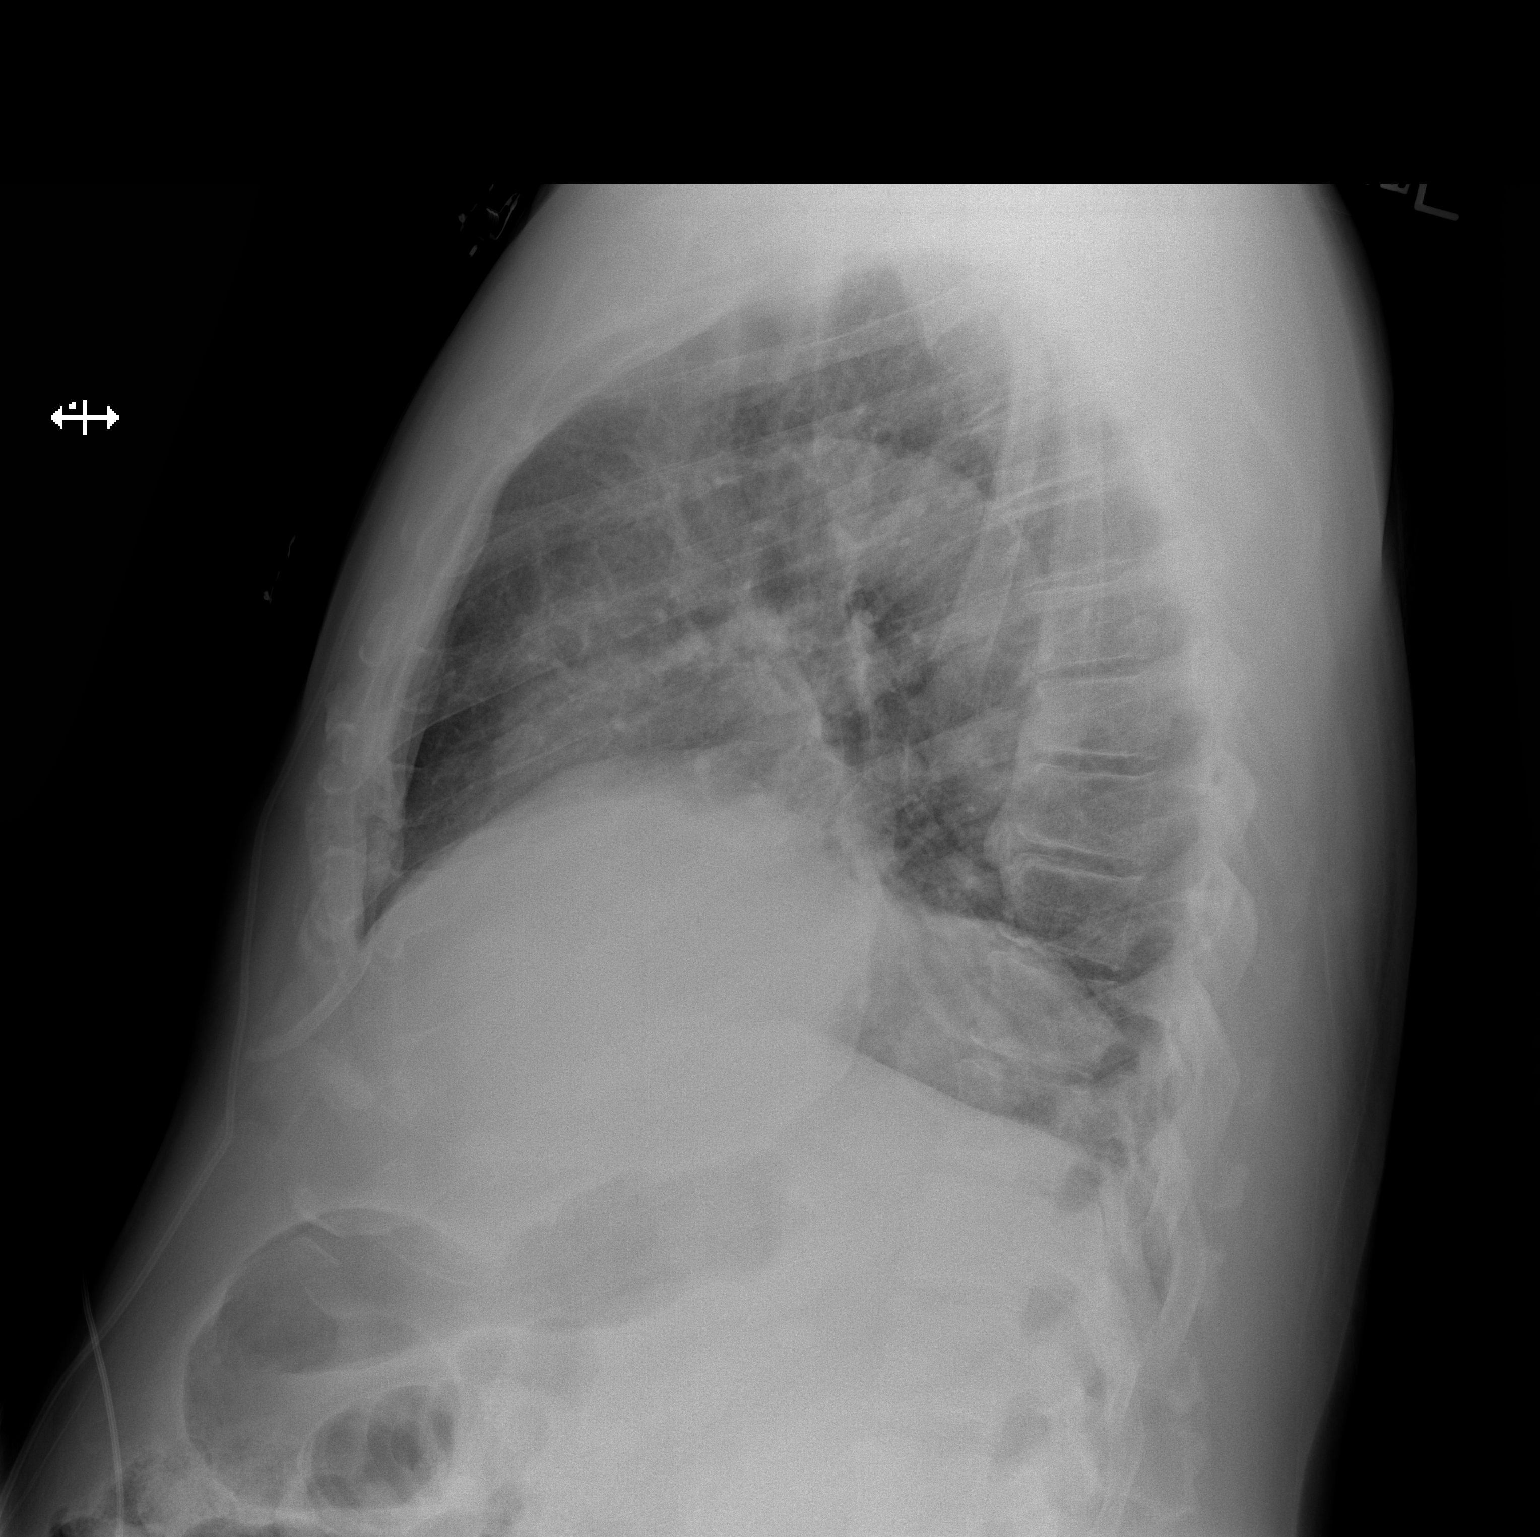

[2 of 2 positions shown; findings below may reference images not displayed]

FINDINGS: There remains increased density at the right lung base. There is
chronic elevation of the right hemidiaphragm. The left lung is
clear. The heart and pulmonary vascularity are normal. The
mediastinum is normal in width. There is calcification in the wall
of the aortic arch. The bony thorax is unremarkable.
IMPRESSION: Persistent atelectasis or infiltrate in the right lower lobe
posteriorly.

Aortic atherosclerosis.

## 2018-04-03 IMAGING — CR DG CHEST 1V PORT
1 series · 1 of 1 positions shown · non-contrast
Comparison: 11/27/2016; 07/03/2016; 04/27/2015; chest CT -
07/08/2016

CLINICAL DATA: Wheezing

EXAM:
PORTABLE CHEST 1 VIEW

[portable]
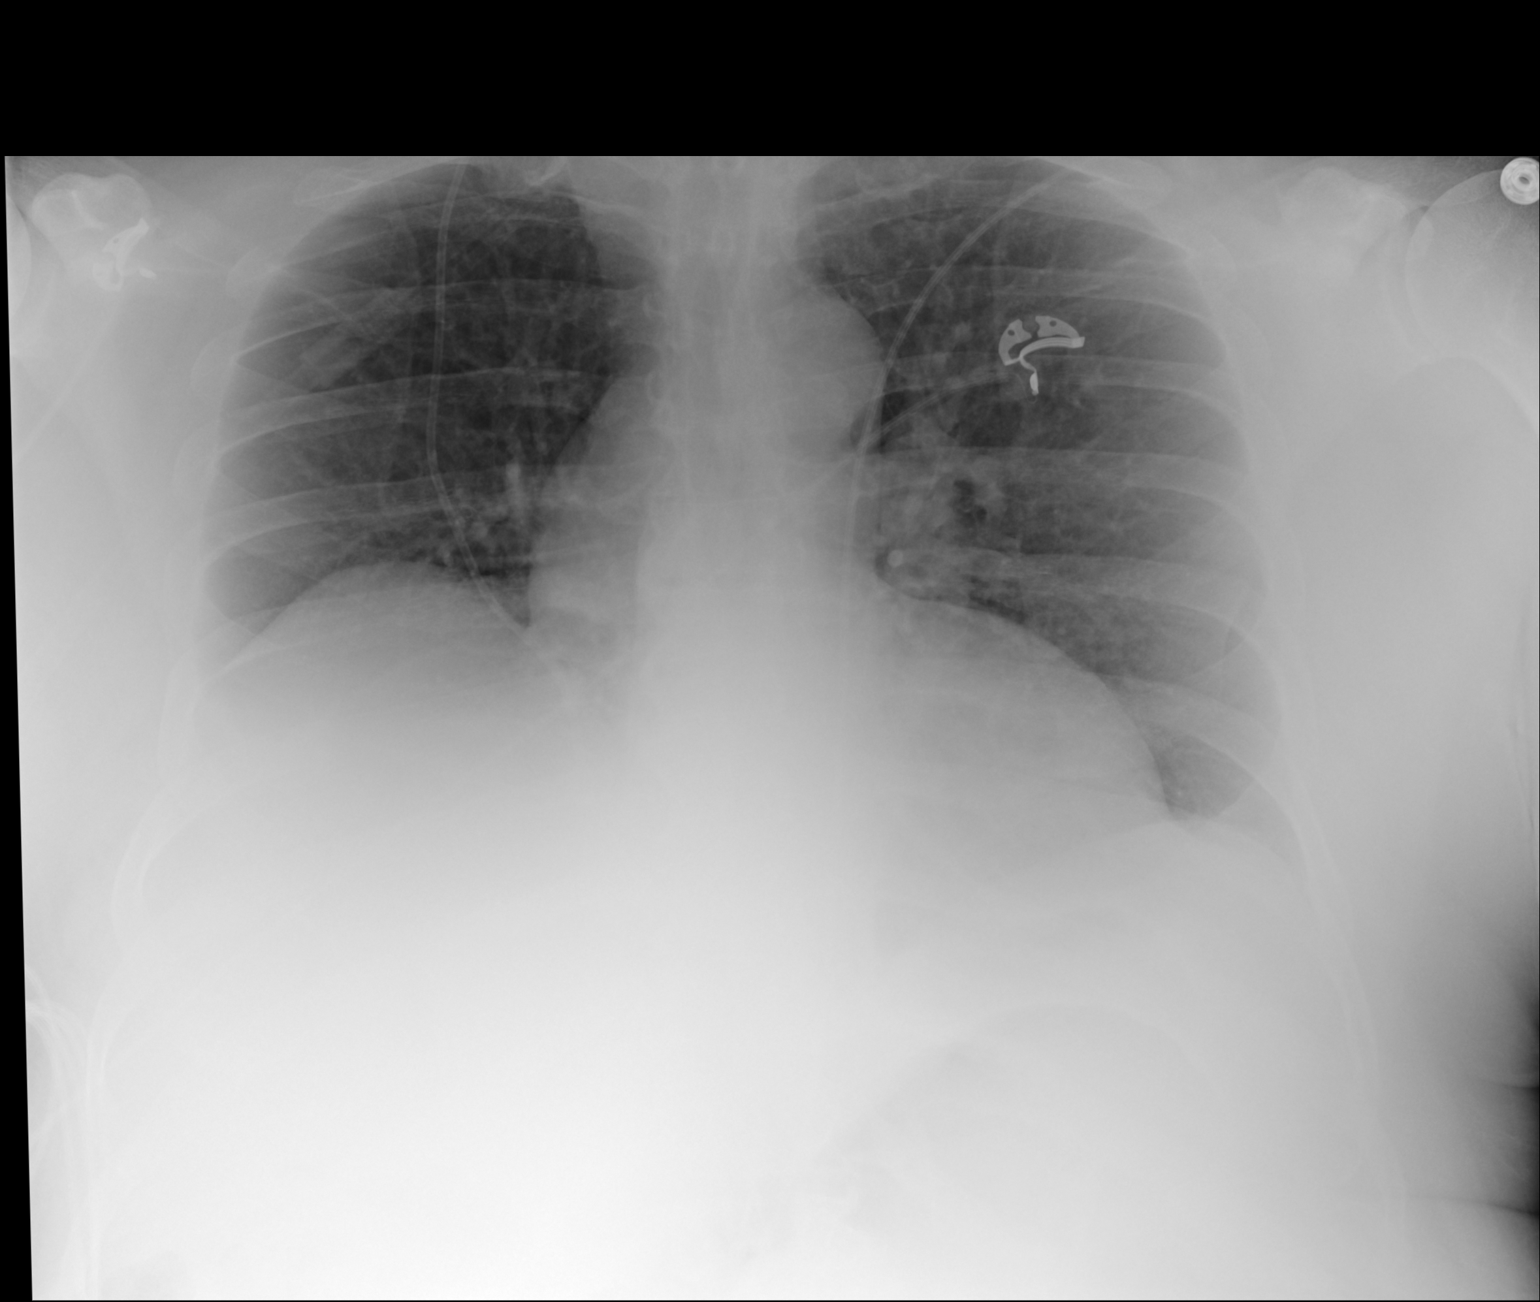

[1 of 1 positions shown; findings below may reference images not displayed]

FINDINGS: Grossly unchanged cardiac silhouette and mediastinal contours given
reduced lung volumes. Persistent mild tortuosity of the thoracic
aorta. There is persistent mild elevation of the left hemidiaphragm.
No focal airspace opacities. No pleural effusion or pneumothorax. No
evidence of edema. No acute osseus abnormalities. Presumed
ventriculoperitoneal catheter tubing overlies the right
midclavicular line, unchanged.
IMPRESSION: No acute cardiopulmonary disease on this hypoventilated AP portable
examination. Further evaluation with a PA and lateral chest
radiograph may be obtained as clinically indicated.

## 2018-04-05 IMAGING — DX DG CHEST 2V
2 series · 2 of 2 positions shown · non-contrast
Comparison: 11/30/2016 chest radiograph.

CLINICAL DATA: Dyspnea

EXAM:
CHEST  2 VIEW

[chest pa]
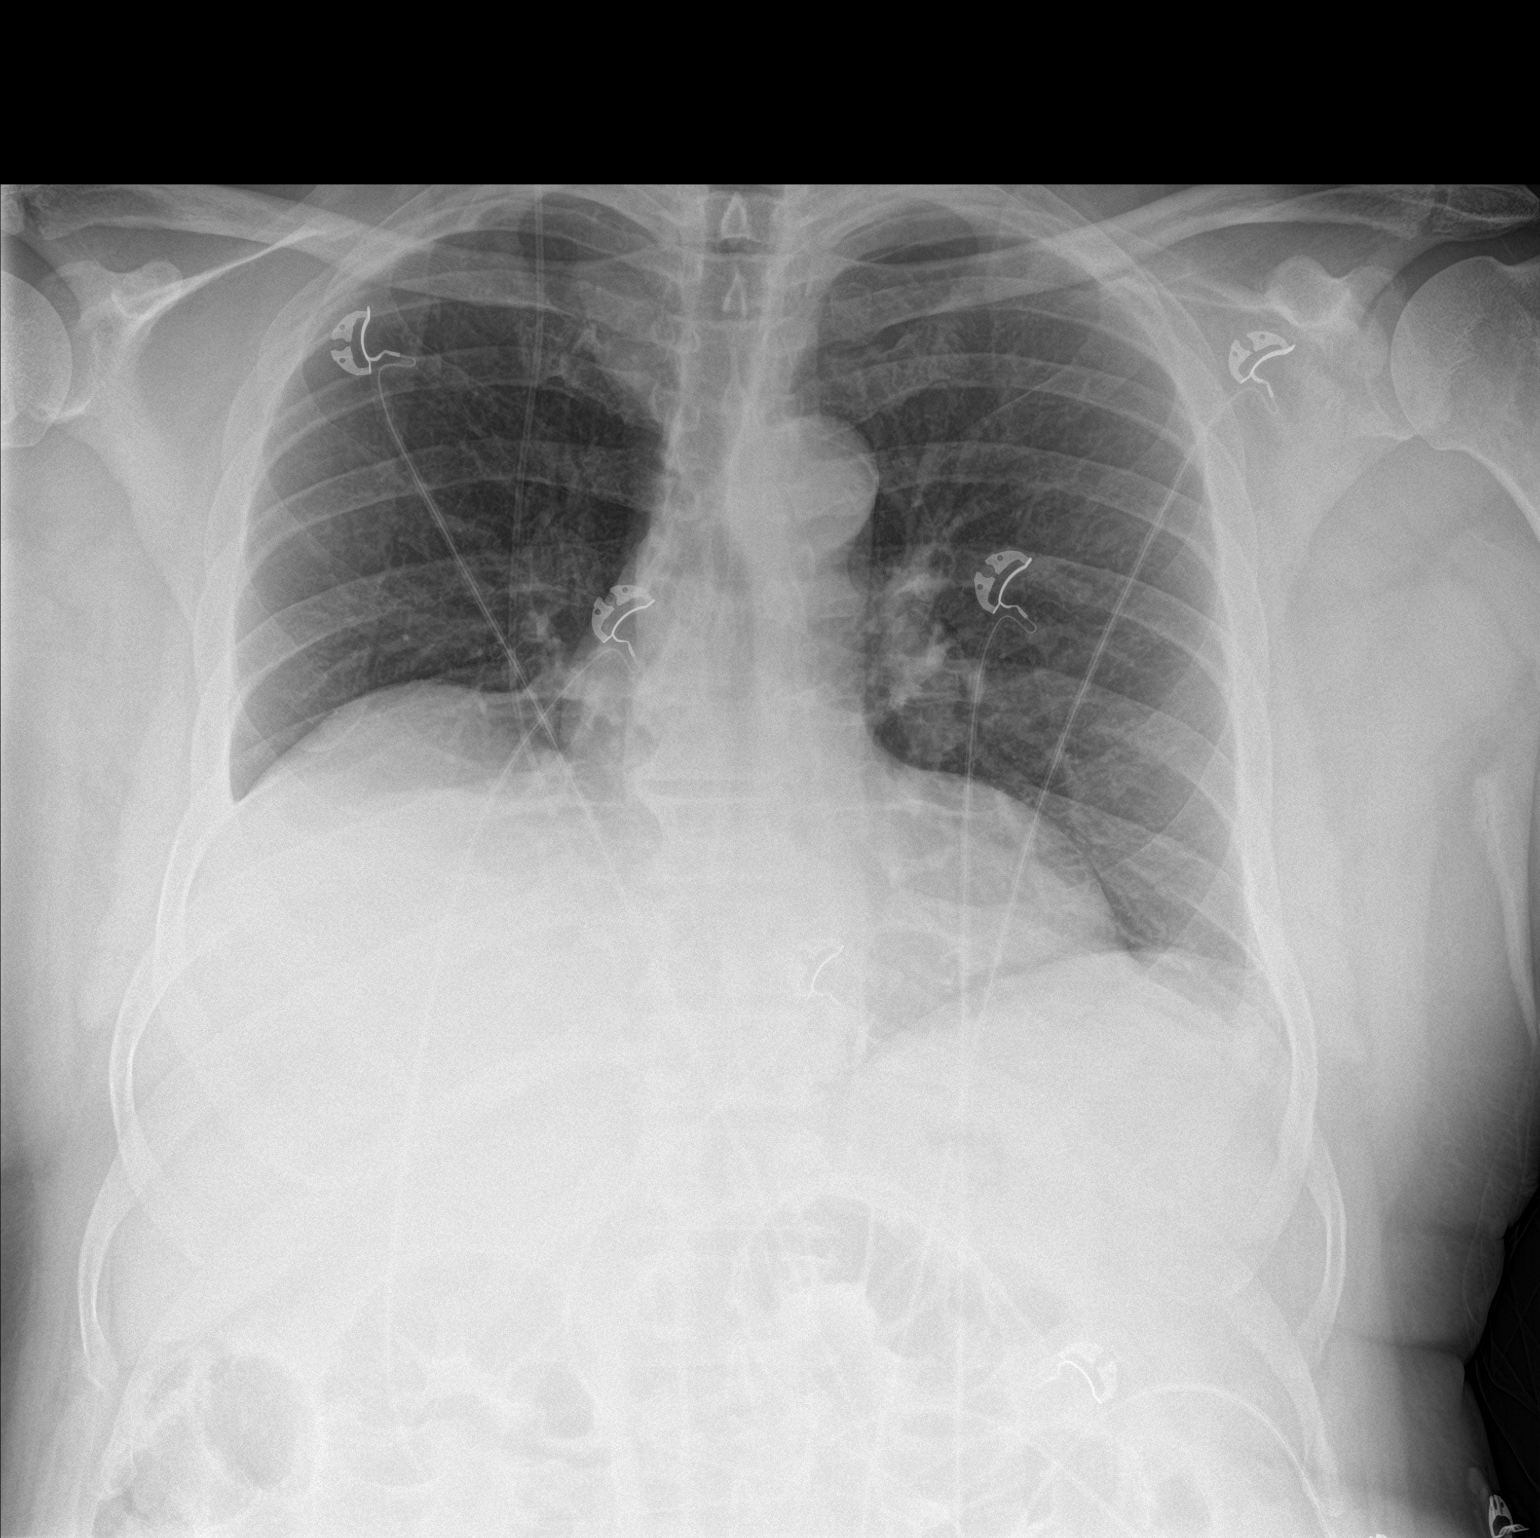

[chest lat]
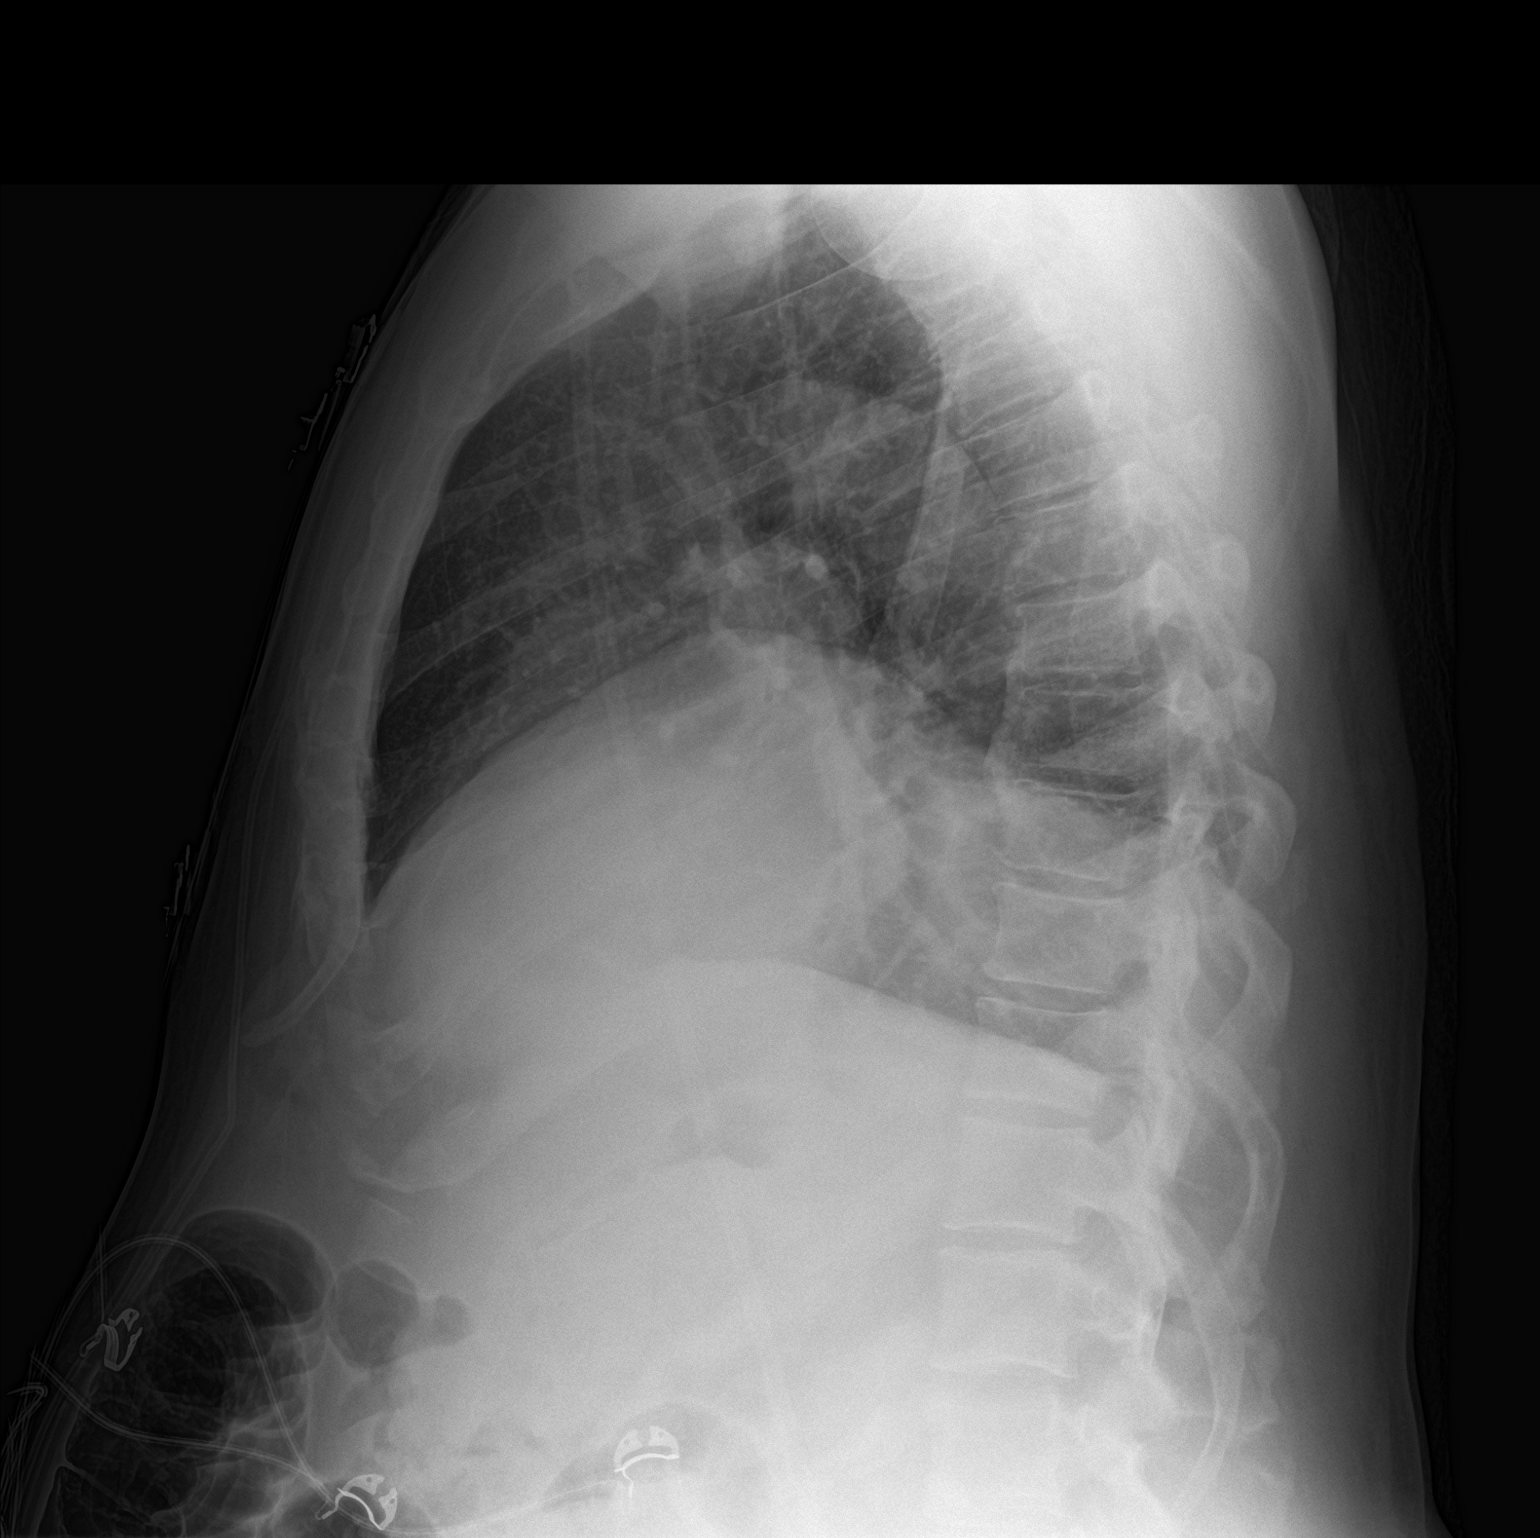

[2 of 2 positions shown; findings below may reference images not displayed]

FINDINGS: Stable moderate right hemidiaphragmatic eventration. Stable
cardiomediastinal silhouette with normal heart size and aortic
atherosclerosis. No pneumothorax. No pleural effusion. No pulmonary
edema. Stable mild right basilar atelectasis. No acute consolidative
airspace disease. Visualized portions of the right VP shunt appear
intact.
IMPRESSION: 1. No acute cardiopulmonary disease.
2. Stable right hemidiaphragmatic eventration with mild right
basilar atelectasis.
3. Aortic atherosclerosis.

## 2019-06-02 ENCOUNTER — Ambulatory Visit: Payer: Self-pay | Admitting: Family Medicine

## 2019-12-17 ENCOUNTER — Ambulatory Visit: Payer: Medicaid Other | Attending: Internal Medicine

## 2019-12-17 DIAGNOSIS — Z23 Encounter for immunization: Secondary | ICD-10-CM

## 2019-12-17 NOTE — Progress Notes (Signed)
   Covid-19 Vaccination Clinic  Name:  [name]    MRN: [id number] DOB: [birth year]  12/17/2019  [name] was observed post Covid-19 immunization for 15 minutes without incident. He was provided with Vaccine Information Sheet and instruction to access the V-Safe system.   [name] was instructed to call 911 with any severe reactions post vaccine: Marland Kitchen Difficulty breathing  . Swelling of face and throat  . A fast heartbeat  . A bad rash all over body  . Dizziness and weakness   Immunizations Administered    Name Date Dose VIS Date Route   Moderna COVID-19 Vaccine 12/17/2019 10:28 AM 0.5 mL 08/25/2019 Intramuscular   Manufacturer: Moderna   Lot: [id number]   NDC: 48016-553-74

## 2020-01-14 ENCOUNTER — Ambulatory Visit: Payer: Medicaid Other

## 2020-01-28 ENCOUNTER — Ambulatory Visit: Payer: Medicaid Other

## 2023-10-02 DIAGNOSIS — R519 Headache, unspecified: Secondary | ICD-10-CM | POA: Diagnosis not present

## 2023-10-02 DIAGNOSIS — Z982 Presence of cerebrospinal fluid drainage device: Secondary | ICD-10-CM | POA: Diagnosis not present

## 2023-10-02 DIAGNOSIS — I10 Essential (primary) hypertension: Secondary | ICD-10-CM | POA: Diagnosis not present

## 2023-10-02 DIAGNOSIS — M1611 Unilateral primary osteoarthritis, right hip: Secondary | ICD-10-CM | POA: Diagnosis not present

## 2023-10-02 DIAGNOSIS — M2550 Pain in unspecified joint: Secondary | ICD-10-CM | POA: Diagnosis not present

## 2023-10-02 DIAGNOSIS — J449 Chronic obstructive pulmonary disease, unspecified: Secondary | ICD-10-CM | POA: Diagnosis not present

## 2023-10-02 DIAGNOSIS — G919 Hydrocephalus, unspecified: Secondary | ICD-10-CM | POA: Diagnosis not present

## 2023-10-02 DIAGNOSIS — E1169 Type 2 diabetes mellitus with other specified complication: Secondary | ICD-10-CM | POA: Diagnosis not present

## 2023-10-02 DIAGNOSIS — M549 Dorsalgia, unspecified: Secondary | ICD-10-CM | POA: Diagnosis not present

## 2023-10-02 DIAGNOSIS — E785 Hyperlipidemia, unspecified: Secondary | ICD-10-CM | POA: Diagnosis not present

## 2023-10-02 DIAGNOSIS — E7849 Other hyperlipidemia: Secondary | ICD-10-CM | POA: Diagnosis not present

## 2023-10-02 DIAGNOSIS — N1832 Chronic kidney disease, stage 3b: Secondary | ICD-10-CM | POA: Diagnosis not present

## 2023-12-09 DIAGNOSIS — M545 Low back pain, unspecified: Secondary | ICD-10-CM | POA: Diagnosis not present

## 2023-12-09 DIAGNOSIS — G8929 Other chronic pain: Secondary | ICD-10-CM | POA: Diagnosis not present

## 2023-12-09 DIAGNOSIS — M79604 Pain in right leg: Secondary | ICD-10-CM | POA: Diagnosis not present

## 2023-12-21 DIAGNOSIS — Z743 Need for continuous supervision: Secondary | ICD-10-CM | POA: Diagnosis not present

## 2023-12-21 DIAGNOSIS — Z885 Allergy status to narcotic agent status: Secondary | ICD-10-CM | POA: Diagnosis not present

## 2023-12-21 DIAGNOSIS — R0600 Dyspnea, unspecified: Secondary | ICD-10-CM | POA: Diagnosis not present

## 2023-12-21 DIAGNOSIS — R0602 Shortness of breath: Secondary | ICD-10-CM | POA: Diagnosis not present

## 2023-12-21 DIAGNOSIS — Z888 Allergy status to other drugs, medicaments and biological substances status: Secondary | ICD-10-CM | POA: Diagnosis not present

## 2023-12-21 DIAGNOSIS — I509 Heart failure, unspecified: Secondary | ICD-10-CM | POA: Diagnosis not present

## 2023-12-21 DIAGNOSIS — J441 Chronic obstructive pulmonary disease with (acute) exacerbation: Secondary | ICD-10-CM | POA: Diagnosis not present

## 2023-12-21 DIAGNOSIS — I110 Hypertensive heart disease with heart failure: Secondary | ICD-10-CM | POA: Diagnosis not present

## 2023-12-21 DIAGNOSIS — Z79899 Other long term (current) drug therapy: Secondary | ICD-10-CM | POA: Diagnosis not present

## 2023-12-21 DIAGNOSIS — R062 Wheezing: Secondary | ICD-10-CM | POA: Diagnosis not present

## 2023-12-25 DIAGNOSIS — Z1329 Encounter for screening for other suspected endocrine disorder: Secondary | ICD-10-CM | POA: Diagnosis not present

## 2023-12-25 DIAGNOSIS — E782 Mixed hyperlipidemia: Secondary | ICD-10-CM | POA: Diagnosis not present

## 2023-12-25 DIAGNOSIS — Z1321 Encounter for screening for nutritional disorder: Secondary | ICD-10-CM | POA: Diagnosis not present

## 2023-12-25 DIAGNOSIS — N1831 Chronic kidney disease, stage 3a: Secondary | ICD-10-CM | POA: Diagnosis not present

## 2023-12-25 DIAGNOSIS — I10 Essential (primary) hypertension: Secondary | ICD-10-CM | POA: Diagnosis not present

## 2023-12-25 DIAGNOSIS — E1169 Type 2 diabetes mellitus with other specified complication: Secondary | ICD-10-CM | POA: Diagnosis not present

## 2024-01-01 DIAGNOSIS — E782 Mixed hyperlipidemia: Secondary | ICD-10-CM | POA: Diagnosis not present

## 2024-01-01 DIAGNOSIS — J441 Chronic obstructive pulmonary disease with (acute) exacerbation: Secondary | ICD-10-CM | POA: Diagnosis not present

## 2024-01-01 DIAGNOSIS — E1169 Type 2 diabetes mellitus with other specified complication: Secondary | ICD-10-CM | POA: Diagnosis not present

## 2024-01-01 DIAGNOSIS — J9611 Chronic respiratory failure with hypoxia: Secondary | ICD-10-CM | POA: Diagnosis not present

## 2024-01-14 DIAGNOSIS — J9611 Chronic respiratory failure with hypoxia: Secondary | ICD-10-CM | POA: Diagnosis not present

## 2024-01-14 DIAGNOSIS — N1831 Chronic kidney disease, stage 3a: Secondary | ICD-10-CM | POA: Diagnosis not present

## 2024-01-14 DIAGNOSIS — I10 Essential (primary) hypertension: Secondary | ICD-10-CM | POA: Diagnosis not present

## 2024-01-14 DIAGNOSIS — E782 Mixed hyperlipidemia: Secondary | ICD-10-CM | POA: Diagnosis not present

## 2024-01-25 DIAGNOSIS — J9811 Atelectasis: Secondary | ICD-10-CM | POA: Diagnosis not present

## 2024-01-25 DIAGNOSIS — R9389 Abnormal findings on diagnostic imaging of other specified body structures: Secondary | ICD-10-CM | POA: Diagnosis not present

## 2024-01-25 DIAGNOSIS — R062 Wheezing: Secondary | ICD-10-CM | POA: Diagnosis not present

## 2024-01-25 DIAGNOSIS — Z20822 Contact with and (suspected) exposure to covid-19: Secondary | ICD-10-CM | POA: Diagnosis not present

## 2024-01-25 DIAGNOSIS — J441 Chronic obstructive pulmonary disease with (acute) exacerbation: Secondary | ICD-10-CM | POA: Diagnosis not present

## 2024-01-25 DIAGNOSIS — R0602 Shortness of breath: Secondary | ICD-10-CM | POA: Diagnosis not present

## 2024-01-25 DIAGNOSIS — I509 Heart failure, unspecified: Secondary | ICD-10-CM | POA: Diagnosis not present

## 2024-01-25 DIAGNOSIS — Z743 Need for continuous supervision: Secondary | ICD-10-CM | POA: Diagnosis not present

## 2024-01-25 DIAGNOSIS — I110 Hypertensive heart disease with heart failure: Secondary | ICD-10-CM | POA: Diagnosis not present

## 2024-01-30 DIAGNOSIS — E782 Mixed hyperlipidemia: Secondary | ICD-10-CM | POA: Diagnosis not present

## 2024-01-30 DIAGNOSIS — I10 Essential (primary) hypertension: Secondary | ICD-10-CM | POA: Diagnosis not present

## 2024-01-30 DIAGNOSIS — J441 Chronic obstructive pulmonary disease with (acute) exacerbation: Secondary | ICD-10-CM | POA: Diagnosis not present

## 2024-01-30 DIAGNOSIS — J9611 Chronic respiratory failure with hypoxia: Secondary | ICD-10-CM | POA: Diagnosis not present

## 2024-02-01 DIAGNOSIS — Z66 Do not resuscitate: Secondary | ICD-10-CM | POA: Diagnosis not present

## 2024-02-01 DIAGNOSIS — Z885 Allergy status to narcotic agent status: Secondary | ICD-10-CM | POA: Diagnosis not present

## 2024-02-01 DIAGNOSIS — Z79899 Other long term (current) drug therapy: Secondary | ICD-10-CM | POA: Diagnosis not present

## 2024-02-01 DIAGNOSIS — R0602 Shortness of breath: Secondary | ICD-10-CM | POA: Diagnosis not present

## 2024-02-01 DIAGNOSIS — I509 Heart failure, unspecified: Secondary | ICD-10-CM | POA: Diagnosis not present

## 2024-02-01 DIAGNOSIS — R062 Wheezing: Secondary | ICD-10-CM | POA: Diagnosis not present

## 2024-02-01 DIAGNOSIS — I499 Cardiac arrhythmia, unspecified: Secondary | ICD-10-CM | POA: Diagnosis not present

## 2024-02-01 DIAGNOSIS — J441 Chronic obstructive pulmonary disease with (acute) exacerbation: Secondary | ICD-10-CM | POA: Diagnosis not present

## 2024-02-01 DIAGNOSIS — R9389 Abnormal findings on diagnostic imaging of other specified body structures: Secondary | ICD-10-CM | POA: Diagnosis not present

## 2024-02-01 DIAGNOSIS — I110 Hypertensive heart disease with heart failure: Secondary | ICD-10-CM | POA: Diagnosis not present

## 2024-02-01 DIAGNOSIS — Z7951 Long term (current) use of inhaled steroids: Secondary | ICD-10-CM | POA: Diagnosis not present

## 2024-02-01 DIAGNOSIS — Z888 Allergy status to other drugs, medicaments and biological substances status: Secondary | ICD-10-CM | POA: Diagnosis not present

## 2024-02-10 DIAGNOSIS — M792 Neuralgia and neuritis, unspecified: Secondary | ICD-10-CM | POA: Diagnosis not present

## 2024-02-10 DIAGNOSIS — G43009 Migraine without aura, not intractable, without status migrainosus: Secondary | ICD-10-CM | POA: Diagnosis not present

## 2024-02-10 DIAGNOSIS — M25519 Pain in unspecified shoulder: Secondary | ICD-10-CM | POA: Diagnosis not present

## 2024-02-10 DIAGNOSIS — G894 Chronic pain syndrome: Secondary | ICD-10-CM | POA: Diagnosis not present

## 2024-02-10 DIAGNOSIS — M2550 Pain in unspecified joint: Secondary | ICD-10-CM | POA: Diagnosis not present

## 2024-02-10 DIAGNOSIS — M549 Dorsalgia, unspecified: Secondary | ICD-10-CM | POA: Diagnosis not present

## 2024-02-10 DIAGNOSIS — M25559 Pain in unspecified hip: Secondary | ICD-10-CM | POA: Diagnosis not present

## 2024-02-10 DIAGNOSIS — R519 Headache, unspecified: Secondary | ICD-10-CM | POA: Diagnosis not present

## 2024-02-10 DIAGNOSIS — Z79891 Long term (current) use of opiate analgesic: Secondary | ICD-10-CM | POA: Diagnosis not present

## 2024-02-13 DIAGNOSIS — J9811 Atelectasis: Secondary | ICD-10-CM | POA: Diagnosis not present

## 2024-02-13 DIAGNOSIS — E875 Hyperkalemia: Secondary | ICD-10-CM | POA: Diagnosis not present

## 2024-02-13 DIAGNOSIS — R0689 Other abnormalities of breathing: Secondary | ICD-10-CM | POA: Diagnosis not present

## 2024-02-13 DIAGNOSIS — R0602 Shortness of breath: Secondary | ICD-10-CM | POA: Diagnosis not present

## 2024-02-13 DIAGNOSIS — I499 Cardiac arrhythmia, unspecified: Secondary | ICD-10-CM | POA: Diagnosis not present

## 2024-02-13 DIAGNOSIS — I5032 Chronic diastolic (congestive) heart failure: Secondary | ICD-10-CM | POA: Diagnosis not present

## 2024-02-13 DIAGNOSIS — D72829 Elevated white blood cell count, unspecified: Secondary | ICD-10-CM | POA: Diagnosis not present

## 2024-02-13 DIAGNOSIS — J441 Chronic obstructive pulmonary disease with (acute) exacerbation: Secondary | ICD-10-CM | POA: Diagnosis not present

## 2024-02-13 DIAGNOSIS — N179 Acute kidney failure, unspecified: Secondary | ICD-10-CM | POA: Diagnosis not present

## 2024-02-13 DIAGNOSIS — Z885 Allergy status to narcotic agent status: Secondary | ICD-10-CM | POA: Diagnosis not present

## 2024-02-13 DIAGNOSIS — Z79899 Other long term (current) drug therapy: Secondary | ICD-10-CM | POA: Diagnosis not present

## 2024-02-13 DIAGNOSIS — Z1152 Encounter for screening for COVID-19: Secondary | ICD-10-CM | POA: Diagnosis not present

## 2024-02-13 DIAGNOSIS — Z66 Do not resuscitate: Secondary | ICD-10-CM | POA: Diagnosis not present

## 2024-02-13 DIAGNOSIS — R9389 Abnormal findings on diagnostic imaging of other specified body structures: Secondary | ICD-10-CM | POA: Diagnosis not present

## 2024-02-13 DIAGNOSIS — R062 Wheezing: Secondary | ICD-10-CM | POA: Diagnosis not present

## 2024-02-13 DIAGNOSIS — I110 Hypertensive heart disease with heart failure: Secondary | ICD-10-CM | POA: Diagnosis not present

## 2024-02-13 DIAGNOSIS — R0789 Other chest pain: Secondary | ICD-10-CM | POA: Diagnosis not present

## 2024-02-13 DIAGNOSIS — R059 Cough, unspecified: Secondary | ICD-10-CM | POA: Diagnosis not present

## 2024-02-14 DIAGNOSIS — N179 Acute kidney failure, unspecified: Secondary | ICD-10-CM | POA: Diagnosis not present

## 2024-02-14 DIAGNOSIS — I5032 Chronic diastolic (congestive) heart failure: Secondary | ICD-10-CM | POA: Diagnosis not present

## 2024-02-14 DIAGNOSIS — D72829 Elevated white blood cell count, unspecified: Secondary | ICD-10-CM | POA: Diagnosis not present

## 2024-02-14 DIAGNOSIS — J441 Chronic obstructive pulmonary disease with (acute) exacerbation: Secondary | ICD-10-CM | POA: Diagnosis not present

## 2024-02-14 DIAGNOSIS — I10 Essential (primary) hypertension: Secondary | ICD-10-CM | POA: Diagnosis not present

## 2024-02-14 DIAGNOSIS — E875 Hyperkalemia: Secondary | ICD-10-CM | POA: Diagnosis not present

## 2024-02-15 DIAGNOSIS — Z79899 Other long term (current) drug therapy: Secondary | ICD-10-CM | POA: Diagnosis not present

## 2024-02-15 DIAGNOSIS — Z792 Long term (current) use of antibiotics: Secondary | ICD-10-CM | POA: Diagnosis not present

## 2024-02-15 DIAGNOSIS — I110 Hypertensive heart disease with heart failure: Secondary | ICD-10-CM | POA: Diagnosis not present

## 2024-02-15 DIAGNOSIS — I5032 Chronic diastolic (congestive) heart failure: Secondary | ICD-10-CM | POA: Diagnosis not present

## 2024-02-15 DIAGNOSIS — E875 Hyperkalemia: Secondary | ICD-10-CM | POA: Diagnosis not present

## 2024-02-15 DIAGNOSIS — N179 Acute kidney failure, unspecified: Secondary | ICD-10-CM | POA: Diagnosis not present

## 2024-02-15 DIAGNOSIS — Z7951 Long term (current) use of inhaled steroids: Secondary | ICD-10-CM | POA: Diagnosis not present

## 2024-02-15 DIAGNOSIS — J441 Chronic obstructive pulmonary disease with (acute) exacerbation: Secondary | ICD-10-CM | POA: Diagnosis not present

## 2024-02-15 DIAGNOSIS — D72829 Elevated white blood cell count, unspecified: Secondary | ICD-10-CM | POA: Diagnosis not present

## 2024-02-16 DIAGNOSIS — E875 Hyperkalemia: Secondary | ICD-10-CM | POA: Diagnosis not present

## 2024-02-16 DIAGNOSIS — I5032 Chronic diastolic (congestive) heart failure: Secondary | ICD-10-CM | POA: Diagnosis not present

## 2024-02-16 DIAGNOSIS — I110 Hypertensive heart disease with heart failure: Secondary | ICD-10-CM | POA: Diagnosis not present

## 2024-02-16 DIAGNOSIS — J441 Chronic obstructive pulmonary disease with (acute) exacerbation: Secondary | ICD-10-CM | POA: Diagnosis not present

## 2024-02-16 DIAGNOSIS — D72829 Elevated white blood cell count, unspecified: Secondary | ICD-10-CM | POA: Diagnosis not present

## 2024-02-16 DIAGNOSIS — Z79899 Other long term (current) drug therapy: Secondary | ICD-10-CM | POA: Diagnosis not present

## 2024-02-25 DIAGNOSIS — J449 Chronic obstructive pulmonary disease, unspecified: Secondary | ICD-10-CM | POA: Diagnosis not present

## 2024-02-25 DIAGNOSIS — N1831 Chronic kidney disease, stage 3a: Secondary | ICD-10-CM | POA: Diagnosis not present

## 2024-03-08 NOTE — Progress Notes (Unsigned)
 [name], male    DOB: 1956/07/29    MRN: [id number]   Brief patient profile:  7   yobm never  smoker with  referred to pulmonary clinic in [place]  03/09/2024 by Dr Herman Longs for wheezing /coughing and sometimes sob x summer 2024   Pt not previously seen by PCCM service/ R HDelevation since at least 2012 and paced on advair in his 80s ( not really sure it helped)   Last prednisone  was p ER end of May seemed to help transiently    History of Present Illness  03/09/2024  Pulmonary/ 1st office eval/ [facility] / [place] [facility] on trlegy and lisinopril   Chief Complaint  Patient presents with   Establish Care    DOE  Dyspnea:  neighborhood walker same speed as others Cough: nonproductive assoc with nasal congestion esp at hs  Sleep: bed is flat , head up on 3 pillows with cough hs x several min  SABA use: rarely needed  02: none     No obvious day to day or daytime pattern/variability or assoc excess/ purulent sputum or mucus plugs or hemoptysis or cp or chest tightness, subjective wheeze or overt  hb symptoms.    Also denies any obvious fluctuation of symptoms with weather or environmental changes or other aggravating or alleviating factors except as outlined above   No unusual exposure hx or h/o childhood pna/ asthma or knowledge of premature birth.  Current Allergies, Complete Past Medical History, Past Surgical History, Family History, and Social History were reviewed in [employer] record.  ROS  The following are not active complaints unless bolded Hoarseness, sore throat, dysphagia, dental problems, itching, sneezing,  nasal congestion and sense of discharge of excess mucus or purulent secretions, ear ache,   fever, chills, sweats, unintended wt loss or wt gain, classically pleuritic or exertional cp,  orthopnea pnd or arm/hand swelling  or leg swelling, presyncope, palpitations, abdominal pain, anorexia, nausea, vomiting, diarrhea  or change in bowel  habits or change in bladder habits, change in stools or change in urine, dysuria, hematuria,  rash, arthralgias, visual complaints, headache, numbness, weakness or ataxia or problems with walking or coordination,  change in mood or  memory.            Outpatient Medications Prior to Visit  Medication Sig Dispense Refill   albuterol  (PROVENTIL ) (2.5 MG/3ML) 0.083% nebulizer solution Take 3 mLs (2.5 mg total) by nebulization 3 (three) times daily. 75 mL 12   amLODipine  (NORVASC ) 10 MG tablet Take 10 mg by mouth daily.     clonazePAM (KLONOPIN) 0.5 MG tablet Take 0.5 mg by mouth daily.     fluticasone -salmeterol (ADVAIR DISKUS) 250-50 MCG/ACT AEPB Inhale 1 puff into the lungs in the morning and at bedtime.     lisinopril  (ZESTRIL ) 40 MG tablet Take 40 mg by mouth daily.     predniSONE  (DELTASONE ) 20 MG tablet 3 tabs po day one, then 2 po daily x 4 days 11 tablet 0   spironolactone (ALDACTONE) 25 MG tablet Take 25 mg by mouth.     TRELEGY ELLIPTA 100-62.5-25 MCG/ACT AEPB Inhale 1 puff into the lungs daily.     No facility-administered medications prior to visit.    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      Objective:     BP (!) 146/87   Pulse 67   Ht 5' 8 (1.727 m)   Wt 226 lb 3.2 oz (102.6 kg)   SpO2 94%   BMI 34.39 kg/m   SpO2: 94 % pleasant  amb bm sniffiling / no teech mostly pseudowheez    HEENT : Oropharynx  clear/ edentulous      Nasal turbinates mild mucopurulent secretions bilaterally    NECK :  without  apparent JVD/ palpable Nodes/TM / prominent pseudowheeze   LUNGS: no acc muscle use,  Nl contour chest which is clear to A and P (except for  decreased BS/ dullness R base)  bilaterally without cough on insp or exp maneuvers   CV:  RRR  no s3 or murmur or increase in P2, and no edema   ABD:  soft and nontender   MS:  Gait nl   ext warm without deformities Or obvious joint restrictions  calf tenderness, cyanosis or clubbing    SKIN: warm and dry without lesions    NEURO:  alert, approp, nl sensorium with  no motor or cerebellar deficits apparent.     Cxr report from 02/13/24  Negative for focal infiltrate. Mild infrahilar atelectasis bilaterally. Stable elevation right diaphragm (since at least  2012)       Assessment   Upper airway cough syndrome Onset ? 2024 while on acei an advair  then Trelegy > d/c 03/09/2024 trial basis  - stop acei 03/09/2024 and rx empirically for rhinitis/sinustis with omnicef and pred x 8 d - Allergy screen 03/09/2024 >  Eos 0. /  IgE  pending   Upper airway cough syndrome (previously labeled PNDS),  is so named because it's frequently impossible to sort out how much is  CR/sinusitis with freq throat clearing (which can be related to primary GERD)   vs  causing  secondary ( extra esophageal)  GERD from wide swings in gastric pressure that occur with throat clearing, often  promoting self use of mint and menthol lozenges that reduce the lower esophageal sphincter tone and exacerbate the problem further in a cyclical fashion.   These are the same pts (now being labeled as having irritable larynx syndrome by some cough centers) who not infrequently have a history of having failed to tolerate ace (likely here) inhibitors,  dry powder inhalers(possible here)  or biphosphonates or report having atypical/extraesophageal reflux symptoms that don't respond to standard doses of PPI  and are easily confused as having aecopd or asthma flares by even experienced allergists/ pulmonologists (myself included).   Will try rx as above and use duoneb as the backup in case he really does have AB with instructions to  start back on trelegy if this occurs - see avs for instructions unique to this ov    Return in 4 weeks to regroup with all meds in hand using a trust but verify approach to confirm accurate Medication  Reconciliation The principal here is that until we are certain that the  patients are doing what we've asked, it makes no sense to ask them to do more.   Essential hypertension D/c acei  03/09/2024  due to chronic cough/ pseudowheeze   In the best review of chronic cough to date ( NEJM 2016 375 1544-1551) ,  ACEi are now felt to cause cough in up to  20% of pts which is a 4 fold increase from previous reports and does not include the variety of non-specific complaints we see in pulmonary clinic in pts on ACEi but previously attributed to another dx like  Copd/asthma and  include PNDS, throat  congestion, bronchitis, unexplained dyspnea and noct strangling sensations, and hoarseness, but also  atypical /refractory GERD symptoms like dysphagia and bad heartburn   The only way I know  to prove this is not an ACEi Case is a trial off ACEi x a minimum of 4-6 weeks then regroup.      Each maintenance medication was reviewed in detail including emphasizing most importantly the difference between maintenance and prns and under what circumstances the prns are to be triggered using an action plan format where appropriate.  Total time for H and P, chart review, counseling, reviewing dpi/ neb  device(s) and generating customized AVS unique to this office visit / same day charting = 60 min        Vernestine Gondola, MD 03/09/2024

## 2024-03-09 ENCOUNTER — Encounter: Payer: Self-pay | Admitting: Internal Medicine

## 2024-03-09 ENCOUNTER — Ambulatory Visit (INDEPENDENT_AMBULATORY_CARE_PROVIDER_SITE_OTHER): Admitting: Internal Medicine

## 2024-03-09 VITALS — BP 146/87 | HR 67 | Ht 68.0 in | Wt 226.2 lb

## 2024-03-09 DIAGNOSIS — I10 Essential (primary) hypertension: Secondary | ICD-10-CM | POA: Diagnosis not present

## 2024-03-09 DIAGNOSIS — R058 Other specified cough: Secondary | ICD-10-CM | POA: Insufficient documentation

## 2024-03-09 MED ORDER — PREDNISONE 10 MG PO TABS
ORAL_TABLET | ORAL | 20 refills | Status: DC
Start: 2024-03-09 — End: 2024-04-08

## 2024-03-09 MED ORDER — OLMESARTAN MEDOXOMIL 40 MG PO TABS: 40.0000 mg | ORAL_TABLET | Freq: Every day | ORAL | 11 refills | Status: AC

## 2024-03-09 MED ORDER — CEFDINIR 300 MG PO CAPS: 300.0000 mg | ORAL_CAPSULE | Freq: Two times a day (BID) | ORAL | 0 refills | Status: DC

## 2024-03-09 NOTE — Assessment & Plan Note (Addendum)
 Onset ? 2024 while on acei an advair  then Trelegy > d/c 03/09/2024 trial basis  - stop acei 03/09/2024 and rx empirically for rhinitis/sinustis with omnicef and pred x 8 d - Allergy screen 03/09/2024 >  Eos 0. /  IgE  pending   Upper airway cough syndrome (previously labeled PNDS),  is so named because it's frequently impossible to sort out how much is  CR/sinusitis with freq throat clearing (which can be related to primary GERD)   vs  causing  secondary ( extra esophageal)  GERD from wide swings in gastric pressure that occur with throat clearing, often  promoting self use of mint and menthol lozenges that reduce the lower esophageal sphincter tone and exacerbate the problem further in a cyclical fashion.   These are the same pts (now being labeled as having irritable larynx syndrome by some cough centers) who not infrequently have a history of having failed to tolerate ace (likely here) inhibitors,  dry powder inhalers(possible here)  or biphosphonates or report having atypical/extraesophageal reflux symptoms that don't respond to standard doses of PPI  and are easily confused as having aecopd or asthma flares by even experienced allergists/ pulmonologists (myself included).   Will try rx as above and use duoneb as the backup in case he really does have AB with instructions to start back on trelegy if this occurs - see avs for instructions unique to this ov    Return in 4 weeks to regroup with all meds in hand using a trust but verify approach to confirm accurate Medication  Reconciliation The principal here is that until we are certain that the  patients are doing what we've asked, it makes no sense to ask them to do more.

## 2024-03-09 NOTE — Assessment & Plan Note (Addendum)
 D/c acei  03/09/2024  due to chronic cough/ pseudowheeze   In the best review of chronic cough to date ( NEJM 2016 375 [id number]) ,  ACEi are now felt to cause cough in up to  20% of pts which is a 4 fold increase from previous reports and does not include the variety of non-specific complaints we see in pulmonary clinic in pts on ACEi but previously attributed to another dx like  Copd/asthma and  include PNDS, throat  congestion, bronchitis, unexplained dyspnea and noct strangling sensations, and hoarseness, but also  atypical /refractory GERD symptoms like dysphagia and bad heartburn   The only way I know  to prove this is not an ACEi Case is a trial off ACEi x a minimum of 4-6 weeks then regroup.      Each maintenance medication was reviewed in detail including emphasizing most importantly the difference between maintenance and prns and under what circumstances the prns are to be triggered using an action plan format where appropriate.  Total time for H and P, chart review, counseling, reviewing dpi/ neb  device(s) and generating customized AVS unique to this office visit / same day charting = 60 min with pt new to me

## 2024-03-09 NOTE — Patient Instructions (Addendum)
 Try off trelegy and lisinopril    Start olmesartan 40 mg one daily in place of lisinopril     If breathing worsens and you need more nebulizer after you finish prednisone  start back on  trelegy   Prednisone  10 mg take  4 each am x 2 days,  2 x 3 days,  2 each am x 2 days,  1 each am x 2 days and stop   Omnicef 300 mg twice daily x 10 days    Please schedule a follow up office visit in 4 weeks, sooner if needed  with all medications /inhalers/ solutions in hand so we can verify exactly what you are taking. This includes all medications from all doctors and over the counters

## 2024-03-10 DIAGNOSIS — M549 Dorsalgia, unspecified: Secondary | ICD-10-CM | POA: Diagnosis not present

## 2024-03-10 DIAGNOSIS — G43009 Migraine without aura, not intractable, without status migrainosus: Secondary | ICD-10-CM | POA: Diagnosis not present

## 2024-03-10 DIAGNOSIS — M2550 Pain in unspecified joint: Secondary | ICD-10-CM | POA: Diagnosis not present

## 2024-03-10 DIAGNOSIS — R519 Headache, unspecified: Secondary | ICD-10-CM | POA: Diagnosis not present

## 2024-03-10 DIAGNOSIS — G894 Chronic pain syndrome: Secondary | ICD-10-CM | POA: Diagnosis not present

## 2024-03-10 DIAGNOSIS — M25519 Pain in unspecified shoulder: Secondary | ICD-10-CM | POA: Diagnosis not present

## 2024-03-10 DIAGNOSIS — M792 Neuralgia and neuritis, unspecified: Secondary | ICD-10-CM | POA: Diagnosis not present

## 2024-03-10 DIAGNOSIS — M25559 Pain in unspecified hip: Secondary | ICD-10-CM | POA: Diagnosis not present

## 2024-03-10 LAB — BASIC METABOLIC PANEL WITH GFR
BUN/Creatinine Ratio: 11 (ref 10–24)
BUN: 14 mg/dL (ref 8–27)
CO2: 24 mmol/L (ref 20–29)
Calcium: 9.6 mg/dL (ref 8.6–10.2)
Chloride: 103 mmol/L (ref 96–106)
Creatinine, Ser: 1.27 mg/dL (ref 0.76–1.27)
Glucose: 89 mg/dL (ref 70–99)
Potassium: 4.5 mmol/L (ref 3.5–5.2)
Sodium: 142 mmol/L (ref 134–144)
eGFR: 62 mL/min/{1.73_m2} (ref >59)

## 2024-03-11 ENCOUNTER — Other Ambulatory Visit: Payer: Self-pay | Admitting: *Deleted

## 2024-03-11 ENCOUNTER — Encounter: Payer: Self-pay | Admitting: Gastroenterology

## 2024-03-11 ENCOUNTER — Ambulatory Visit (INDEPENDENT_AMBULATORY_CARE_PROVIDER_SITE_OTHER): Admitting: Gastroenterology

## 2024-03-11 ENCOUNTER — Telehealth: Payer: Self-pay | Admitting: *Deleted

## 2024-03-11 ENCOUNTER — Encounter: Payer: Self-pay | Admitting: *Deleted

## 2024-03-11 VITALS — BP 143/83 | HR 66 | Temp 98.0°F | Ht 68.0 in | Wt 230.2 lb

## 2024-03-11 DIAGNOSIS — R1319 Other dysphagia: Secondary | ICD-10-CM

## 2024-03-11 DIAGNOSIS — R9389 Abnormal findings on diagnostic imaging of other specified body structures: Secondary | ICD-10-CM

## 2024-03-11 DIAGNOSIS — R1310 Dysphagia, unspecified: Secondary | ICD-10-CM

## 2024-03-11 DIAGNOSIS — Z1211 Encounter for screening for malignant neoplasm of colon: Secondary | ICD-10-CM | POA: Insufficient documentation

## 2024-03-11 NOTE — Patient Instructions (Signed)
 Upper endoscopy in near future.   Until you have your procedure, avoid dry tough meats, raw vegetables. Chew your food thoroughly.   For colon cancer screening: You declined screening today. You may have had screening in the last couple of years per your recall. I would recommend considering colon cancer screening via Cologuard within the next year (this is stool collection test) OR consider colonoscopy in the next 1-2 years since we are unable to obtain copy of last procedures.

## 2024-03-11 NOTE — Telephone Encounter (Signed)
 Pt states he will go to his PCP to have lab work done prior to his procedure. He will have them fax results.

## 2024-03-11 NOTE — Progress Notes (Unsigned)
 GI Office Note    Referring Provider: Alston Jerry, MD Primary Care Physician:  Alston Jerry, MD  Primary Gastroenterologist: Rheba Cedar, MD   Chief Complaint   Chief Complaint  Patient presents with   Dysphagia    Foods are getting hung up in his throat     History of Present Illness   [name] is a 68 y.o. male presenting today at the request of Dr. Herman Longs for dysphagia and uncertain colon cancer screening history.   Recent admission last month with COPD exacerbation, leukocytosis.  Labs planned by pulmonology in the near future.  Seen by Dr. Waymond Hailey yesterday with diagnosis of upper airway cough syndrome.  Recent chest x-ray during admission with chronic stable right diaphragm elevation but no mention of COPD or infiltrates.  Further review of previous CT chest imaging from 2017 revealed ascending thoracic aortic prominence.  Measured ascending thoracic aortic diameter 4.2 x 4.1 cm recommending annual imaging by CTA or MRA.  Echocardiogram May 2023: Summary   1. The left ventricle is normal in size with mildly to moderately increased  wall thickness.    2. The left ventricular systolic function is normal, LVEF is visually  estimated at > 55%.    3. There is grade I diastolic dysfunction (impaired relaxation).    4. 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    5. The right ventricle is upper normal in size, with low normal systolic  function.   Today:  Solid and liquid dysphagia for few months. No heartburn. No N/V.  Some vague abdominal discomfort, patient questions if related to his shunt. BM regular. No melena, brbpr.  Patient reports previous colonoscopy while incarcerated in [place], [place]. He believes it was within the past 2 years and was negative. He is not interested in colon cancer screening today. We discussed both options of Cologuard and colonoscopy.  01/2024: Na 140, Cre 1.76, white blood cell count 25,800, hemoglobin 11.1,  hematocrit 34.9, MCV 97.8, platelets 223,000  12/2023: WBC 18.7, Hgb 12.2, MCV 94.7, Plt 267, na 140, cre1.54, Tbili 0.8, AP 70, AST 6, ALT 5, TSH 0.575, Vit B12 384  Medications   Current Outpatient Medications  Medication Sig Dispense Refill   albuterol  (PROVENTIL ) (2.5 MG/3ML) 0.083% nebulizer solution Take 3 mLs (2.5 mg total) by nebulization 3 (three) times daily. 75 mL 12   albuterol  (VENTOLIN  HFA) 108 (90 Base) MCG/ACT inhaler Inhale 1 puff into the lungs every 4 (four) hours as needed.     amLODipine  (NORVASC ) 10 MG tablet Take 10 mg by mouth daily.     cefdinir (OMNICEF) 300 MG capsule Take 1 capsule (300 mg total) by mouth 2 (two) times daily. 20 capsule 0   DULoxetine (CYMBALTA) 20 MG capsule Take 20 mg by mouth daily.     olmesartan (BENICAR) 40 MG tablet Take 1 tablet (40 mg total) by mouth daily. 30 tablet 11   oxyCODONE  (OXY IR/ROXICODONE ) 5 MG immediate release tablet Take 5 mg by mouth 4 (four) times daily as needed.     predniSONE  (DELTASONE ) 10 MG tablet Take 4 for two days three for two days two for two days one for two days 14 tablet 20   spironolactone (ALDACTONE) 25 MG tablet Take 25 mg by mouth.     No current facility-administered medications for this visit.    Allergies   Allergies as of 03/11/2024 - Review Complete 03/11/2024  Allergen Reaction Noted   Venlafaxine Other (See Comments)  08/07/2018   Morphine Itching     Past Medical History   Past Medical History:  Diagnosis Date   (HFpEF) heart failure with preserved ejection fraction (HCC)    Abdominal hernia    Anxiety    Asthma    BPH (benign prostatic hyperplasia)    Chronic pain    CKD (chronic kidney disease)    COPD (chronic obstructive pulmonary disease) (HCC)    Diabetes mellitus    Diastolic CHF (HCC)    Headache(784.0)    chronic, daily   History of incarceration    released 02/2023   Hydrocephalus (HCC)    s/p VP shunt in 2005   Hyperlipidemia    Hypertension    Substance abuse  (HCC)    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 Cocaine    Past Surgical History   Past Surgical History:  Procedure Laterality Date   BRAIN SURGERY     COLON SURGERY  06/2011   Diverticulitis Complicated   MANDIBLE SURGERY     VENTRICULOPERITONEAL SHUNT      Past Family History   Family History  Problem Relation Age of Onset   Hypertension Mother    Hyperlipidemia Mother    Depression Mother    Hypertension Father    Hyperlipidemia Father    Diabetes Father    Cancer - Lung Father    Arthritis Other    Asthma Other    Colon cancer Neg Hx     Past Social History   Social History   Socioeconomic History   Marital status: Legally Separated    Spouse name: Not on file   Number of children: Not on file   Years of education: 12   Highest education level: Not on file  Occupational History   Not on file  Tobacco Use   Smoking status: Never   Smokeless tobacco: Never  Substance and Sexual Activity   Alcohol use: No   Drug use: No    Types: Crack cocaine    Comment: Quit 1999   Sexual activity: Yes  Other Topics Concern   Not on file  Social History Narrative   Not on file   Social Drivers of Health   Financial Resource Strain: Not on file  Food Insecurity: Not on file  Transportation Needs: Not on file  Physical Activity: Not on file  Stress: Not on file  Social Connections: Not on file  Intimate Partner Violence: Not on file    Review of Systems   General: Negative for anorexia, weight loss, fever, chills, fatigue, weakness. Eyes: Negative for vision changes.  ENT: Negative for hoarseness, difficulty swallowing , nasal congestion. CV: Negative for chest pain, angina, palpitations, dyspnea on exertion, peripheral edema.  Respiratory: Negative for dyspnea at rest, dyspnea on exertion, cough, sputum, wheezing.  GI: See history of present illness. GU:  Negative for dysuria, hematuria, urinary incontinence, urinary frequency, nocturnal urination.  MS: Negative for  joint pain, low back pain.  Derm: Negative for rash or itching.  Neuro: Negative for weakness, abnormal sensation, seizure, frequent headaches, memory loss,  confusion.  Psych: Negative for anxiety, depression, suicidal ideation, hallucinations.  Endo: Negative for unusual weight change.  Heme: Negative for bruising or bleeding. Allergy: Negative for rash or hives.  Physical Exam   BP (!) 143/83 (BP Location: Right Arm, Patient Position: Sitting, Cuff Size: Large)   Pulse 66   Temp 98 F (36.7 C) (Oral)   Ht 5' 8 (1.727 m)   Wt 230 lb 3.2 oz (  104.4 kg)   SpO2 92%   BMI 35.00 kg/m    General: Well-nourished, well-developed in no acute distress.  Head: Normocephalic, atraumatic.   Eyes: Conjunctiva pink, no icterus. Mouth: Oropharyngeal mucosa moist and pink  Neck: Supple without thyromegaly, masses, or lymphadenopathy.  Lungs: Clear to auscultation bilaterally.  Heart: Regular rate and rhythm, no murmurs rubs or gallops.  Abdomen: Bowel sounds are normal, nontender, nondistended, no hepatosplenomegaly or masses,  no abdominal bruits  , no rebound or guarding.  Diastasis recti Rectal: not performed Extremities: No lower extremity edema. No clubbing or deformities.  Neuro: Alert and oriented x 4 , grossly normal neurologically.  Skin: Warm and dry, no rash or jaundice.   Psych: Alert and cooperative, normal mood and affect.  Labs   See hpi  Imaging Studies   No results found.  Assessment/Plan:   Esophageal dysphagia: -recommend EGD/ED with Dr. Riley Cheadle. ASA 3. Rm 1,2 ok.  I have discussed the risks, alternatives, benefits with regards to but not limited to the risk of reaction to medication, bleeding, infection, perforation and the patient is agreeable to proceed. Written consent to be obtained. -BMET -soft diet  Colon cancer screening: -patient declined  Ascending thoracic aortic prominence: -noted on chest CT in 2017 -patient is unaware if this has been followed  bu in 2017, CTA or MRA yearly advised.  -we will arrange for CTA chest.   Trudie Fuse. Harles Lied, MHS, PA-C [facility]

## 2024-03-11 NOTE — H&P (View-Only) (Signed)
 GI Office Note    Referring Provider: Alston Jerry, MD Primary Care Physician:  Alston Jerry, MD  Primary Gastroenterologist: Rheba Cedar, MD   Chief Complaint   Chief Complaint  Patient presents with   Dysphagia    Foods are getting hung up in his throat     History of Present Illness   [name] is a 68 y.o. male presenting today at the request of Dr. Herman Longs for dysphagia and uncertain colon cancer screening history.   Recent admission last month with COPD exacerbation, leukocytosis.  Labs planned by pulmonology in the near future.  Seen by Dr. Waymond Hailey yesterday with diagnosis of upper airway cough syndrome.  Recent chest x-ray during admission with chronic stable right diaphragm elevation but no mention of COPD or infiltrates.  Further review of previous CT chest imaging from 2017 revealed ascending thoracic aortic prominence.  Measured ascending thoracic aortic diameter 4.2 x 4.1 cm recommending annual imaging by CTA or MRA.  Echocardiogram May 2023: Summary   1. The left ventricle is normal in size with mildly to moderately increased  wall thickness.    2. The left ventricular systolic function is normal, LVEF is visually  estimated at > 55%.    3. There is grade I diastolic dysfunction (impaired relaxation).    4. 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    5. The right ventricle is upper normal in size, with low normal systolic  function.   Today:  Solid and liquid dysphagia for few months. No heartburn. No N/V.  Some vague abdominal discomfort, patient questions if related to his shunt. BM regular. No melena, brbpr.  Patient reports previous colonoscopy while incarcerated in [place], [place]. He believes it was within the past 2 years and was negative. He is not interested in colon cancer screening today. We discussed both options of Cologuard and colonoscopy.  01/2024: Na 140, Cre 1.76, white blood cell count 25,800, hemoglobin 11.1,  hematocrit 34.9, MCV 97.8, platelets 223,000  12/2023: WBC 18.7, Hgb 12.2, MCV 94.7, Plt 267, na 140, cre1.54, Tbili 0.8, AP 70, AST 6, ALT 5, TSH 0.575, Vit B12 384  Medications   Current Outpatient Medications  Medication Sig Dispense Refill   albuterol  (PROVENTIL ) (2.5 MG/3ML) 0.083% nebulizer solution Take 3 mLs (2.5 mg total) by nebulization 3 (three) times daily. 75 mL 12   albuterol  (VENTOLIN  HFA) 108 (90 Base) MCG/ACT inhaler Inhale 1 puff into the lungs every 4 (four) hours as needed.     amLODipine  (NORVASC ) 10 MG tablet Take 10 mg by mouth daily.     cefdinir (OMNICEF) 300 MG capsule Take 1 capsule (300 mg total) by mouth 2 (two) times daily. 20 capsule 0   DULoxetine (CYMBALTA) 20 MG capsule Take 20 mg by mouth daily.     olmesartan (BENICAR) 40 MG tablet Take 1 tablet (40 mg total) by mouth daily. 30 tablet 11   oxyCODONE  (OXY IR/ROXICODONE ) 5 MG immediate release tablet Take 5 mg by mouth 4 (four) times daily as needed.     predniSONE  (DELTASONE ) 10 MG tablet Take 4 for two days three for two days two for two days one for two days 14 tablet 20   spironolactone (ALDACTONE) 25 MG tablet Take 25 mg by mouth.     No current facility-administered medications for this visit.    Allergies   Allergies as of 03/11/2024 - Review Complete 03/11/2024  Allergen Reaction Noted   Venlafaxine Other (See Comments)  08/07/2018   Morphine Itching     Past Medical History   Past Medical History:  Diagnosis Date   (HFpEF) heart failure with preserved ejection fraction (HCC)    Abdominal hernia    Anxiety    Asthma    BPH (benign prostatic hyperplasia)    Chronic pain    CKD (chronic kidney disease)    COPD (chronic obstructive pulmonary disease) (HCC)    Diabetes mellitus    Diastolic CHF (HCC)    Headache(784.0)    chronic, daily   History of incarceration    released 02/2023   Hydrocephalus (HCC)    s/p VP shunt in 2005   Hyperlipidemia    Hypertension    Substance abuse  (HCC)    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 Cocaine    Past Surgical History   Past Surgical History:  Procedure Laterality Date   BRAIN SURGERY     COLON SURGERY  06/2011   Diverticulitis Complicated   MANDIBLE SURGERY     VENTRICULOPERITONEAL SHUNT      Past Family History   Family History  Problem Relation Age of Onset   Hypertension Mother    Hyperlipidemia Mother    Depression Mother    Hypertension Father    Hyperlipidemia Father    Diabetes Father    Cancer - Lung Father    Arthritis Other    Asthma Other    Colon cancer Neg Hx     Past Social History   Social History   Socioeconomic History   Marital status: Legally Separated    Spouse name: Not on file   Number of children: Not on file   Years of education: 12   Highest education level: Not on file  Occupational History   Not on file  Tobacco Use   Smoking status: Never   Smokeless tobacco: Never  Substance and Sexual Activity   Alcohol use: No   Drug use: No    Types: Crack cocaine    Comment: Quit 1999   Sexual activity: Yes  Other Topics Concern   Not on file  Social History Narrative   Not on file   Social Drivers of Health   Financial Resource Strain: Not on file  Food Insecurity: Not on file  Transportation Needs: Not on file  Physical Activity: Not on file  Stress: Not on file  Social Connections: Not on file  Intimate Partner Violence: Not on file    Review of Systems   General: Negative for anorexia, weight loss, fever, chills, fatigue, weakness. Eyes: Negative for vision changes.  ENT: Negative for hoarseness, difficulty swallowing , nasal congestion. CV: Negative for chest pain, angina, palpitations, dyspnea on exertion, peripheral edema.  Respiratory: Negative for dyspnea at rest, dyspnea on exertion, cough, sputum, wheezing.  GI: See history of present illness. GU:  Negative for dysuria, hematuria, urinary incontinence, urinary frequency, nocturnal urination.  MS: Negative for  joint pain, low back pain.  Derm: Negative for rash or itching.  Neuro: Negative for weakness, abnormal sensation, seizure, frequent headaches, memory loss,  confusion.  Psych: Negative for anxiety, depression, suicidal ideation, hallucinations.  Endo: Negative for unusual weight change.  Heme: Negative for bruising or bleeding. Allergy: Negative for rash or hives.  Physical Exam   BP (!) 143/83 (BP Location: Right Arm, Patient Position: Sitting, Cuff Size: Large)   Pulse 66   Temp 98 F (36.7 C) (Oral)   Ht 5' 8 (1.727 m)   Wt 230 lb 3.2 oz (  104.4 kg)   SpO2 92%   BMI 35.00 kg/m    General: Well-nourished, well-developed in no acute distress.  Head: Normocephalic, atraumatic.   Eyes: Conjunctiva pink, no icterus. Mouth: Oropharyngeal mucosa moist and pink  Neck: Supple without thyromegaly, masses, or lymphadenopathy.  Lungs: Clear to auscultation bilaterally.  Heart: Regular rate and rhythm, no murmurs rubs or gallops.  Abdomen: Bowel sounds are normal, nontender, nondistended, no hepatosplenomegaly or masses,  no abdominal bruits  , no rebound or guarding.  Diastasis recti Rectal: not performed Extremities: No lower extremity edema. No clubbing or deformities.  Neuro: Alert and oriented x 4 , grossly normal neurologically.  Skin: Warm and dry, no rash or jaundice.   Psych: Alert and cooperative, normal mood and affect.  Labs   See hpi  Imaging Studies   No results found.  Assessment/Plan:   Esophageal dysphagia: -recommend EGD/ED with Dr. Riley Cheadle. ASA 3. Rm 1,2 ok.  I have discussed the risks, alternatives, benefits with regards to but not limited to the risk of reaction to medication, bleeding, infection, perforation and the patient is agreeable to proceed. Written consent to be obtained. -BMET -soft diet  Colon cancer screening: -patient declined  Ascending thoracic aortic prominence: -noted on chest CT in 2017 -patient is unaware if this has been followed  bu in 2017, CTA or MRA yearly advised.  -we will arrange for CTA chest.   Trudie Fuse. Harles Lied, MHS, PA-C [facility]

## 2024-03-12 ENCOUNTER — Ambulatory Visit: Payer: Self-pay | Admitting: Internal Medicine

## 2024-03-12 LAB — CBC WITH DIFFERENTIAL/PLATELET
Basophils Absolute: 0 10*3/uL (ref 0.0–0.2)
Basos: 0 %
EOS (ABSOLUTE): 0.1 10*3/uL (ref 0.0–0.4)
Eos: 1 %
Hematocrit: 38.7 % (ref 37.5–51.0)
Hemoglobin: 12 g/dL — ABNORMAL LOW (ref 13.0–17.7)
Immature Grans (Abs): 0 10*3/uL (ref 0.0–0.1)
Immature Granulocytes: 0 %
Lymphocytes Absolute: 2 10*3/uL (ref 0.7–3.1)
Lymphs: 29 %
MCH: 30.5 pg (ref 26.6–33.0)
MCHC: 31 g/dL — ABNORMAL LOW (ref 31.5–35.7)
MCV: 98 fL — ABNORMAL HIGH (ref 79–97)
Monocytes Absolute: 0.5 10*3/uL (ref 0.1–0.9)
Monocytes: 7 %
Neutrophils Absolute: 4.3 10*3/uL (ref 1.4–7.0)
Neutrophils: 63 %
Platelets: 294 10*3/uL (ref 150–450)
RBC: 3.94 x10E6/uL — ABNORMAL LOW (ref 4.14–5.80)
RDW: 13 % (ref 11.6–15.4)
WBC: 7 10*3/uL (ref 3.4–10.8)

## 2024-03-12 LAB — IGE: IgE (Immunoglobulin E), Serum: 369 [IU]/mL (ref 6–495)

## 2024-03-13 ENCOUNTER — Telehealth: Payer: Self-pay | Admitting: Gastroenterology

## 2024-03-13 DIAGNOSIS — R9389 Abnormal findings on diagnostic imaging of other specified body structures: Secondary | ICD-10-CM

## 2024-03-13 NOTE — Telephone Encounter (Signed)
 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 He should have had yearly follow up of this but I don't see any.   Please arrange for CTA chest to evaluate Ascending thoracic aortic prominence seen on Chest CT 2017   Would like to have this done before EGD.

## 2024-03-16 NOTE — Telephone Encounter (Signed)
 No ans, vm not set up.

## 2024-03-16 NOTE — Progress Notes (Signed)
 Spoke with pt regarding test results he verbalized his understanding and pt wants to wait til next OV to discuss further bc he really likes dr wert and trust him and wants to speak about it in person. Reminded him of his next OV

## 2024-03-17 ENCOUNTER — Other Ambulatory Visit: Payer: Self-pay | Admitting: *Deleted

## 2024-03-17 DIAGNOSIS — E782 Mixed hyperlipidemia: Secondary | ICD-10-CM | POA: Diagnosis not present

## 2024-03-17 DIAGNOSIS — J449 Chronic obstructive pulmonary disease, unspecified: Secondary | ICD-10-CM | POA: Diagnosis not present

## 2024-03-17 DIAGNOSIS — J9611 Chronic respiratory failure with hypoxia: Secondary | ICD-10-CM | POA: Diagnosis not present

## 2024-03-17 DIAGNOSIS — N1831 Chronic kidney disease, stage 3a: Secondary | ICD-10-CM | POA: Diagnosis not present

## 2024-03-17 NOTE — Telephone Encounter (Signed)
 Pt was made aware and verbalized understanding. Pt is ready to move forward with scheduling CTA.

## 2024-03-17 NOTE — Addendum Note (Signed)
 Addended by: JEANELL GRAEME RAMAN on: 03/17/2024 11:31 AM   Modules accepted: Orders

## 2024-03-17 NOTE — Telephone Encounter (Signed)
 Called pt and he is aware of his CTA appt 7/9, arrival 4:30pm

## 2024-03-25 ENCOUNTER — Ambulatory Visit: Payer: Self-pay | Admitting: Internal Medicine

## 2024-03-25 ENCOUNTER — Other Ambulatory Visit (HOSPITAL_COMMUNITY)
Admission: RE | Admit: 2024-03-25 | Discharge: 2024-03-25 | Disposition: A | Discharge status: Home / Self Care | Source: Ambulatory Visit | Attending: Internal Medicine | Admitting: Internal Medicine

## 2024-03-25 DIAGNOSIS — R1319 Other dysphagia: Secondary | ICD-10-CM | POA: Insufficient documentation

## 2024-03-25 LAB — BASIC METABOLIC PANEL WITH GFR
Anion gap: 11 (ref 5–15)
BUN: 20 mg/dL (ref 8–23)
CO2: 25 mmol/L (ref 22–32)
Calcium: 9.6 mg/dL (ref 8.9–10.3)
Chloride: 101 mmol/L (ref 98–111)
Creatinine, Ser: 1.59 mg/dL — ABNORMAL HIGH (ref 0.61–1.24)
GFR, Estimated: 47 mL/min — ABNORMAL LOW (ref >60)
Glucose, Bld: 109 mg/dL — ABNORMAL HIGH (ref 70–99)
Potassium: 4.4 mmol/L (ref 3.5–5.1)
Sodium: 137 mmol/L (ref 135–145)

## 2024-04-01 ENCOUNTER — Ambulatory Visit (HOSPITAL_COMMUNITY)
Admission: RE | Admit: 2024-04-01 | Discharge: 2024-04-01 | Disposition: A | Discharge status: Home / Self Care | Source: Ambulatory Visit | Attending: Gastroenterology | Admitting: Gastroenterology

## 2024-04-01 DIAGNOSIS — R9389 Abnormal findings on diagnostic imaging of other specified body structures: Secondary | ICD-10-CM | POA: Insufficient documentation

## 2024-04-01 DIAGNOSIS — N281 Cyst of kidney, acquired: Secondary | ICD-10-CM | POA: Diagnosis not present

## 2024-04-01 MED ORDER — IOHEXOL 350 MG/ML SOLN
60.0000 mL | Freq: Once | INTRAVENOUS | Status: AC | PRN
  Administered 2024-04-01: 60 mL via INTRAVENOUS

## 2024-04-07 DIAGNOSIS — R519 Headache, unspecified: Secondary | ICD-10-CM | POA: Diagnosis not present

## 2024-04-07 DIAGNOSIS — G894 Chronic pain syndrome: Secondary | ICD-10-CM | POA: Diagnosis not present

## 2024-04-07 DIAGNOSIS — M792 Neuralgia and neuritis, unspecified: Secondary | ICD-10-CM | POA: Diagnosis not present

## 2024-04-07 DIAGNOSIS — Z79891 Long term (current) use of opiate analgesic: Secondary | ICD-10-CM | POA: Diagnosis not present

## 2024-04-07 DIAGNOSIS — G43009 Migraine without aura, not intractable, without status migrainosus: Secondary | ICD-10-CM | POA: Diagnosis not present

## 2024-04-07 DIAGNOSIS — M25519 Pain in unspecified shoulder: Secondary | ICD-10-CM | POA: Diagnosis not present

## 2024-04-07 DIAGNOSIS — M25559 Pain in unspecified hip: Secondary | ICD-10-CM | POA: Diagnosis not present

## 2024-04-07 DIAGNOSIS — M549 Dorsalgia, unspecified: Secondary | ICD-10-CM | POA: Diagnosis not present

## 2024-04-07 DIAGNOSIS — M2550 Pain in unspecified joint: Secondary | ICD-10-CM | POA: Diagnosis not present

## 2024-04-08 ENCOUNTER — Ambulatory Visit (INDEPENDENT_AMBULATORY_CARE_PROVIDER_SITE_OTHER): Admitting: Internal Medicine

## 2024-04-08 ENCOUNTER — Encounter: Payer: Self-pay | Admitting: Internal Medicine

## 2024-04-08 VITALS — BP 130/70 | HR 70 | Ht 68.0 in | Wt 229.6 lb

## 2024-04-08 DIAGNOSIS — I10 Essential (primary) hypertension: Secondary | ICD-10-CM | POA: Diagnosis not present

## 2024-04-08 DIAGNOSIS — R058 Other specified cough: Secondary | ICD-10-CM

## 2024-04-08 MED ORDER — FAMOTIDINE 20 MG PO TABS
ORAL_TABLET | ORAL | 11 refills | Status: DC
Start: 2024-04-08 — End: 2024-05-14

## 2024-04-08 MED ORDER — PREDNISONE 10 MG PO TABS: ORAL_TABLET | ORAL | 0 refills | Status: DC

## 2024-04-08 MED ORDER — PANTOPRAZOLE SODIUM 40 MG PO TBEC: 40.0000 mg | DELAYED_RELEASE_TABLET | Freq: Every day | ORAL | 2 refills | Status: DC

## 2024-04-08 NOTE — Patient Instructions (Addendum)
 Take 4 for three days 3 for three days 2 for three days 1 for three days and stop   Pantoprazole  (protonix ) 40 mg   Take  30-60 min before first meal of the day and Pepcid  (famotidine )  20 mg after supper until return to office - this is the best way to tell whether stomach acid is contributing to your problem.    GERD (REFLUX)  is an extremely common cause of respiratory symptoms just like yours , many times with no obvious heartburn at all.    It can be treated with medication, but also with lifestyle changes including elevation of the head of your bed (ideally with 6 -8inch blocks under the headboard of your bed),  Smoking cessation, avoidance of late meals, excessive alcohol, and avoid fatty foods, chocolate, peppermint, colas, red wine, and acidic juices such as orange juice.  NO MINT OR MENTHOL PRODUCTS SO NO COUGH DROPS  USE SUGARLESS CANDY INSTEAD (Jolley ranchers or Stover's or Life Savers) or even ice chips will also do - the key is to swallow to prevent all throat clearing. NO OIL BASED VITAMINS - use powdered substitutes.  Avoid fish oil when coughing.   My office will be contacting you by phone for referral to [facility]   - if you don't hear back from my office within one week please call us  back or notify us  thru MyChart and we'll address it right away.   Please schedule a follow up office visit in 2  weeks, sooner if needed

## 2024-04-08 NOTE — Progress Notes (Signed)
 [name], male    DOB: May 19, 1956    MRN: [id number]   Brief patient profile:  62   yobm never  smoker with  referred to pulmonary clinic in [place]  03/09/2024 by Dr Lari for wheezing /coughing and nasal congestion sob  onset summer 2024   Pt not previously seen by PCCM service/ R HDelevation since at least 2012 and paced on advair in his 13s ( not really sure it helped)   Last prednisone  was p ER end of May  2025 seemed to help transiently    History of Present Illness  03/09/2024  Pulmonary/ 1st office eval/ Jacorian Golaszewski / [place] [facility] on trlegy and lisinopril   Chief Complaint  Patient presents with   Establish Care    DOE  Dyspnea:  neighborhood walker same speed as others Cough: nonproductive assoc with nasal congestion esp at hs  Sleep: bed is flat , head up on 3 pillows with cough hs x several min  SABA use: rarely needed  02: none  Rec Try off trelegy and lisinopril   Start olmesartan  40 mg one daily in place of lisinopril   If breathing worsens and you need more nebulizer after you finish prednisone  start back on  trelegy  Prednisone  10 mg take  4 each am x 2 days,  2 x 3 days,  2 each am x 2 days,  1 each am x 2 days and stop  Omnicef  300 mg twice daily x 10 days   Please schedule a follow up office visit in 4 weeks, sooner if needed  with all medications   04/08/2024  f/u ov/[place] office/Keiko Myricks re: UACS  ? Acei case  maint on not rx / did not bring   meds  Chief Complaint  Patient presents with   Follow-up    Increased SOB and wheezing over the past wk. Neb not helping. He has occ dry cough.   Dyspnea:  much better p prednisone  then worse p completed and no better p neb  Cough: assoc with nasal congestion and light yellow and streaks of blood - not change p omnicef   Sleeping: still flat on bed and 3 pillows  and  having new toubles at night with cough and wheeze and sob  SABA use: duoneb not helping  02: none  Also choking on food (points to SS notch where  food is blocked)    No obvious day to day or daytime variability or assoc   mucus plugs or hemoptysis or cp or chest tightness, subjective wheeze or overt   hb symptoms.    Also denies any obvious fluctuation of symptoms with weather or environmental changes or other aggravating or alleviating factors except as outlined above   No unusual exposure hx or h/o childhood pna/ asthma or knowledge of premature birth.  Current Allergies, Complete Past Medical History, Past Surgical History, Family History, and Social History were reviewed in [employer] record.  ROS  The following are not active complaints unless bolded Hoarseness, sore throat, dysphagia= globus sensation , dental problems, itching, sneezing,  nasal congestion or discharge of excess mucus or purulent secretions, ear ache,   fever, chills, sweats, unintended wt loss or wt gain, classically pleuritic or exertional cp,  orthopnea pnd or arm/hand swelling  or leg swelling, presyncope, palpitations, abdominal pain, anorexia, nausea, vomiting, diarrhea  or change in bowel habits or change in bladder habits, change in stools or change in urine, dysuria, hematuria,  rash, arthralgias, visual complaints, headache, numbness, weakness  or ataxia or problems with walking or coordination,  change in mood or  memory.        Current Meds  Medication Sig   albuterol  (PROVENTIL ) (2.5 MG/3ML) 0.083% nebulizer solution Take 3 mLs (2.5 mg total) by nebulization 3 (three) times daily.   albuterol  (VENTOLIN  HFA) 108 (90 Base) MCG/ACT inhaler Inhale 1 puff into the lungs every 4 (four) hours as needed.   amLODipine  (NORVASC ) 10 MG tablet Take 10 mg by mouth daily.   DULoxetine (CYMBALTA) 20 MG capsule Take 20 mg by mouth daily.   olmesartan  (BENICAR ) 40 MG tablet Take 1 tablet (40 mg total) by mouth daily.   oxyCODONE  (OXY IR/ROXICODONE ) 5 MG immediate release tablet Take 5 mg by mouth 4 (four) times daily as needed.   spironolactone  (ALDACTONE) 25 MG tablet Take 25 mg by mouth.            Past Medical History:  Diagnosis Date   Abdominal hernia    Asthma    BPH (benign prostatic hyperplasia)    Chronic pain    COPD (chronic obstructive pulmonary disease) (HCC)    Diabetes mellitus    Diastolic CHF (HCC)    Headache(784.0)    chronic, daily   Hyperlipidemia    Hypertension    Substance abuse (HCC)    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 Cocaine      Objective:    Wt Readings from Last 3 Encounters:  04/08/24 229 lb 9.6 oz (104.1 kg)  03/11/24 230 lb 3.2 oz (104.4 kg)  03/09/24 226 lb 3.2 oz (102.6 kg)      Vital signs reviewed  04/08/2024  - Note at rest 02 sats  97% on RA   General appearance:    extremely hoarse amb bm nad     HEENT : Oropharynx  clear /edentulous    Nasal turbinates mild edema/ no bloody or purulent secretions    NECK :  without  apparent JVD/ palpable Nodes/TM    LUNGS: no acc muscle use,  Nl contour chest which is clear to A and P  x for transmitted upper airway noise and decreased bs/ dullness at R base   CV:  RRR  no s3 or murmur or increase in P2, and no edema   ABD:  soft and nontender   MS:  Gait nl   ext warm without deformities Or obvious joint restrictions  calf tenderness, cyanosis or clubbing    SKIN: warm and dry without lesions    NEURO:  alert, approp, nl sensorium with  no motor or cerebellar deficits apparent.    I personally reviewed images and agree with radiology impression as follows:   Chest CTa  04/01/24    Mediastinum/Nodes: No enlarged mediastinal, hilar, or axillary lymph nodes. Thyroid  gland, trachea, and esophagus demonstrate no significant findings. Lungs/Pleura: No focal consolidations. No suspicious pulmonary nodules. No pleural effusion or pneumothorax.      Assessment

## 2024-04-08 NOTE — Assessment & Plan Note (Addendum)
 Onset ? 2024 while on acei an advair  then Trelegy > d/c'd - stop acei 03/09/2024 and rx empirically for rhinitis/sinustis with omnicef  and pred x 8 d - Allergy screen 03/09/2024 >  Eos 0.1/  IgE  369 - 04/08/2024 pred x 12 days/ start gerd rx and ENT consult  Upper airway cough syndrome (previously labeled PNDS),  is so named because it's frequently impossible to sort out how much is  CR/sinusitis with freq throat clearing (which can be related to primary GERD)   vs  causing  secondary ( extra esophageal)  GERD from wide swings in gastric pressure that occur with throat clearing, often  promoting self use of mint and menthol lozenges that reduce the lower esophageal sphincter tone and exacerbate the problem further in a cyclical fashion.   These are the same pts (now being labeled as having irritable larynx syndrome by some cough centers) who not infrequently have a history of having failed to tolerate ace inhibitors,  dry powder inhalers or biphosphonates or report having atypical/extraesophageal reflux symptoms(LPR)  that don't respond to standard doses of PPI  and are easily confused as having aecopd or asthma flares by even experienced allergists/ pulmonologists (myself included).   He has classic VCD findings today with neg CTa of upper airway ruling out mass effect but needs ENT eval then ST if dx is confirmed and in meantime rx acutely as above         Each maintenance medication was reviewed in detail including emphasizing most importantly the difference between maintenance and prns and under what circumstances the prns are to be triggered using an action plan format where appropriate.  Total time for H and P, chart review, counseling, reviewing neb  device(s) and generating customized AVS unique to this office visit / same day charting = 42 min with pt  for multiple  refractory respiratory  symptoms of uncertain etiology

## 2024-04-08 NOTE — Assessment & Plan Note (Signed)
 D/c acei  03/09/2024  due to chronic cough/ pseudowheeze   Although even in retrospect it may not be clear the ACEi contributed to the pt's symptoms,  Pt improved off them and adding them back at this point or in the future would risk confusion in interpretation of non-specific respiratory symptoms to which this patient is prone  ie  Better not to muddy the waters here.  >>> olemesartan 40 mg daily / f/u PCP

## 2024-04-09 ENCOUNTER — Ambulatory Visit (HOSPITAL_COMMUNITY): Admitting: Anesthesiology

## 2024-04-09 ENCOUNTER — Ambulatory Visit (HOSPITAL_COMMUNITY)
Admission: RE | Admit: 2024-04-09 | Discharge: 2024-04-09 | Disposition: A | Discharge status: Home / Self Care | Attending: Internal Medicine | Admitting: Internal Medicine

## 2024-04-09 ENCOUNTER — Other Ambulatory Visit: Payer: Self-pay

## 2024-04-09 ENCOUNTER — Encounter (HOSPITAL_COMMUNITY)
Admission: RE | Disposition: A | Discharge status: Home / Self Care | Payer: Self-pay | Source: Home / Self Care | Attending: Internal Medicine

## 2024-04-09 ENCOUNTER — Telehealth: Payer: Self-pay | Admitting: *Deleted

## 2024-04-09 ENCOUNTER — Encounter (HOSPITAL_COMMUNITY): Payer: Self-pay | Admitting: Internal Medicine

## 2024-04-09 DIAGNOSIS — F419 Anxiety disorder, unspecified: Secondary | ICD-10-CM | POA: Insufficient documentation

## 2024-04-09 DIAGNOSIS — R1310 Dysphagia, unspecified: Secondary | ICD-10-CM

## 2024-04-09 DIAGNOSIS — J441 Chronic obstructive pulmonary disease with (acute) exacerbation: Secondary | ICD-10-CM

## 2024-04-09 DIAGNOSIS — I130 Hypertensive heart and chronic kidney disease with heart failure and stage 1 through stage 4 chronic kidney disease, or unspecified chronic kidney disease: Secondary | ICD-10-CM | POA: Insufficient documentation

## 2024-04-09 DIAGNOSIS — I5032 Chronic diastolic (congestive) heart failure: Secondary | ICD-10-CM | POA: Insufficient documentation

## 2024-04-09 DIAGNOSIS — E1122 Type 2 diabetes mellitus with diabetic chronic kidney disease: Secondary | ICD-10-CM | POA: Insufficient documentation

## 2024-04-09 DIAGNOSIS — I5030 Unspecified diastolic (congestive) heart failure: Secondary | ICD-10-CM

## 2024-04-09 DIAGNOSIS — I110 Hypertensive heart disease with heart failure: Secondary | ICD-10-CM | POA: Diagnosis not present

## 2024-04-09 DIAGNOSIS — K3189 Other diseases of stomach and duodenum: Secondary | ICD-10-CM | POA: Insufficient documentation

## 2024-04-09 DIAGNOSIS — Z982 Presence of cerebrospinal fluid drainage device: Secondary | ICD-10-CM | POA: Diagnosis not present

## 2024-04-09 DIAGNOSIS — N189 Chronic kidney disease, unspecified: Secondary | ICD-10-CM | POA: Insufficient documentation

## 2024-04-09 DIAGNOSIS — R1319 Other dysphagia: Secondary | ICD-10-CM

## 2024-04-09 DIAGNOSIS — K222 Esophageal obstruction: Secondary | ICD-10-CM

## 2024-04-09 DIAGNOSIS — J449 Chronic obstructive pulmonary disease, unspecified: Secondary | ICD-10-CM | POA: Diagnosis not present

## 2024-04-09 DIAGNOSIS — J4489 Other specified chronic obstructive pulmonary disease: Secondary | ICD-10-CM | POA: Diagnosis not present

## 2024-04-09 HISTORY — PX: ESOPHAGEAL DILATION: SHX303

## 2024-04-09 HISTORY — PX: ESOPHAGOGASTRODUODENOSCOPY: SHX5428

## 2024-04-09 SURGERY — EGD (ESOPHAGOGASTRODUODENOSCOPY)
Anesthesia: General

## 2024-04-09 MED ORDER — SODIUM CHLORIDE 0.9% FLUSH: 3.0000 mL | Freq: Two times a day (BID) | INTRAVENOUS | Status: DC

## 2024-04-09 MED ORDER — LIDOCAINE 2% (20 MG/ML) 5 ML SYRINGE
INTRAMUSCULAR | Status: DC | PRN
  Administered 2024-04-09: 80 mg via INTRAVENOUS

## 2024-04-09 MED ORDER — LACTATED RINGERS IV SOLN: INTRAVENOUS | Status: DC | PRN

## 2024-04-09 MED ORDER — PROPOFOL 500 MG/50ML IV EMUL
INTRAVENOUS | Status: DC | PRN
  Administered 2024-04-09: 120 mg via INTRAVENOUS
  Administered 2024-04-09: 30 mg via INTRAVENOUS

## 2024-04-09 MED ORDER — LACTATED RINGERS IV SOLN: INTRAVENOUS | Status: DC

## 2024-04-09 MED ORDER — SODIUM CHLORIDE 0.9% FLUSH: 3.0000 mL | INTRAVENOUS | Status: DC | PRN

## 2024-04-09 NOTE — Telephone Encounter (Signed)
 Spoke with pt and he has been rescheduled to 8/21. He is aware he needs to be on clears for the entire day prior. He voiced understanding.

## 2024-04-09 NOTE — Anesthesia Preprocedure Evaluation (Addendum)
 Anesthesia Evaluation  Patient identified by MRN, date of birth, ID band Patient awake    Reviewed: Allergy & Precautions, H&P , NPO status , Patient's Chart, lab work & pertinent test results, reviewed documented beta blocker date and time   Airway Mallampati: II  TM Distance: >3 FB Neck ROM: full    Dental  (+) Upper Dentures, Lower Dentures   Pulmonary shortness of breath, asthma , pneumonia, resolved, COPD History of acute respiratory failure   Pulmonary exam normal breath sounds clear to auscultation       Cardiovascular hypertension, +CHF  Normal cardiovascular exam Rhythm:regular Rate:Normal  Diastolic CHF with preserved EF   Neuro/Psych  Headaches PSYCHIATRIC DISORDERS Anxiety     VP shunt and seizure prophylaxis    GI/Hepatic negative GI ROS, Neg liver ROS,,,  Endo/Other  diabetes, Type 2    Renal/GU CRFRenal disease  negative genitourinary   Musculoskeletal   Abdominal   Peds  Hematology negative hematology ROS (+)   Anesthesia Other Findings   Reproductive/Obstetrics negative OB ROS                              Anesthesia Physical Anesthesia Plan  ASA: 3  Anesthesia Plan: General   Post-op Pain Management: Minimal or no pain anticipated   Induction: Intravenous  PONV Risk Score and Plan: Propofol  infusion  Airway Management Planned: Nasal Cannula and Natural Airway  Additional Equipment: None  Intra-op Plan:   Post-operative Plan:   Informed Consent: I have reviewed the patients History and Physical, chart, labs and discussed the procedure including the risks, benefits and alternatives for the proposed anesthesia with the patient or authorized representative who has indicated his/her understanding and acceptance.     Dental Advisory Given  Plan Discussed with: CRNA  Anesthesia Plan Comments:          Anesthesia Quick Evaluation

## 2024-04-09 NOTE — Op Note (Signed)
 [facility] Patient Name: [name] Procedure Date: 04/09/2024 8:50 AM MRN: [id number] Date of Birth: 05-30-1956 Attending MD: Lamar Ozell Hollingshead , MD, [id number] CSN: [id number] Age: 68 Admit Type: Outpatient Procedure:                Upper GI endoscopy Indications:              Dysphagia Providers:                Lamar Ozell Hollingshead, MD, Rosina Sprague, Bascom Blush Referring MD:              Medicines:                Propofol  per Anesthesia Complications:            No immediate complications. Estimated Blood Loss:     Estimated blood loss: none. Procedure:                Pre-Anesthesia Assessment:                           - Prior to the procedure, a History and Physical                            was performed, and patient medications and                            allergies were reviewed. The patient's tolerance of                            previous anesthesia was also reviewed. The risks                            and benefits of the procedure and the sedation                            options and risks were discussed with the patient.                            All questions were answered, and informed consent                            was obtained. Prior Anticoagulants: The patient has                            taken no anticoagulant or antiplatelet agents. ASA                            Grade Assessment: II - A patient with mild systemic                            disease. After reviewing the risks and benefits,                            the patient was deemed in satisfactory condition to  undergo the procedure.                           After obtaining informed consent, the endoscope was                            passed under direct vision. Throughout the                            procedure, the patient's blood pressure, pulse, and                            oxygen  saturations were monitored continuously. The                             [id number]-H190 ([id number]) scope was introduced through the                            mouth, and advanced to the second part of duodenum.                            The upper GI endoscopy was accomplished without                            difficulty. The patient tolerated the procedure                            well. Scope In: 9:24:04 AM Scope Out: 9:28:30 AM Total Procedure Duration: 0 hours 4 minutes 26 seconds  Findings:      A mild Schatzki ring was found at the gastroesophageal junction. Large       amount of retained solid and liquid gastric contents. Precluded complete       examination. Pylorus was patent.      The duodenal bulb and second portion of the duodenum were normal. Impression:               - Mild Schatzki ring. Retained gastric contents                            precluded completion of examination and esophageal                            dilation                           - Normal duodenal bulb and second portion of the                            duodenum.                           - No specimens collected. Patient states last                            intake of solid food was 9:30 PM last night  approximately 12 hours prior to the procedure Moderate Sedation:      Moderate (conscious) sedation was personally administered by an       anesthesia professional. The following parameters were monitored: oxygen        saturation, heart rate, blood pressure, respiratory rate, EKG, adequacy       of pulmonary ventilation, and response to care. Recommendation:           - Patient has a contact number available for                            emergencies. The signs and symptoms of potential                            delayed complications were discussed with the                            patient. Return to normal activities tomorrow.                            Written discharge instructions were provided to the                            patient.                            - Advance diet as tolerated. Will get him                            rescheduled for a repeat EGD with possible                            dilation. He will need to be on a clear liquid diet                            the entire day prior to procedure. Procedure Code(s):        --- Professional ---                           [phone], Esophagogastroduodenoscopy, flexible,                            transoral; diagnostic, including collection of                            specimen(s) by brushing or washing, when performed                            (separate procedure) Diagnosis Code(s):        --- Professional ---                           K22.2, Esophageal obstruction                           R13.10, Dysphagia, unspecified CPT copyright 2022 American Medical Association. All rights reserved.  The codes documented in this report are preliminary and upon coder review may  be revised to meet current compliance requirements. Lamar HERO. [name], MD Lamar Ozell Hollingshead, MD 04/09/2024 9:57:11 AM This report has been signed electronically. Number of Addenda: 0

## 2024-04-09 NOTE — Transfer of Care (Signed)
 Immediate Anesthesia Transfer of Care Note  Patient: [name]  Procedure(s) Performed: EGD (ESOPHAGOGASTRODUODENOSCOPY) DILATION, ESOPHAGUS  Patient Location: Endoscopy Unit  Anesthesia Type:General  Level of Consciousness: awake and patient cooperative  Airway & Oxygen  Therapy: Patient Spontanous Breathing  Post-op Assessment: Report given to RN and Post -op Vital signs reviewed and stable  Post vital signs: Reviewed and stable  Last Vitals:  Vitals Value Taken Time  BP 94/67 04/09/24 09:33  Temp 36.6 C 04/09/24 09:33  Pulse 81 04/09/24 09:33  Resp 12 04/09/24 09:33  SpO2 96 % 04/09/24 09:33    Last Pain:  Vitals:   04/09/24 0933  TempSrc:   PainSc: 0-No pain      Patients Stated Pain Goal: 7 (04/09/24 0856)  Complications: No notable events documented.

## 2024-04-09 NOTE — Discharge Instructions (Addendum)
 EGD Discharge instructions Please read the instructions outlined below and refer to this sheet in the next few weeks. These discharge instructions provide you with general information on caring for yourself after you leave the hospital. Your doctor may also give you specific instructions. While your treatment has been planned according to the most current medical practices available, unavoidable complications occasionally occur. If you have any problems or questions after discharge, please call your doctor. ACTIVITY You may resume your regular activity but move at a slower pace for the next 24 hours.  Take frequent rest periods for the next 24 hours.  Walking will help expel (get rid of) the air and reduce the bloated feeling in your abdomen.  No driving for 24 hours (because of the anesthesia (medicine) used during the test).  You may shower.  Do not sign any important legal documents or operate any machinery for 24 hours (because of the anesthesia used during the test).  NUTRITION Drink plenty of fluids.  You may resume your normal diet.  Begin with a light meal and progress to your normal diet.  Avoid alcoholic beverages for 24 hours or as instructed by your caregiver.  MEDICATIONS You may resume your normal medications unless your caregiver tells you otherwise.  WHAT YOU CAN EXPECT TODAY You may experience abdominal discomfort such as a feeling of fullness or "gas" pains.  FOLLOW-UP Your doctor will discuss the results of your test with you.  SEEK IMMEDIATE MEDICAL ATTENTION IF ANY OF THE FOLLOWING OCCUR: Excessive nausea (feeling sick to your stomach) and/or vomiting.  Severe abdominal pain and distention (swelling).  Trouble swallowing.  Temperature over 101 F (37.8 C).  Rectal bleeding or vomiting of blood.     You had much retained food in your stomach.  Exam incomplete.  Could not stretch your esophagus today  You will need to go on a clear liquid diet the entire day before  the next EGD  My office will call to reschedule  Dr. Leisa started pantoprazole  or Protonix  40 mg daily yesterday.  That is a good idea.  Take that medication every day.  Further recommendations to follow.

## 2024-04-09 NOTE — Interval H&P Note (Signed)
 History and Physical Interval Note:  04/09/2024 9:03 AM  [name]  has presented today for surgery, with the diagnosis of DYSPHAGIA.  The various methods of treatment have been discussed with the patient and family. After consideration of risks, benefits and other options for treatment, the patient has consented to  Procedure(s) with comments: EGD (ESOPHAGOGASTRODUODENOSCOPY) (N/A) - 10:00 AM, OK RM 1/2 DILATION, ESOPHAGUS (N/A) as a surgical intervention.  The patient's history has been reviewed, patient examined, no change in status, stable for surgery.  I have reviewed the patient's chart and labs.  Questions were answered to the patient's satisfaction.     [name]  No change.  EGD with esophageal dilation today is reasonable/appropriate per plan.EGD Discharge instructions Please read the instructions outlined below and refer to this sheet in the next few weeks. These discharge instructions provide you with general information on caring for yourself after you leave the hospital. Your doctor may also give you specific instructions. While your treatment has been planned according to the most current medical practices available, unavoidable complications occasionally occur. If you have any problems or questions after discharge, please call your doctor. ACTIVITY You may resume your regular activity but move at a slower pace for the next 24 hours.  Take frequent rest periods for the next 24 hours.  Walking will help expel (get rid of) the air and reduce the bloated feeling in your abdomen.  No driving for 24 hours (because of the anesthesia (medicine) used during the test).  You may shower.  Do not sign any important legal documents or operate any machinery for 24 hours (because of the anesthesia used during the test).  NUTRITION Drink plenty of fluids.  You may resume your normal diet.  Begin with a light meal and progress to your normal diet.  Avoid alcoholic beverages for 24 hours  or as instructed by your caregiver.  MEDICATIONS You may resume your normal medications unless your caregiver tells you otherwise.  WHAT YOU CAN EXPECT TODAY You may experience abdominal discomfort such as a feeling of fullness or "gas" pains.  FOLLOW-UP Your doctor will discuss the results of your test with you.  SEEK IMMEDIATE MEDICAL ATTENTION IF ANY OF THE FOLLOWING OCCUR: Excessive nausea (feeling sick to your stomach) and/or vomiting.  Severe abdominal pain and distention (swelling).  Trouble swallowing.  Temperature over 101 F (37.8 C).  Rectal bleeding or vomiting of blood.

## 2024-04-09 NOTE — Telephone Encounter (Signed)
-----   Message from [name] sent at 04/09/2024  9:30 AM EDT ----- EGD incomplete they could not stretch his esophagus.  A large amount of retained food debris in his stomach.  ate 930 last night 12 hours prior to the procedure.  He needs to be turned around go on a clear liquid diet the entire day before the procedure just need to  bring him back for repeat

## 2024-04-09 NOTE — Anesthesia Postprocedure Evaluation (Signed)
 Anesthesia Post Note  Patient: [name]  Procedure(s) Performed: EGD (ESOPHAGOGASTRODUODENOSCOPY) DILATION, ESOPHAGUS  Patient location during evaluation: Endoscopy Anesthesia Type: General Level of consciousness: awake and alert Pain management: pain level controlled Vital Signs Assessment: post-procedure vital signs reviewed and stable Respiratory status: spontaneous breathing, nonlabored ventilation and respiratory function stable Cardiovascular status: blood pressure returned to baseline and stable Postop Assessment: no apparent nausea or vomiting Anesthetic complications: no   There were no known notable events for this encounter.   Last Vitals:  Vitals:   04/09/24 0856 04/09/24 0933  BP: (!) 136/92 94/67  Pulse: 90 81  Resp: 14 12  Temp: 36.7 C 36.6 C  SpO2: 93% 96%    Last Pain:  Vitals:   04/09/24 0933  TempSrc:   PainSc: 0-No pain                 Yacine Droz L Justene Jensen

## 2024-04-09 NOTE — Anesthesia Procedure Notes (Addendum)
 Date/Time: 04/09/2024 9:18 AM  Performed by: Barbarann Verneita RAMAN, CRNAPre-anesthesia Checklist: Patient identified, Emergency Drugs available, Suction available, Timeout performed and Patient being monitored Patient Re-evaluated:Patient Re-evaluated prior to induction Oxygen  Delivery Method: Nasal cannula Comments: Optiflow

## 2024-04-10 ENCOUNTER — Encounter (HOSPITAL_COMMUNITY): Payer: Self-pay | Admitting: Internal Medicine

## 2024-04-11 ENCOUNTER — Ambulatory Visit: Payer: Self-pay | Admitting: Gastroenterology

## 2024-04-15 DIAGNOSIS — E559 Vitamin D deficiency, unspecified: Secondary | ICD-10-CM | POA: Diagnosis not present

## 2024-04-15 DIAGNOSIS — I10 Essential (primary) hypertension: Secondary | ICD-10-CM | POA: Diagnosis not present

## 2024-04-15 DIAGNOSIS — N1831 Chronic kidney disease, stage 3a: Secondary | ICD-10-CM | POA: Diagnosis not present

## 2024-04-15 DIAGNOSIS — E1169 Type 2 diabetes mellitus with other specified complication: Secondary | ICD-10-CM | POA: Diagnosis not present

## 2024-04-15 DIAGNOSIS — D559 Anemia due to enzyme disorder, unspecified: Secondary | ICD-10-CM | POA: Diagnosis not present

## 2024-04-19 NOTE — Progress Notes (Unsigned)
 [name], male    DOB: 05-Jan-1956    MRN: [id number]   Brief patient profile:  65   yobm never  smoker with  referred to pulmonary clinic in [place]  03/09/2024 by Dr Lari for wheezing /coughing and nasal congestion sob  onset summer 2024   Pt not previously seen by PCCM service/ R HDelevation since at least 2012 and paced on advair in his 27s ( not really sure it helped)   Last prednisone  was p ER end of May  2025 seemed to help transiently    History of Present Illness  03/09/2024  Pulmonary/ 1st office eval/ Letty Salvi / [place] [facility] on trlegy and lisinopril   Chief Complaint  Patient presents with   Establish Care    DOE  Dyspnea:  neighborhood walker same speed as others Cough: nonproductive assoc with nasal congestion esp at hs  Sleep: bed is flat , head up on 3 pillows with cough hs x several min  SABA use: rarely needed  02: none  Rec Try off trelegy and lisinopril   Start olmesartan  40 mg one daily in place of lisinopril   If breathing worsens and you need more nebulizer after you finish prednisone  start back on  trelegy  Prednisone  10 mg take  4 each am x 2 days,  2 x 3 days,  2 each am x 2 days,  1 each am x 2 days and stop  Omnicef  300 mg twice daily x 10 days   Please schedule a follow up office visit in 4 weeks, sooner if needed  with all medications   04/08/2024  f/u ov/[place] [facility]/Julius Matus re: UACS  ? Acei case  maint on not rx / did not bring   meds  Chief Complaint  Patient presents with   Follow-up    Increased SOB and wheezing over the past wk. Neb not helping. He has occ dry cough.   Dyspnea:  much better p prednisone  then worse p completed and no better p neb  Cough: assoc with nasal congestion and light yellow and streaks of blood - not change p omnicef   Sleeping: still flat on bed and 3 pillows  and  having new toubles at night with cough and wheeze and sob  SABA use: duoneb not helping  02: none  Also choking on food (points to SS notch where  food is blocked)  Rec Take 4 for three days 3 for three days 2 for three days 1 for three days and stop  Pantoprazole  (protonix ) 40 mg   Take  30-60 min before first meal of the day and Pepcid  (famotidine )  20 mg after supper until return to office   GERD diet reviewed, bed blocks rec   My office will be contacting you by phone for referral to [facility]  > sept 2025       04/20/2024  f/u ov/[place] office/Chriss Redel re: UACS maint on Protonix  40 ac/ and pepcid  p supper   Chief Complaint  Patient presents with   Follow-up   Cough  Dyspnea:  denies limiting doe unless really overdoes it  Cough: better than it was/ sporadic now  Sleeping: able to lie flat bed 3 pillows due to choking s    resp cc  SABA use: just puffer once or twice a week never pre-exercise  02: none     No obvious day to day or daytime variability or assoc excess/ purulent sputum or mucus plugs or hemoptysis or cp or chest tightness, subjective wheeze  or overt  hb symptoms.    Also denies any obvious fluctuation of symptoms with weather or environmental changes or other aggravating or alleviating factors except as outlined above   No unusual exposure hx or h/o childhood pna/ asthma or knowledge of premature birth.  Current Allergies, Complete Past Medical History, Past Surgical History, Family History, and Social History were reviewed in [employer] record.  ROS  The following are not active complaints unless bolded Hoarseness, sore throat, dysphagia= globus  dental problems, itching, sneezing,  nasal congestion/ sensation of discharge of excess mucus or purulent secretions, ear ache,   fever, chills, sweats, unintended wt loss or wt gain, classically pleuritic or exertional cp,  orthopnea pnd or arm/hand swelling  or leg swelling, presyncope, palpitations, abdominal pain, anorexia, nausea, vomiting, diarrhea  or change in bowel habits or change in bladder habits, change in stools or change in  urine, dysuria, hematuria,  rash, arthralgias, visual complaints, headache, numbness, weakness or ataxia or problems with walking or coordination,  change in mood or  memory.        Current Meds  Medication Sig   albuterol  (PROVENTIL ) (2.5 MG/3ML) 0.083% nebulizer solution Take 3 mLs (2.5 mg total) by nebulization 3 (three) times daily.   albuterol  (VENTOLIN  HFA) 108 (90 Base) MCG/ACT inhaler Inhale 1 puff into the lungs every 4 (four) hours as needed.   amLODipine  (NORVASC ) 10 MG tablet Take 10 mg by mouth daily.   famotidine  (PEPCID ) 20 MG tablet One after supper   hydrochlorothiazide  (HYDRODIURIL ) 25 MG tablet Take 25 mg by mouth daily.   olmesartan  (BENICAR ) 40 MG tablet Take 1 tablet (40 mg total) by mouth daily.   oxyCODONE  (OXY IR/ROXICODONE ) 5 MG immediate release tablet Take 5 mg by mouth 4 (four) times daily as needed.   pantoprazole  (PROTONIX ) 40 MG tablet Take 1 tablet (40 mg total) by mouth daily. Take 30-60 min before first meal of the day   predniSONE  (DELTASONE ) 10 MG tablet Take 4 for three days 3 for three days 2 for three days 1 for three days and stop   spironolactone (ALDACTONE) 25 MG tablet Take 25 mg by mouth.            Past Medical History:  Diagnosis Date   Abdominal hernia    Asthma    BPH (benign prostatic hyperplasia)    Chronic pain    COPD (chronic obstructive pulmonary disease) (HCC)    Diabetes mellitus    Diastolic CHF (HCC)    Headache(784.0)    chronic, daily   Hyperlipidemia    Hypertension    Substance abuse (HCC)    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 Cocaine      Objective:    Wts  04/20/2024       227   04/08/24 229 lb 9.6 oz (104.1 kg)  03/11/24 230 lb 3.2 oz (104.4 kg)  03/09/24 226 lb 3.2 oz (102.6 kg)      Vital signs reviewed  04/20/2024  - Note at rest 02 sats  96% on RA   General appearance:    amb slt hoarse bm nad    HEENT : Oropharynx  clear      Nasal turbinates nl    NECK :  without  apparent JVD/ palpable Nodes/TM     LUNGS: no acc muscle use,  Nl contour chest with transmitted noise from the upper airway and decreased bs with dullness at R Base    CV:  RRR  no s3 or murmur or increase in P2, and no edema   ABD:  soft and nontender   MS:  Gait nl   ext warm without deformities Or obvious joint restrictions  calf tenderness, cyanosis or clubbing    SKIN: warm and dry without lesions    NEURO:  alert, approp, nl sensorium with  no motor or cerebellar deficits apparent.        I personally reviewed images and agree with radiology impression as follows:   Chest CTa  04/01/24    Wnl     Assessment

## 2024-04-20 ENCOUNTER — Encounter: Payer: Self-pay | Admitting: Internal Medicine

## 2024-04-20 ENCOUNTER — Ambulatory Visit (INDEPENDENT_AMBULATORY_CARE_PROVIDER_SITE_OTHER): Admitting: Internal Medicine

## 2024-04-20 ENCOUNTER — Telehealth: Payer: Self-pay | Admitting: Internal Medicine

## 2024-04-20 VITALS — BP 120/77 | HR 90 | Ht 68.0 in | Wt 227.8 lb

## 2024-04-20 DIAGNOSIS — I10 Essential (primary) hypertension: Secondary | ICD-10-CM

## 2024-04-20 DIAGNOSIS — R058 Other specified cough: Secondary | ICD-10-CM

## 2024-04-20 NOTE — Assessment & Plan Note (Addendum)
 D/c acei  03/09/2024  due to chronic cough/ pseudowheeze   >>> much better overall off acei with adequate bp control  Rec  refills per PCP at regular f/u ov and here prn   Each maintenance medication was reviewed in detail including emphasizing most importantly the difference between maintenance and prns and under what circumstances the prns are to be triggered using an action plan format where appropriate.  Total time for H and P, chart review, counseling, reviewing hfa/ neb  device(s) and generating customized AVS unique to this office visit / same day charting = 30 min summary final f/u ov

## 2024-04-20 NOTE — Assessment & Plan Note (Signed)
 Onset ? 2024 while on acei an advair  then Trelegy > d/c'd both 03/09/24  - stop acei 03/09/2024 and rx empirically for rhinitis/sinustis with omnicef  and pred x 8 d> improved but not resolved  - Allergy screen 03/09/2024 >  Eos 0.1/  IgE  369 - 04/08/2024 pred x 12 days/ start gerd rx and ENT consult  He has more cough than limiting doe but still can't lie down at hs without choking sensation and yet daytime symptoms are minimal and main finding on exam is prominent upper airway wheeze so rec  >>> continue max gerd rx and f/u with GI/ ent as planned  >>> pfts with f/v curve to eval extent of the upper airway problem? VCD  >>>  Re SABA :  I spent extra time with pt today reviewing appropriate use of albuterol  for prn use on exertion with the following points: 1) saba is for relief of sob that does not improve by walking a slower pace or resting but rather if the pt does not improve after trying this first. 2) If the pt is convinced, as many are, that saba helps recover from activity faster then it's easy to tell if this is the case by re-challenging : ie stop, take the inhaler, then p 5 minutes try the exact same activity (intensity of workload) that just caused the symptoms and see if they are substantially diminished or not after saba 3) if there is an activity that reproducibly causes the symptoms, try the saba 15 min before the activity on alternate days   If in fact the saba really does help, then fine to continue to use it prn but advised may need to look closer at the maintenance regimen (for now = 0)  being used to achieve better control of airways disease with exertion.

## 2024-04-20 NOTE — Patient Instructions (Signed)
 Also  Ok to try albuterol  15 min before an activity (on alternating days)  that you know would usually make you short of breath and see if it makes any difference and if makes none then don't take albuterol  after activity unless you can't catch your breath as this means it's the resting that helps, not the albuterol .      My office will be contacting you by phone for referral for PFTs  at [facility] - if you don't hear back from my office within one week please call us  back or notify us  thru MyChart and we'll address it right away   Pulmonary follow up is as needed after you complete your GI and ENT work up if not 100% satisfied with your cough and breathing.

## 2024-04-20 NOTE — Telephone Encounter (Signed)
 Spoke with patient regarding the 06/09/24 11:30 am PFT appointment [facility] time is 11:15 am--1st floor registration desk for check in---will mail information to patient and he voiced his understanding

## 2024-04-22 DIAGNOSIS — E1169 Type 2 diabetes mellitus with other specified complication: Secondary | ICD-10-CM | POA: Diagnosis not present

## 2024-04-22 DIAGNOSIS — E782 Mixed hyperlipidemia: Secondary | ICD-10-CM | POA: Diagnosis not present

## 2024-04-22 DIAGNOSIS — N1831 Chronic kidney disease, stage 3a: Secondary | ICD-10-CM | POA: Diagnosis not present

## 2024-04-22 DIAGNOSIS — J449 Chronic obstructive pulmonary disease, unspecified: Secondary | ICD-10-CM | POA: Diagnosis not present

## 2024-05-04 ENCOUNTER — Other Ambulatory Visit (HOSPITAL_COMMUNITY)
Admission: RE | Admit: 2024-05-04 | Discharge: 2024-05-04 | Disposition: A | Discharge status: Home / Self Care | Source: Ambulatory Visit | Attending: Internal Medicine | Admitting: Internal Medicine

## 2024-05-04 DIAGNOSIS — R1319 Other dysphagia: Secondary | ICD-10-CM | POA: Diagnosis not present

## 2024-05-04 LAB — BASIC METABOLIC PANEL WITH GFR
Anion gap: 6 (ref 5–15)
BUN: 21 mg/dL (ref 8–23)
CO2: 26 mmol/L (ref 22–32)
Calcium: 9.1 mg/dL (ref 8.9–10.3)
Chloride: 106 mmol/L (ref 98–111)
Creatinine, Ser: 1.3 mg/dL — ABNORMAL HIGH (ref 0.61–1.24)
GFR, Estimated: >60 mL/min (ref >60)
Glucose, Bld: 98 mg/dL (ref 70–99)
Potassium: 4.2 mmol/L (ref 3.5–5.1)
Sodium: 138 mmol/L (ref 135–145)

## 2024-05-05 DIAGNOSIS — M549 Dorsalgia, unspecified: Secondary | ICD-10-CM | POA: Diagnosis not present

## 2024-05-05 DIAGNOSIS — M2550 Pain in unspecified joint: Secondary | ICD-10-CM | POA: Diagnosis not present

## 2024-05-05 DIAGNOSIS — M25519 Pain in unspecified shoulder: Secondary | ICD-10-CM | POA: Diagnosis not present

## 2024-05-05 DIAGNOSIS — Z79891 Long term (current) use of opiate analgesic: Secondary | ICD-10-CM | POA: Diagnosis not present

## 2024-05-05 DIAGNOSIS — G43009 Migraine without aura, not intractable, without status migrainosus: Secondary | ICD-10-CM | POA: Diagnosis not present

## 2024-05-05 DIAGNOSIS — M792 Neuralgia and neuritis, unspecified: Secondary | ICD-10-CM | POA: Diagnosis not present

## 2024-05-05 DIAGNOSIS — R519 Headache, unspecified: Secondary | ICD-10-CM | POA: Diagnosis not present

## 2024-05-05 DIAGNOSIS — G894 Chronic pain syndrome: Secondary | ICD-10-CM | POA: Diagnosis not present

## 2024-05-05 DIAGNOSIS — M25559 Pain in unspecified hip: Secondary | ICD-10-CM | POA: Diagnosis not present

## 2024-05-06 ENCOUNTER — Ambulatory Visit: Payer: Self-pay | Admitting: *Deleted

## 2024-05-06 ENCOUNTER — Ambulatory Visit: Payer: Self-pay | Admitting: Family Medicine

## 2024-05-06 NOTE — Telephone Encounter (Signed)
 FYI Only or Action Required?: Action required by provider: clinical question for provider.  Patient is followed in Pulmonology for shortness of breath, last seen on 04/20/2024 by Darlean Ozell NOVAK, MD.  Called Nurse Triage reporting Shortness of Breath.  Triage Disposition: Go to ED Now (Notify PCP)  Patient/caregiver understands and will follow disposition?: No, refuses disposition               Copied from CRM [id number]. Topic: Clinical - Red Word Triage >> May 06, 2024 10:10 AM Rilla NOVAK wrote: [facility] that prompted transfer to Nurse Triage: Difficult breathing Reason for Disposition  Oxygen  level (e.g., pulse oximetry) 90% or lower  Answer Assessment - Initial Assessment Questions This RN spoke with pt's nurse aid, Robin. This RN recommends ED but pt refused. This RN notified [facility] of pt refusal of ED disposition.    RESPIRATORY STATUS: Describe your breathing? (e.g., wheezing, shortness of breath, unable to speak, severe coughing)      SOB, wheezing ONSET: When did this breathing problem begin?      Like this for a while  PATTERN Does the difficult breathing come and go, or has it been constant since it started?      Comes and goes OTHER SYMPTOMS: Do you have any other symptoms? (e.g., chest pain, cough, dizziness, fever, runny nose)     Dizzy a lot O2 SATURATION MONITOR:  Do you use an oxygen  saturation monitor (pulse oximeter) at home? If Yes, ask: What is your reading (oxygen  level) today? What is your usual oxygen  saturation reading? (e.g., 95%)       81-82%; normal is 84-85% per pt's nurse aid; no oxygen  use at home  Protocols used: Breathing Difficulty-A-AH

## 2024-05-06 NOTE — Telephone Encounter (Signed)
 FYI Only or Action Required?: FYI only for provider.  Patient was last seen in primary care on / from Dr. Lari.  Called Nurse Triage reporting Shortness of Breath.  Symptoms began chronic condition.  Interventions attempted: Other: has been in and out of hospital for sx .  Symptoms are: unchanged.  Triage Disposition: See PCP When Office is Open (Within 3 Days)  Patient/caregiver understands and will follow disposition?: Unsure               Copied from CRM [id number]. Topic: Appointments - Appointment Scheduling >> May 06, 2024  9:05 AM Selinda RAMAN wrote: Donia Lesches the sister in law called in along with the patient stating the patient has been in and out of the hospital for shortness of breath over the last couple of months.  He states he has used inhalers that heave not helped and he is still struggling. He is looking to become a new patient at [facility] with Rosaline Bruns. I will transfer him to E2C2 NT Reason for Disposition  [1] MODERATE longstanding difficulty breathing (e.g., speaks in phrases, SOB even at rest, pulse 100-120) AND [2] SAME as normal  Answer Assessment - Initial Assessment Questions 1. RESPIRATORY STATUS: Describe your breathing? (e.g., wheezing, shortness of breath, unable to speak, severe coughing)      Shortness of breath at rest worse with mobility , wheezing at times 2. ONSET: When did this breathing problem begin?      Couple of months ago  3. PATTERN Does the difficult breathing come and go, or has it been constant since it started?      Comes and goes 4. SEVERITY: How bad is your breathing? (e.g., mild, moderate, severe)      Mild  5. RECURRENT SYMPTOM: Have you had difficulty breathing before? If Yes, ask: When was the last time? and What happened that time?      Yes has been to hospital 6. CARDIAC HISTORY: Do you have any history of heart disease? (e.g., heart attack, angina, bypass surgery,  angioplasty)      See hx  7. LUNG HISTORY: Do you have any history of lung disease?  (e.g., pulmonary embolus, asthma, emphysema)     Hx asthma COPD 8. CAUSE: What do you think is causing the breathing problem?      Hx  9. OTHER SYMPTOMS: Do you have any other symptoms? (e.g., chest pain, cough, dizziness, fever, runny nose)     Cough productive at times yellow mucus,  10. O2 SATURATION MONITOR:  Do you use an oxygen  saturation monitor (pulse oximeter) at home? If Yes, ask: What is your reading (oxygen  level) today? What is your usual oxygen  saturation reading? (e.g., 95%)       na 11. PREGNANCY: Is there any chance you are pregnant? When was your last menstrual period?       na 12. TRAVEL: Have you traveled out of the country in the last month? (e.g., travel history, exposures)       na  Protocols used: Breathing Difficulty-A-AH  Reason for Disposition  [1] MODERATE longstanding difficulty breathing (e.g., speaks in phrases, SOB even at rest, pulse 100-120) AND [2] SAME as normal  Answer Assessment - Initial Assessment Questions Patient and sister in law Doris calling in to request new patient appt for [facility] with L. Rakes, FNP. New patient appt scheduled for 10/07/24. Recommended if sx worsen go to previous PCP or UC/ ED. Patient requesting if earlier appt for new  patient available please call.      1. RESPIRATORY STATUS: Describe your breathing? (e.g., wheezing, shortness of breath, unable to speak, severe coughing)      Shortness of breath at rest worse with mobility , wheezing at times 2. ONSET: When did this breathing problem begin?      Couple of months ago  3. PATTERN Does the difficult breathing come and go, or has it been constant since it started?      Comes and goes 4. SEVERITY: How bad is your breathing? (e.g., mild, moderate, severe)      Mild  5. RECURRENT SYMPTOM: Have you had difficulty breathing before? If Yes, ask: When was the last  time? and What happened that time?      Yes has been to hospital 6. CARDIAC HISTORY: Do you have any history of heart disease? (e.g., heart attack, angina, bypass surgery, angioplasty)      See hx  7. LUNG HISTORY: Do you have any history of lung disease?  (e.g., pulmonary embolus, asthma, emphysema)     Hx asthma COPD 8. CAUSE: What do you think is causing the breathing problem?      Hx  9. OTHER SYMPTOMS: Do you have any other symptoms? (e.g., chest pain, cough, dizziness, fever, runny nose)     Cough productive at times yellow mucus,  10. O2 SATURATION MONITOR:  Do you use an oxygen  saturation monitor (pulse oximeter) at home? If Yes, ask: What is your reading (oxygen  level) today? What is your usual oxygen  saturation reading? (e.g., 95%)       na 11. PREGNANCY: Is there any chance you are pregnant? When was your last menstrual period?       na 12. TRAVEL: Have you traveled out of the country in the last month? (e.g., travel history, exposures)       na  Protocols used: Breathing Difficulty-A-AH

## 2024-05-08 DIAGNOSIS — G8929 Other chronic pain: Secondary | ICD-10-CM | POA: Diagnosis not present

## 2024-05-08 DIAGNOSIS — R0602 Shortness of breath: Secondary | ICD-10-CM | POA: Diagnosis not present

## 2024-05-08 DIAGNOSIS — Z66 Do not resuscitate: Secondary | ICD-10-CM | POA: Diagnosis not present

## 2024-05-08 DIAGNOSIS — R0689 Other abnormalities of breathing: Secondary | ICD-10-CM | POA: Diagnosis not present

## 2024-05-08 DIAGNOSIS — Z885 Allergy status to narcotic agent status: Secondary | ICD-10-CM | POA: Diagnosis not present

## 2024-05-08 DIAGNOSIS — J441 Chronic obstructive pulmonary disease with (acute) exacerbation: Secondary | ICD-10-CM | POA: Diagnosis not present

## 2024-05-08 DIAGNOSIS — I509 Heart failure, unspecified: Secondary | ICD-10-CM | POA: Diagnosis not present

## 2024-05-08 DIAGNOSIS — I110 Hypertensive heart disease with heart failure: Secondary | ICD-10-CM | POA: Diagnosis not present

## 2024-05-08 DIAGNOSIS — R9389 Abnormal findings on diagnostic imaging of other specified body structures: Secondary | ICD-10-CM | POA: Diagnosis not present

## 2024-05-08 DIAGNOSIS — M546 Pain in thoracic spine: Secondary | ICD-10-CM | POA: Diagnosis not present

## 2024-05-08 DIAGNOSIS — Z743 Need for continuous supervision: Secondary | ICD-10-CM | POA: Diagnosis not present

## 2024-05-08 DIAGNOSIS — R0600 Dyspnea, unspecified: Secondary | ICD-10-CM | POA: Diagnosis not present

## 2024-05-08 DIAGNOSIS — Z79899 Other long term (current) drug therapy: Secondary | ICD-10-CM | POA: Diagnosis not present

## 2024-05-08 DIAGNOSIS — R918 Other nonspecific abnormal finding of lung field: Secondary | ICD-10-CM | POA: Diagnosis not present

## 2024-05-08 DIAGNOSIS — R062 Wheezing: Secondary | ICD-10-CM | POA: Diagnosis not present

## 2024-05-14 ENCOUNTER — Ambulatory Visit (HOSPITAL_COMMUNITY): Admitting: Anesthesiology

## 2024-05-14 ENCOUNTER — Ambulatory Visit (HOSPITAL_COMMUNITY)
Admission: RE | Admit: 2024-05-14 | Discharge: 2024-05-14 | Disposition: A | Discharge status: Home / Self Care | Attending: Internal Medicine | Admitting: Internal Medicine

## 2024-05-14 ENCOUNTER — Encounter (HOSPITAL_COMMUNITY): Payer: Self-pay | Admitting: Internal Medicine

## 2024-05-14 ENCOUNTER — Other Ambulatory Visit: Payer: Self-pay

## 2024-05-14 ENCOUNTER — Encounter (HOSPITAL_COMMUNITY)
Admission: RE | Disposition: A | Discharge status: Home / Self Care | Payer: Self-pay | Source: Home / Self Care | Attending: Internal Medicine

## 2024-05-14 DIAGNOSIS — J441 Chronic obstructive pulmonary disease with (acute) exacerbation: Secondary | ICD-10-CM

## 2024-05-14 DIAGNOSIS — E1122 Type 2 diabetes mellitus with diabetic chronic kidney disease: Secondary | ICD-10-CM | POA: Diagnosis not present

## 2024-05-14 DIAGNOSIS — K222 Esophageal obstruction: Secondary | ICD-10-CM

## 2024-05-14 DIAGNOSIS — R0602 Shortness of breath: Secondary | ICD-10-CM | POA: Diagnosis not present

## 2024-05-14 DIAGNOSIS — Z79899 Other long term (current) drug therapy: Secondary | ICD-10-CM | POA: Insufficient documentation

## 2024-05-14 DIAGNOSIS — I110 Hypertensive heart disease with heart failure: Secondary | ICD-10-CM | POA: Diagnosis not present

## 2024-05-14 DIAGNOSIS — Z833 Family history of diabetes mellitus: Secondary | ICD-10-CM | POA: Insufficient documentation

## 2024-05-14 DIAGNOSIS — R1310 Dysphagia, unspecified: Secondary | ICD-10-CM | POA: Diagnosis not present

## 2024-05-14 DIAGNOSIS — I5032 Chronic diastolic (congestive) heart failure: Secondary | ICD-10-CM | POA: Insufficient documentation

## 2024-05-14 DIAGNOSIS — I509 Heart failure, unspecified: Secondary | ICD-10-CM

## 2024-05-14 DIAGNOSIS — J449 Chronic obstructive pulmonary disease, unspecified: Secondary | ICD-10-CM | POA: Diagnosis not present

## 2024-05-14 DIAGNOSIS — F419 Anxiety disorder, unspecified: Secondary | ICD-10-CM | POA: Diagnosis not present

## 2024-05-14 DIAGNOSIS — I130 Hypertensive heart and chronic kidney disease with heart failure and stage 1 through stage 4 chronic kidney disease, or unspecified chronic kidney disease: Secondary | ICD-10-CM | POA: Insufficient documentation

## 2024-05-14 DIAGNOSIS — R519 Headache, unspecified: Secondary | ICD-10-CM | POA: Diagnosis not present

## 2024-05-14 DIAGNOSIS — N189 Chronic kidney disease, unspecified: Secondary | ICD-10-CM | POA: Insufficient documentation

## 2024-05-14 HISTORY — PX: ESOPHAGOGASTRODUODENOSCOPY: SHX5428

## 2024-05-14 HISTORY — PX: ESOPHAGEAL DILATION: SHX303

## 2024-05-14 SURGERY — EGD (ESOPHAGOGASTRODUODENOSCOPY)
Anesthesia: General

## 2024-05-14 MED ORDER — LACTATED RINGERS IV SOLN: INTRAVENOUS | Status: DC | PRN

## 2024-05-14 MED ORDER — DEXMEDETOMIDINE HCL IN NACL 80 MCG/20ML IV SOLN
INTRAVENOUS | Status: DC | PRN
  Administered 2024-05-14: 8 ug via INTRAVENOUS

## 2024-05-14 MED ORDER — PROPOFOL 500 MG/50ML IV EMUL
INTRAVENOUS | Status: DC | PRN
  Administered 2024-05-14: 150 ug/kg/min via INTRAVENOUS

## 2024-05-14 MED ORDER — GLYCOPYRROLATE PF 0.2 MG/ML IJ SOSY
PREFILLED_SYRINGE | INTRAMUSCULAR | Status: DC | PRN
  Administered 2024-05-14: .2 mg via INTRAVENOUS

## 2024-05-14 MED ORDER — PROPOFOL 10 MG/ML IV BOLUS
INTRAVENOUS | Status: DC | PRN
  Administered 2024-05-14: 50 mg via INTRAVENOUS

## 2024-05-14 MED ORDER — KETAMINE HCL 50 MG/5ML IJ SOSY
PREFILLED_SYRINGE | INTRAMUSCULAR | Status: AC
  Filled 2024-05-14: qty 5

## 2024-05-14 NOTE — H&P (Signed)
 @LOGO @   Primary Care Physician:  Lari Elspeth BRAVO, MD Primary Gastroenterologist:  Dr. Shaaron  Pre-Procedure History & Physical: HPI:  [name] is a 68 y.o. male here for   Further evaluation of dysphagia via EGD with esophageal dilation is feasible/appropriate.     EGD last month was hampered by retained gastric contents patient was found to have a Schatzki's ring.  No gastric outlet obstruction.  He is here for repeat attempted EGD with esophageal dilation.  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    Past Surgical History:  Procedure Laterality Date   BRAIN SURGERY     COLON SURGERY  06/2011   Diverticulitis Complicated   ESOPHAGEAL DILATION N/A 04/09/2024   Procedure: DILATION, ESOPHAGUS;  Surgeon: Shaaron Lamar HERO, MD;  Location: [facility];  Service: Endoscopy;  Laterality: N/A;   ESOPHAGOGASTRODUODENOSCOPY N/A 04/09/2024   Procedure: EGD (ESOPHAGOGASTRODUODENOSCOPY);  Surgeon: Shaaron Lamar HERO, MD;  Location: [facility];  Service: Endoscopy;  Laterality: N/A;  10:00 AM, [place] RM 1/2   MANDIBLE SURGERY     VENTRICULOPERITONEAL SHUNT      Prior to Admission medications   Medication Sig Start Date End Date Taking? Authorizing Provider  albuterol  (PROVENTIL ) (2.5 MG/3ML) 0.083% nebulizer solution Take 3 mLs (2.5 mg total) by nebulization 3 (three) times daily. 12/03/16  Yes Antoinette Doe, MD  albuterol  (VENTOLIN  HFA) 108 (90 Base) MCG/ACT inhaler Inhale 1 puff into  the lungs every 4 (four) hours as needed. 01/01/24  Yes [provider]  amLODipine  (NORVASC ) 10 MG tablet Take 10 mg by mouth daily.   Yes [provider]  hydrochlorothiazide  (HYDRODIURIL ) 25 MG tablet Take 25 mg by mouth daily.   Yes [provider]  olmesartan  (BENICAR ) 40 MG tablet Take 1 tablet (40 mg total) by mouth daily. 03/09/24 03/09/25 Yes Darlean Ozell NOVAK, MD  oxyCODONE  (OXY IR/ROXICODONE ) 5 MG immediate release tablet Take 5 mg by mouth 4 (four) times daily as needed. 03/10/24  Yes [provider]  predniSONE  (DELTASONE ) 10 MG tablet Take 4 for three days 3 for three days 2 for three days 1 for three days and stop 04/08/24  Yes Darlean Ozell NOVAK, MD  DULoxetine (CYMBALTA) 20 MG capsule Take 20 mg by mouth daily. Patient not taking: Reported on 04/20/2024 03/10/24   [provider]  famotidine  (PEPCID ) 20 MG tablet One after supper 04/08/24   Darlean Ozell NOVAK, MD  pantoprazole  (PROTONIX ) 40 MG tablet Take 1 tablet (40 mg total) by mouth daily. Take 30-60 min before first meal of the day 04/08/24   Darlean Ozell NOVAK, MD  spironolactone (ALDACTONE) 25 MG tablet Take 25 mg by mouth. Patient not taking: Reported on 05/14/2024 05/03/23   [provider]    Allergies as of 04/09/2024 - Review Complete 04/09/2024  Allergen Reaction Noted  Venlafaxine Other (See Comments) 08/07/2018   Morphine Itching     Family History  Problem Relation Age of Onset   Hypertension Mother    Hyperlipidemia Mother    Depression Mother    Hypertension Father    Hyperlipidemia Father    Diabetes Father    Cancer - Lung Father    Arthritis Other    Asthma Other    Colon cancer Neg Hx     Social History   Socioeconomic History   Marital status: Legally Separated    Spouse name: Not on file   Number of children: Not on file   Years of education: 12   Highest education level: Not on file  Occupational History   Not on file  Tobacco Use   Smoking status:  Never   Smokeless tobacco: Never  Vaping Use   Vaping status: Never Used  Substance and Sexual Activity   Alcohol use: No   Drug use: No    Types: Crack cocaine    Comment: Quit 1999   Sexual activity: Yes  Other Topics Concern   Not on file  Social History Narrative   Not on file   Social Drivers of Health   Financial Resource Strain: Not on file  Food Insecurity: Not on file  Transportation Needs: Not on file  Physical Activity: Not on file  Stress: Not on file  Social Connections: Not on file  Intimate Partner Violence: Not on file    Review of Systems: See HPI, otherwise negative ROS  Physical Exam: BP 127/88   Pulse 86   Temp 98.5 F (36.9 C)   Resp (!) 21   Ht 5' 8 (1.727 m)   Wt 106.6 kg   SpO2 98%   BMI 35.73 kg/m  General:   Alert,  Well-developed, well-nourished, pleasant and cooperative in NAD Neck:  Supple; no masses or thyromegaly. No significant cervical adenopathy. Lungs:  Clear throughout to auscultation.   No wheezes, crackles, or rhonchi. No acute distress. Heart:  Regular rate and rhythm; no murmurs, clicks, rubs,  or gallops. Abdomen: Non-distended, normal bowel sounds.  Soft and nontender without appreciable mass or hepatosplenomegaly.   Impression/Plan:    69 year old gentleman with esophageal dysphagia known Schatzki's ring.  Unable to dilate last month because of retained gastric contents.  I have offered the patient a EGD today with esophageal dilation is feasible/appropriate per plan. The risks, benefits, limitations, alternatives and imponderables have been reviewed with the patient. Potential for esophageal dilation, biopsy, etc. have also been reviewed.  Questions have been answered. All parties agreeable.      Notice: This dictation was prepared with Dragon dictation along with smaller phrase technology. Any transcriptional errors that result from this process are unintentional and may not be corrected upon review.

## 2024-05-14 NOTE — Discharge Instructions (Addendum)
 EGD Discharge instructions Please read the instructions outlined below and refer to this sheet in the next few weeks. These discharge instructions provide you with general information on caring for yourself after you leave the hospital. Your doctor may also give you specific instructions. While your treatment has been planned according to the most current medical practices available, unavoidable complications occasionally occur. If you have any problems or questions after discharge, please call your doctor. ACTIVITY You may resume your regular activity but move at a slower pace for the next 24 hours.  Take frequent rest periods for the next 24 hours.  Walking will help expel (get rid of) the air and reduce the bloated feeling in your abdomen.  No driving for 24 hours (because of the anesthesia (medicine) used during the test).  You may shower.  Do not sign any important legal documents or operate any machinery for 24 hours (because of the anesthesia used during the test).  NUTRITION Drink plenty of fluids.  You may resume your normal diet.  Begin with a light meal and progress to your normal diet.  Avoid alcoholic beverages for 24 hours or as instructed by your caregiver.  MEDICATIONS You may resume your normal medications unless your caregiver tells you otherwise.  WHAT YOU CAN EXPECT TODAY You may experience abdominal discomfort such as a feeling of fullness or "gas" pains.  FOLLOW-UP Your doctor will discuss the results of your test with you.  SEEK IMMEDIATE MEDICAL ATTENTION IF ANY OF THE FOLLOWING OCCUR: Excessive nausea (feeling sick to your stomach) and/or vomiting.  Severe abdominal pain and distention (swelling).  Trouble swallowing.  Temperature over 101 F (37.8 C).  Rectal bleeding or vomiting of blood.     You had a Schatzki's ring found today.  It was dilated.  these are caused in part by acid reflux.  It is recommended you resume Protonix  or pantoprazole  40 mg once  daily 30 minutes before breakfast.  My office is calling in a new prescription to your pharmacy   office visit with us  in 6 months to see how you are doing

## 2024-05-14 NOTE — Transfer of Care (Signed)
 Immediate Anesthesia Transfer of Care Note  Patient: [name]  Procedure(s) Performed: EGD (ESOPHAGOGASTRODUODENOSCOPY) DILATION, ESOPHAGUS  Patient Location: PACU  Anesthesia Type:MAC  Level of Consciousness: awake and alert   Airway & Oxygen  Therapy: Patient Spontanous Breathing and Patient connected to nasal cannula oxygen   Post-op Assessment: Report given to RN and Post -op Vital signs reviewed and stable  Post vital signs: Reviewed and stable  Last Vitals:  Vitals Value Taken Time  BP 104/71 05/14/24 10:20  Temp 36.5 C 05/14/24 10:20  Pulse 83 05/14/24 10:20  Resp 17 05/14/24 10:20  SpO2 99 % 05/14/24 10:20    Last Pain:  Vitals:   05/14/24 1020  TempSrc: Axillary  PainSc:       Patients Stated Pain Goal: 7 (05/14/24 0820)  Complications: No notable events documented.

## 2024-05-14 NOTE — Anesthesia Preprocedure Evaluation (Signed)
 Anesthesia Evaluation  Patient identified by MRN, date of birth, ID band Patient awake    Reviewed: Allergy & Precautions, H&P , NPO status , Patient's Chart, lab work & pertinent test results, reviewed documented beta blocker date and time   Airway Mallampati: II  TM Distance: >3 FB Neck ROM: full    Dental no notable dental hx.    Pulmonary shortness of breath, asthma , pneumonia, COPD   Pulmonary exam normal breath sounds clear to auscultation       Cardiovascular Exercise Tolerance: Good hypertension, +CHF   Rhythm:regular Rate:Normal     Neuro/Psych  Headaches PSYCHIATRIC DISORDERS Anxiety        GI/Hepatic negative GI ROS, Neg liver ROS,,,  Endo/Other  diabetes    Renal/GU Renal disease  negative genitourinary   Musculoskeletal   Abdominal   Peds  Hematology negative hematology ROS (+)   Anesthesia Other Findings   Reproductive/Obstetrics negative OB ROS                              Anesthesia Physical Anesthesia Plan  ASA: 3  Anesthesia Plan: General   Post-op Pain Management:    Induction:   PONV Risk Score and Plan: Propofol  infusion  Airway Management Planned:   Additional Equipment:   Intra-op Plan:   Post-operative Plan:   Informed Consent: I have reviewed the patients History and Physical, chart, labs and discussed the procedure including the risks, benefits and alternatives for the proposed anesthesia with the patient or authorized representative who has indicated his/her understanding and acceptance.     Dental Advisory Given  Plan Discussed with: CRNA  Anesthesia Plan Comments:         Anesthesia Quick Evaluation

## 2024-05-14 NOTE — Op Note (Signed)
 [facility] Patient Name: [name] Procedure Date: 05/14/2024 9:48 AM MRN: [id number] Date of Birth: 1956/05/19 Attending MD: Lamar Ozell Hollingshead , MD, [id number] CSN: [id number] Age: 68 Admit Type: Outpatient Procedure:                Upper GI endoscopy Indications:              Dysphagia Providers:                Lamar Ozell Hollingshead, MD, Madelin Hunter, RN, Jon Loge Referring MD:              Medicines:                Propofol  per Anesthesia Complications:            No immediate complications. Estimated Blood Loss:     Estimated blood loss was minimal. Procedure:                Pre-Anesthesia Assessment:                           - Prior to the procedure, a History and Physical                            was performed, and patient medications and                            allergies were reviewed. The patient's tolerance of                            previous anesthesia was also reviewed. The risks                            and benefits of the procedure and the sedation                            options and risks were discussed with the patient.                            All questions were answered, and informed consent                            was obtained. Prior Anticoagulants: The patient has                            taken no anticoagulant or antiplatelet agents. ASA                            Grade Assessment: III - A patient with severe                            systemic disease. After reviewing the risks and  benefits, the patient was deemed in satisfactory                            condition to undergo the procedure.                           After obtaining informed consent, the endoscope was                            passed under direct vision. Throughout the                            procedure, the patient's blood pressure, pulse, and                            oxygen  saturations were  monitored continuously. The                            [id number]-[id number] ([id number]) Upper was introduced through                            the mouth, and advanced to the second part of                            duodenum. The upper GI endoscopy was accomplished                            without difficulty. The patient tolerated the                            procedure well. Scope In: 10:07:01 AM Scope Out: 10:15:03 AM Total Procedure Duration: 0 hours 8 minutes 2 seconds  Findings:      A moderate Schatzki ring was found at the gastroesophageal junction. No       esophagitis. No nodularity. .      The entire examined stomach was normal.      The duodenal bulb and second portion of the duodenum were normal. The       scope was withdrawn. Dilation was performed with a Maloney dilator with       mild resistance at 56 Fr. Dilation was performed with a Maloney dilator       with mild resistance at 58 Fr. The dilation site was examined following       endoscope reinsertion and showed no change. Estimated blood loss: none. Impression:               - Moderate Schatzki ring. Dilated /disrupted.                           - Normal stomach.                           - Normal duodenal bulb and second portion of the                            duodenum.                           -  No specimens collected. Moderate Sedation:      Moderate (conscious) sedation was personally administered by an       anesthesia professional. The following parameters were monitored: oxygen        saturation, heart rate, blood pressure, respiratory rate, EKG, adequacy       of pulmonary ventilation, and response to care. Recommendation:           - Patient has a contact number available for                            emergencies. The signs and symptoms of potential                            delayed complications were discussed with the                            patient. Return to normal activities tomorrow.                             Written discharge instructions were provided to the                            patient.                           - Advance diet as tolerated.                           - Continue present medications. Resume Protonix  40                            mg once daily 30 minutes before breakfast.                           - Return to my office in 6 months. Procedure Code(s):        --- Professional ---                           [phone], Esophagogastroduodenoscopy, flexible,                            transoral; diagnostic, including collection of                            specimen(s) by brushing or washing, when performed                            (separate procedure)                           43450, Dilation of esophagus, by unguided sound or                            bougie, single or multiple passes Diagnosis Code(s):        --- Professional ---  K22.2, Esophageal obstruction                           R13.10, Dysphagia, unspecified CPT copyright 2022 [employer] Association. All rights reserved. The codes documented in this report are preliminary and upon coder review may  be revised to meet current compliance requirements. Lamar HERO. Vernadine Coombs, MD Lamar Ozell Hollingshead, MD 05/14/2024 10:26:15 AM This report has been signed electronically. Number of Addenda: 0

## 2024-05-15 ENCOUNTER — Encounter (HOSPITAL_COMMUNITY): Payer: Self-pay | Admitting: Internal Medicine

## 2024-05-15 ENCOUNTER — Ambulatory Visit: Admitting: Internal Medicine

## 2024-05-15 DIAGNOSIS — R058 Other specified cough: Secondary | ICD-10-CM

## 2024-05-15 NOTE — Progress Notes (Deleted)
 Lamir Racca, male    DOB: 1956/04/09    MRN: [id number]   Brief patient profile:  27   yobm never  smoker with  referred to pulmonary clinic in [place]  03/09/2024 by Dr Lari for wheezing /coughing and nasal congestion sob  onset summer 2024   Pt not previously seen by PCCM service/ R HDelevation since at least 2012 and paced on advair in his 28s ( not really sure it helped)   Last prednisone  was p ER end of May  2025 seemed to help transiently    History of Present Illness  03/09/2024  Pulmonary/ 1st office eval/ Lataisha Colan / [place] [facility] on trlegy and lisinopril   Chief Complaint  Patient presents with   Establish Care    DOE  Dyspnea:  neighborhood walker same speed as others Cough: nonproductive assoc with nasal congestion esp at hs  Sleep: bed is flat , head up on 3 pillows with cough hs x several min  SABA use: rarely needed  02: none  Rec Try off trelegy and lisinopril   Start olmesartan  40 mg one daily in place of lisinopril   If breathing worsens and you need more nebulizer after you finish prednisone  start back on  trelegy  Prednisone  10 mg take  4 each am x 2 days,  2 x 3 days,  2 each am x 2 days,  1 each am x 2 days and stop  Omnicef  300 mg twice daily x 10 days   Please schedule a follow up office visit in 4 weeks, sooner if needed  with all medications   04/08/2024  f/u ov/[place] office/Jacklin Zwick re: UACS  ? Acei case  maint on not rx / did not bring   meds  Chief Complaint  Patient presents with   Follow-up    Increased SOB and wheezing over the past wk. Neb not helping. He has occ dry cough.   Dyspnea:  much better p prednisone  then worse p completed and no better p neb  Cough: assoc with nasal congestion and light yellow and streaks of blood - not change p omnicef   Sleeping: still flat on bed and 3 pillows  and  having new toubles at night with cough and wheeze and sob  SABA use: duoneb not helping  02: none  Also choking on food (points to SS notch where  food is blocked)  Rec Take 4 for three days 3 for three days 2 for three days 1 for three days and stop  Pantoprazole  (protonix ) 40 mg   Take  30-60 min before first meal of the day and Pepcid  (famotidine )  20 mg after supper until return to office   GERD diet reviewed, bed blocks rec   My office will be contacting you by phone for referral to [facility]  > sept 2025    04/20/2024  f/u ov/[place] office/Vali Capano re: UACS maint on Protonix  40 ac/ and pepcid  p supper   Chief Complaint  Patient presents with   Follow-up   Cough  Dyspnea:  denies limiting doe unless really overdoes it  Cough: better than it was/ sporadic now  Sleeping: able to lie flat bed 3 pillows due to choking s    resp cc  SABA use: just puffer once or twice a week never pre-exercise  02: none  Rec Also  Ok to try albuterol  15 min before an activity (on alternating days)  that you know would usually make you short of breath   My office will  be contacting you by phone for referral for PFTs  at [facility] not done as of 05/15/2024    Pulmonary follow up is as needed after you complete your GI and ENT work up if not 100% satisfied with your cough and breathing.    05/15/2024  f/u ov/[place] office/Zeinab Rodwell re: *** maint on ***  No chief complaint on file.   Dyspnea:  *** Cough: *** Sleeping: ***   resp cc  SABA use: *** 02: ***  Lung cancer screening: ***   No obvious day to day or daytime variability or assoc excess/ purulent sputum or mucus plugs or hemoptysis or cp or chest tightness, subjective wheeze or overt sinus or hb symptoms.    Also denies any obvious fluctuation of symptoms with weather or environmental changes or other aggravating or alleviating factors except as outlined above   No unusual exposure hx or h/o childhood pna/ asthma or knowledge of premature birth.  Current Allergies, Complete Past Medical History, Past Surgical History, Family History, and Social History were reviewed in  [employer] record.  ROS  The following are not active complaints unless bolded Hoarseness, sore throat, dysphagia, dental problems, itching, sneezing,  nasal congestion or discharge of excess mucus or purulent secretions, ear ache,   fever, chills, sweats, unintended wt loss or wt gain, classically pleuritic or exertional cp,  orthopnea pnd or arm/hand swelling  or leg swelling, presyncope, palpitations, abdominal pain, anorexia, nausea, vomiting, diarrhea  or change in bowel habits or change in bladder habits, change in stools or change in urine, dysuria, hematuria,  rash, arthralgias, visual complaints, headache, numbness, weakness or ataxia or problems with walking or coordination,  change in mood or  memory.        No outpatient medications have been marked as taking for the 05/15/24 encounter (Appointment) with Darlean Ozell NOVAK, MD.             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

## 2024-05-18 ENCOUNTER — Telehealth: Payer: Self-pay

## 2024-05-18 MED ORDER — PANTOPRAZOLE SODIUM 40 MG PO TBEC: 40.0000 mg | DELAYED_RELEASE_TABLET | Freq: Every day | ORAL | 11 refills | Status: DC

## 2024-05-18 NOTE — Telephone Encounter (Signed)
 Rx sent to pharmacy on file.

## 2024-05-18 NOTE — Telephone Encounter (Signed)
-----   Message from Lamar Hollingshead sent at 05/14/2024 10:26 AM EDT -----  new prescription Protonix  40 mg pill.  Dispense 30.  Take 1 daily 30 minutes for breakfast.  11 refills.

## 2024-05-19 NOTE — Anesthesia Postprocedure Evaluation (Signed)
 Anesthesia Post Note  Patient: Truitt Currington  Procedure(s) Performed: EGD (ESOPHAGOGASTRODUODENOSCOPY) DILATION, ESOPHAGUS  Patient location during evaluation: Phase II Anesthesia Type: General Level of consciousness: awake Pain management: pain level controlled Vital Signs Assessment: post-procedure vital signs reviewed and stable Respiratory status: spontaneous breathing and respiratory function stable Cardiovascular status: blood pressure returned to baseline and stable Postop Assessment: no headache and no apparent nausea or vomiting Anesthetic complications: no Comments: Late entry   No notable events documented.   Last Vitals:  Vitals:   05/14/24 1020 05/14/24 1024  BP: 104/71 113/82  Pulse: 83 82  Resp: 17 18  Temp: 36.5 C   SpO2: 99% 99%    Last Pain:  Vitals:   05/14/24 1024  TempSrc:   PainSc: 0-No pain                 Yvonna JINNY Bosworth

## 2024-06-01 DIAGNOSIS — M25559 Pain in unspecified hip: Secondary | ICD-10-CM | POA: Diagnosis not present

## 2024-06-01 DIAGNOSIS — M549 Dorsalgia, unspecified: Secondary | ICD-10-CM | POA: Diagnosis not present

## 2024-06-01 DIAGNOSIS — G894 Chronic pain syndrome: Secondary | ICD-10-CM | POA: Diagnosis not present

## 2024-06-01 DIAGNOSIS — R519 Headache, unspecified: Secondary | ICD-10-CM | POA: Diagnosis not present

## 2024-06-01 DIAGNOSIS — M25519 Pain in unspecified shoulder: Secondary | ICD-10-CM | POA: Diagnosis not present

## 2024-06-01 DIAGNOSIS — G43009 Migraine without aura, not intractable, without status migrainosus: Secondary | ICD-10-CM | POA: Diagnosis not present

## 2024-06-01 DIAGNOSIS — M2550 Pain in unspecified joint: Secondary | ICD-10-CM | POA: Diagnosis not present

## 2024-06-01 DIAGNOSIS — M792 Neuralgia and neuritis, unspecified: Secondary | ICD-10-CM | POA: Diagnosis not present

## 2024-06-07 DIAGNOSIS — G8929 Other chronic pain: Secondary | ICD-10-CM | POA: Diagnosis not present

## 2024-06-07 DIAGNOSIS — Z66 Do not resuscitate: Secondary | ICD-10-CM | POA: Diagnosis not present

## 2024-06-07 DIAGNOSIS — R9431 Abnormal electrocardiogram [ECG] [EKG]: Secondary | ICD-10-CM | POA: Diagnosis not present

## 2024-06-07 DIAGNOSIS — R079 Chest pain, unspecified: Secondary | ICD-10-CM | POA: Diagnosis not present

## 2024-06-07 DIAGNOSIS — R0789 Other chest pain: Secondary | ICD-10-CM | POA: Diagnosis not present

## 2024-06-07 DIAGNOSIS — J9811 Atelectasis: Secondary | ICD-10-CM | POA: Diagnosis not present

## 2024-06-07 DIAGNOSIS — J441 Chronic obstructive pulmonary disease with (acute) exacerbation: Secondary | ICD-10-CM | POA: Diagnosis not present

## 2024-06-07 DIAGNOSIS — R0602 Shortness of breath: Secondary | ICD-10-CM | POA: Diagnosis not present

## 2024-06-07 DIAGNOSIS — M5440 Lumbago with sciatica, unspecified side: Secondary | ICD-10-CM | POA: Diagnosis not present

## 2024-06-07 DIAGNOSIS — M545 Low back pain, unspecified: Secondary | ICD-10-CM | POA: Diagnosis not present

## 2024-06-09 ENCOUNTER — Ambulatory Visit (HOSPITAL_COMMUNITY): Admission: RE | Admit: 2024-06-09 | Source: Ambulatory Visit

## 2024-06-09 ENCOUNTER — Ambulatory Visit: Admitting: Internal Medicine

## 2024-06-14 NOTE — Progress Notes (Unsigned)
 [name], male    DOB: May 19, 1956    MRN: [id number]   Brief patient profile:  49   yobm never  smoker with  referred to pulmonary clinic in [place]  03/09/2024 by Dr Lari for wheezing /coughing and nasal congestion sob  onset summer 2024   Pt not previously seen by PCCM service/ R HDelevation since at least 2012 and paced on advair in his 73s ( not really sure it helped)   Last prednisone  was p ER end of May  2025 seemed to help transiently    History of Present Illness  03/09/2024  Pulmonary/ 1st office eval/ Jari Dipasquale / [place] [facility] on trlegy and lisinopril   Chief Complaint  Patient presents with   Establish Care    DOE  Dyspnea:  neighborhood walker same speed as others Cough: nonproductive assoc with nasal congestion esp at hs  Sleep: bed is flat , head up on 3 pillows with cough hs x several min  SABA use: rarely needed  02: none  Rec Try off trelegy and lisinopril   Start olmesartan  40 mg one daily in place of lisinopril   If breathing worsens and you need more nebulizer after you finish prednisone  start back on  trelegy  Prednisone  10 mg take  4 each am x 2 days,  2 x 3 days,  2 each am x 2 days,  1 each am x 2 days and stop  Omnicef  300 mg twice daily x 10 days   Please schedule a follow up office visit in 4 weeks, sooner if needed  with all medications   04/08/2024  f/u ov/[place] office/Jesse Hirst re: UACS  ? Acei case  maint on not rx / did not bring   meds  Chief Complaint  Patient presents with   Follow-up    Increased SOB and wheezing over the past wk. Neb not helping. He has occ dry cough.   Dyspnea:  much better p prednisone  then worse p completed and no better p neb  Cough: assoc with nasal congestion and light yellow and streaks of blood - not change p omnicef   Sleeping: still flat on bed and 3 pillows  and  having new toubles at night with cough and wheeze and sob  SABA use: duoneb not helping  02: none  Also choking on food (points to SS notch where  food is blocked)  Rec Take 4 for three days 3 for three days 2 for three days 1 for three days and stop  Pantoprazole  (protonix ) 40 mg   Take  30-60 min before first meal of the day and Pepcid  (famotidine )  20 mg after supper until return to office   GERD diet reviewed, bed blocks rec   My office will be contacting you by phone for referral to [facility]  > sept 2025    04/20/2024  f/u ov/ office/Yuleni Burich re: UACS maint on Protonix  40 ac/ and pepcid  p supper   Chief Complaint  Patient presents with   Follow-up   Cough  Dyspnea:  denies limiting doe unless really overdoes it  Cough: better than it was/ sporadic now  Sleeping: able to lie flat bed 3 pillows due to choking s    resp cc  SABA use: just puffer once or twice a week never pre-exercise  02: none  Rec Also  Ok to try albuterol  15 min before an activity (on alternating days)  that you know would usually make you short of breath   My office will  be contacting you by phone for referral for PFTs  at [facility] not done as of 06/18/2024    Pulmonary follow up is as needed after you complete your GI and ENT work up if not 100% satisfied with your cough and breathing.    06/18/2024  f/u ov/[place] office/Rutha Melgoza re: *** maint on ***  No chief complaint on file.   Dyspnea:  *** Cough: *** Sleeping: ***   resp cc  SABA use: *** 02: ***  Lung cancer screening: ***   No obvious day to day or daytime variability or assoc excess/ purulent sputum or mucus plugs or hemoptysis or cp or chest tightness, subjective wheeze or overt sinus or hb symptoms.    Also denies any obvious fluctuation of symptoms with weather or environmental changes or other aggravating or alleviating factors except as outlined above   No unusual exposure hx or h/o childhood pna/ asthma or knowledge of premature birth.  Current Allergies, Complete Past Medical History, Past Surgical History, Family History, and Social History were reviewed in  [employer] record.  ROS  The following are not active complaints unless bolded Hoarseness, sore throat, dysphagia, dental problems, itching, sneezing,  nasal congestion or discharge of excess mucus or purulent secretions, ear ache,   fever, chills, sweats, unintended wt loss or wt gain, classically pleuritic or exertional cp,  orthopnea pnd or arm/hand swelling  or leg swelling, presyncope, palpitations, abdominal pain, anorexia, nausea, vomiting, diarrhea  or change in bowel habits or change in bladder habits, change in stools or change in urine, dysuria, hematuria,  rash, arthralgias, visual complaints, headache, numbness, weakness or ataxia or problems with walking or coordination,  change in mood or  memory.        No outpatient medications have been marked as taking for the 06/18/24 encounter (Appointment) with Darlean Ozell NOVAK, MD.             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

## 2024-06-16 ENCOUNTER — Institutional Professional Consult (permissible substitution) (INDEPENDENT_AMBULATORY_CARE_PROVIDER_SITE_OTHER): Admitting: Otolaryngology

## 2024-06-16 DIAGNOSIS — J441 Chronic obstructive pulmonary disease with (acute) exacerbation: Secondary | ICD-10-CM | POA: Diagnosis not present

## 2024-06-16 DIAGNOSIS — E782 Mixed hyperlipidemia: Secondary | ICD-10-CM | POA: Diagnosis not present

## 2024-06-16 DIAGNOSIS — E1169 Type 2 diabetes mellitus with other specified complication: Secondary | ICD-10-CM | POA: Diagnosis not present

## 2024-06-16 DIAGNOSIS — N1831 Chronic kidney disease, stage 3a: Secondary | ICD-10-CM | POA: Diagnosis not present

## 2024-06-18 ENCOUNTER — Ambulatory Visit: Admitting: Internal Medicine

## 2024-06-18 ENCOUNTER — Encounter: Payer: Self-pay | Admitting: Internal Medicine

## 2024-06-18 VITALS — BP 137/89 | HR 86 | Temp 97.2°F | Ht 68.0 in | Wt 228.8 lb

## 2024-06-18 DIAGNOSIS — I10 Essential (primary) hypertension: Secondary | ICD-10-CM

## 2024-06-18 DIAGNOSIS — R058 Other specified cough: Secondary | ICD-10-CM

## 2024-06-18 MED ORDER — FAMOTIDINE 20 MG PO TABS: ORAL_TABLET | ORAL | 11 refills | Status: AC

## 2024-06-18 MED ORDER — AMOXICILLIN-POT CLAVULANATE 875-125 MG PO TABS: 1.0000 | ORAL_TABLET | Freq: Two times a day (BID) | ORAL | 0 refills | Status: DC

## 2024-06-18 MED ORDER — PREDNISONE 10 MG PO TABS: ORAL_TABLET | ORAL | 0 refills | Status: DC

## 2024-06-18 MED ORDER — MONTELUKAST SODIUM 10 MG PO TABS: 10.0000 mg | ORAL_TABLET | Freq: Every day | ORAL | 11 refills | Status: AC

## 2024-06-18 NOTE — Patient Instructions (Addendum)
 Prednisone  Take 4 for three days 3 for three days 2 for three days 1 for three days and stop   Start singulair  (montelukast )  and pepcid  20mg  (famotidine   an hour before bedtime   Augmentin  875 mg take one pill twice daily  X 14 days - take at breakfast and supper with large glass of water.  It would help reduce the usual side effects (diarrhea and yeast infections) if you ate cultured yogurt at lunch.   My office will be contacting you by phone for referral to [facility] in [place]  and PFTs  in [place]   - if you don't hear back from my office within one week please call us  back or notify us  thru MyChart and we'll address it right away.   Please schedule a follow up visit in 3 months but call sooner if needed  with all medications and inhaler

## 2024-06-18 NOTE — Assessment & Plan Note (Addendum)
 Onset ? 2024 while on acei an advair  then Trelegy > d/c'd both 03/09/24  - stop acei 03/09/2024 and rx empirically for rhinitis/sinustis with omnicef  and pred x 8 d> improved but not resolved  - Allergy screen 03/09/2024 >  Eos 0.1/  IgE  369 - 04/08/2024 pred x 12 days/ start gerd rx and ENT consult> reconsulted 06/18/2024  - 06/18/2024  repeat prec x 12 dasy and start singulair  for nasal congestion / pseudowheeze > ent eval p 14 days of AUGMENTIN  and hold off inhalers for now and just use saba hfa/ nebs to get less Upper airway irritation but if wheezing or increased need for saba then add budesonide  0.25 mg up to q 4 h in the neb prn (neb version of air supra) until next ov and then look at longer acting options

## 2024-06-18 NOTE — Assessment & Plan Note (Addendum)
 Echo 01/26/22   1. The left ventricle is normal in size with mildly to moderately increased  wall thickness.    2. The left ventricular systolic function is normal, LVEF is visually  estimated at > 55%.    3. There is grade I diastolic dysfunction (impaired relaxation).    4. 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    5. The right ventricle is upper normal in size, with low normal systolic  function.  D/c acei  03/09/2024  due to chronic cough/ pseudowheeze   Bp ok and no evidence of chf suggested by last pCXR but note BNP in intermediate range so need to keep bp down and low threshold repeat ECHO          Each maintenance medication was reviewed in detail including emphasizing most importantly the difference between maintenance and prns and under what circumstances the prns are to be triggered using an action plan format where appropriate.  Total time for H and P, chart review, counseling,  use of neb and hfa sab/ and generating customized AVS unique to this office visit / same day charting = 42 min for  refractory respiratory  symptoms of uncertain etiology

## 2024-06-23 ENCOUNTER — Ambulatory Visit (HOSPITAL_COMMUNITY)
Admission: RE | Admit: 2024-06-23 | Discharge: 2024-06-23 | Disposition: A | Discharge status: Home / Self Care | Source: Ambulatory Visit | Attending: Internal Medicine | Admitting: Internal Medicine

## 2024-06-23 DIAGNOSIS — J4489 Other specified chronic obstructive pulmonary disease: Secondary | ICD-10-CM | POA: Diagnosis not present

## 2024-06-23 DIAGNOSIS — R058 Other specified cough: Secondary | ICD-10-CM | POA: Insufficient documentation

## 2024-06-23 LAB — PULMONARY FUNCTION TEST
DL/VA % pred: 140 %
DL/VA: 5.76 ml/min/mmHg/L
DLCO cor % pred: 84 %
DLCO cor: 20.81 ml/min/mmHg
DLCO unc % pred: 75 %
DLCO unc: 18.65 ml/min/mmHg
FEF 25-75 Post: 1.51 L/s
FEF 25-75 Pre: 0.58 L/s
FEF2575-%Change-Post: 159 %
FEF2575-%Pred-Post: 61 %
FEF2575-%Pred-Pre: 23 %
FEV1-%Change-Post: 41 %
FEV1-%Pred-Post: 53 %
FEV1-%Pred-Pre: 37 %
FEV1-Post: 1.66 L
FEV1-Pre: 1.17 L
FEV1FVC-%Change-Post: 9 %
FEV1FVC-%Pred-Pre: 77 %
FEV6-%Change-Post: 27 %
FEV6-%Pred-Post: 63 %
FEV6-%Pred-Pre: 49 %
FEV6-Post: 2.52 L
FEV6-Pre: 1.99 L
FEV6FVC-%Change-Post: -2 %
FEV6FVC-%Pred-Post: 101 %
FEV6FVC-%Pred-Pre: 103 %
FVC-%Change-Post: 29 %
FVC-%Pred-Post: 62 %
FVC-%Pred-Pre: 48 %
FVC-Post: 2.64 L
FVC-Pre: 2.04 L
Post FEV1/FVC ratio: 63 %
Post FEV6/FVC ratio: 96 %
Pre FEV1/FVC ratio: 57 %
Pre FEV6/FVC Ratio: 98 %
RV % pred: 181 %
RV: 4.14 L
TLC % pred: 98 %
TLC: 6.51 L

## 2024-06-23 MED ORDER — ALBUTEROL SULFATE (2.5 MG/3ML) 0.083% IN NEBU
2.5000 mg | INHALATION_SOLUTION | Freq: Once | RESPIRATORY_TRACT | Status: AC
Start: 2024-06-23 — End: 2024-06-23
  Administered 2024-06-23: 2.5 mg via RESPIRATORY_TRACT

## 2024-06-27 ENCOUNTER — Ambulatory Visit: Payer: Self-pay | Admitting: Internal Medicine

## 2024-06-28 MED ORDER — BUDESONIDE 0.25 MG/2ML IN SUSP: RESPIRATORY_TRACT | 12 refills | Status: DC

## 2024-06-29 DIAGNOSIS — Z79891 Long term (current) use of opiate analgesic: Secondary | ICD-10-CM | POA: Diagnosis not present

## 2024-06-29 NOTE — Progress Notes (Signed)
 Spoke with pt regarding results pt confirmed understanding

## 2024-07-09 ENCOUNTER — Encounter (INDEPENDENT_AMBULATORY_CARE_PROVIDER_SITE_OTHER): Payer: Self-pay | Admitting: Otolaryngology

## 2024-07-09 ENCOUNTER — Ambulatory Visit (INDEPENDENT_AMBULATORY_CARE_PROVIDER_SITE_OTHER): Admitting: Otolaryngology

## 2024-07-09 ENCOUNTER — Institutional Professional Consult (permissible substitution) (INDEPENDENT_AMBULATORY_CARE_PROVIDER_SITE_OTHER): Admitting: Otolaryngology

## 2024-07-09 VITALS — BP 158/92 | HR 72

## 2024-07-09 DIAGNOSIS — R0981 Nasal congestion: Secondary | ICD-10-CM

## 2024-07-09 DIAGNOSIS — R040 Epistaxis: Secondary | ICD-10-CM

## 2024-07-09 DIAGNOSIS — K219 Gastro-esophageal reflux disease without esophagitis: Secondary | ICD-10-CM

## 2024-07-09 DIAGNOSIS — R0982 Postnasal drip: Secondary | ICD-10-CM

## 2024-07-09 DIAGNOSIS — R0989 Other specified symptoms and signs involving the circulatory and respiratory systems: Secondary | ICD-10-CM

## 2024-07-09 DIAGNOSIS — R053 Chronic cough: Secondary | ICD-10-CM | POA: Diagnosis not present

## 2024-07-09 MED ORDER — SALINE SPRAY 0.65 % NA SOLN: 1.0000 | NASAL | 5 refills | Status: AC | PRN

## 2024-07-09 MED ORDER — LEVOCETIRIZINE DIHYDROCHLORIDE 5 MG PO TABS: 5.0000 mg | ORAL_TABLET | Freq: Every evening | ORAL | 3 refills | Status: DC

## 2024-07-09 MED ORDER — FLUTICASONE PROPIONATE 50 MCG/ACT NA SUSP
2.0000 | Freq: Every day | NASAL | 6 refills | Status: AC
Start: 2024-07-09 — End: ?

## 2024-07-09 NOTE — Progress Notes (Signed)
 ENT CONSULT:  Reason for Consult: productive cough   HPI: Discussed the use of AI scribe software for clinical note transcription with the patient, who gave verbal consent to proceed.  History of Present Illness [name] is a 68 year old male who presents with chronic cough and episode of nasal bleeding this morning, which resolved without interventions.  He has a chronic cough characterized by the production of a significant amount of phlegm, especially in the morning. The cough is persistent but not severe. No history of smoking or other lung diseases. He uses an Albuterol  inhaler, which he finds helpful. He saw pulmonary.  He experiences epistaxis, primarily from the left side, which he noticed this morning. No use of blood thinners or nasal sprays. He has a history of nasal dryness.  He has a history of reflux and has undergone an upper endoscopy with a gastroenterologist which did not reveal anything per report. He is unsure about his current medication regimen for reflux but has been prescribed Pepcid  and Protonix  in the past.   Records Reviewed:  Office visit Dr Darlean 04/08/24 68   yobm never  smoker with  referred to pulmonary clinic in [place]  03/09/2024 by Dr Lari for wheezing /coughing and nasal congestion sob  onset summer 2024    Pt not previously seen by PCCM service/ R HDelevation since at least 2012 and paced on advair in his 105s ( not really sure it helped)    Last prednisone  was p ER end of May  2025 seemed to help transiently    Past Medical History:  Diagnosis Date   (HFpEF) heart failure with preserved ejection fraction (HCC)    Abdominal hernia    Anxiety    Asthma    BPH (benign prostatic hyperplasia)    Chronic pain    CKD (chronic kidney disease)    COPD (chronic obstructive pulmonary disease) (HCC)    Diabetes mellitus    Diastolic CHF (HCC)    Headache(784.0)    chronic, daily   History of incarceration    released 02/2023   Hydrocephalus  [facility])    s/p VP shunt in 2005   Hyperlipidemia    Hypertension    Substance abuse (HCC)    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 Cocaine    Past Surgical History:  Procedure Laterality Date   BRAIN SURGERY     COLON SURGERY  06/2011   Diverticulitis Complicated   ESOPHAGEAL DILATION N/A 04/09/2024   Procedure: DILATION, ESOPHAGUS;  Surgeon: Shaaron Lamar HERO, MD;  [facility]: [facility];  Service: Endoscopy;  Laterality: N/A;   ESOPHAGEAL DILATION N/A 05/14/2024   Procedure: DILATION, ESOPHAGUS;  Surgeon: Shaaron Lamar HERO, MD;  Location: [facility];  Service: Endoscopy;  Laterality: N/A;   ESOPHAGOGASTRODUODENOSCOPY N/A 04/09/2024   Procedure: EGD (ESOPHAGOGASTRODUODENOSCOPY);  Surgeon: Shaaron Lamar HERO, MD;  Location: [facility];  Service: Endoscopy;  Laterality: N/A;  10:00 AM, [place] RM 1/2   ESOPHAGOGASTRODUODENOSCOPY N/A 05/14/2024   Procedure: EGD (ESOPHAGOGASTRODUODENOSCOPY);  Surgeon: Shaaron Lamar HERO, MD;  Location: [facility];  Service: Endoscopy;  Laterality: N/A;  930AM, [place] RM 1-2   MANDIBLE SURGERY     VENTRICULOPERITONEAL SHUNT      Family History  Problem Relation Age of Onset   Hypertension Mother    Hyperlipidemia Mother    Depression Mother    Hypertension Father    Hyperlipidemia Father    Diabetes Father    Cancer - Lung Father    Arthritis  Other    Asthma Other    Colon cancer Neg Hx     Social History:  reports that he has never smoked. He has never used smokeless tobacco. He reports that he does not drink alcohol and does not use drugs.  Allergies:  Allergies  Allergen Reactions   Venlafaxine Other (See Comments)   Morphine Itching    Medications: I have reviewed the patient's current medications.  The PMH, PSH, Medications, Allergies, and SH were reviewed and updated.  ROS: Constitutional: Negative for fever, weight loss and weight gain. Cardiovascular: Negative for chest pain and dyspnea on exertion. Respiratory: Is not experiencing shortness of breath at  rest. Gastrointestinal: Negative for nausea and vomiting. Neurological: Negative for headaches. Psychiatric: The patient is not nervous/anxious  Blood pressure (!) 158/92, pulse 72, SpO2 93%. There is no height or weight on file to calculate BMI.  PHYSICAL EXAM:  Exam: General: Well-developed, well-nourished Communication and Voice: Clear pitch and clarity Respiratory Respiratory effort: Equal inspiration and expiration without stridor Cardiovascular Peripheral Vascular: Warm extremities with equal color/perfusion Eyes: No nystagmus with equal extraocular motion bilaterally Neuro/Psych/Balance: Patient oriented to person, place, and time; Appropriate mood and affect; Gait is intact with no imbalance; Cranial nerves I-XII are intact Head and Face Inspection: Normocephalic and atraumatic without mass or lesion Palpation: Facial skeleton intact without bony stepoffs Salivary Glands: No mass or tenderness Facial Strength: Facial motility symmetric and full bilaterally ENT Pinna: External ear intact and fully developed External canal: Canal is patent with intact skin Tympanic Membrane: Clear and mobile External Nose: No scar or anatomic deformity Internal Nose: Septum is S-shaped, dry blood tinged crusting along the septum. No polyp, or purulence. Mucosal edema and erythema present.  Bilateral inferior turbinate hypertrophy.  Lips, Teeth, and gums: Mucosa and teeth intact and viable TMJ: No pain to palpation with full mobility Oral cavity/oropharynx: No erythema or exudate, no lesions present Nasopharynx: No mass or lesion with intact mucosa Hypopharynx: Intact mucosa without pooling of secretions Larynx Glottic: Full true vocal cord mobility without lesion or mass Supraglottic: Normal appearing epiglottis and AE folds Interarytenoid Space: Moderate pachydermia&edema Subglottic Space: Patent without lesion or edema Neck Neck and Trachea: Midline trachea without mass or  lesion Thyroid : No mass or nodularity Lymphatics: No lymphadenopathy  Procedure: Preoperative diagnosis: chronic productive cough  Postoperative diagnosis:   Same + GERD LPR  Procedure: Flexible fiberoptic laryngoscopy  Surgeon: Elena Larry, MD  Anesthesia: Topical lidocaine  and Afrin Complications: None Condition is stable throughout exam  Indications and consent:  The patient presents to the clinic with above symptoms. Indirect laryngoscopy view was incomplete. Thus it was recommended that they undergo a flexible fiberoptic laryngoscopy. All of the risks, benefits, and potential complications were reviewed with the patient preoperatively and verbal informed consent was obtained.  Procedure: The patient was seated upright in the clinic. Topical lidocaine  and Afrin were applied to the nasal cavity. After adequate anesthesia had occurred, I then proceeded to pass the flexible telescope into the nasal cavity. The nasal cavity was patent without rhinorrhea or polyp. The nasopharynx was also patent without mass or lesion. The base of tongue was visualized and was normal. There were no signs of pooling of secretions in the piriform sinuses. The true vocal folds were mobile bilaterally. There were no signs of glottic or supraglottic mucosal lesion or mass. There was moderate interarytenoid pachydermia and post cricoid edema. The telescope was then slowly withdrawn and the patient tolerated the procedure throughout.   Studies  Reviewed: CXR 2 V 09/27/2017 FINDINGS: There is chronic elevation of the right diaphragm. There is no focal parenchymal opacity. There is no pleural effusion or pneumothorax. The heart and mediastinal contours are unremarkable.   There is ventriculoperitoneal shunt catheter tubing noted.   The osseous structures are unremarkable.   IMPRESSION: No active cardiopulmonary disease.    Assessment/Plan: Encounter Diagnoses  Name Primary?   Chronic cough Yes    Phlegm in throat    Chronic nasal congestion    Chronic GERD    Post-nasal drip     Assessment and Plan Assessment & Plan Chronic cough, productive with postnasal drainage and suspected gastroesophageal reflux Chronic cough with phlegm likely due to postnasal drainage and suspected gastroesophageal reflux. Albuterol  inhaler provides relief. Differential includes postnasal drainage and reflux vs lung etiology, sent by Pulm Dr Darlean. History not c/w neurogenic cough - Provided information about reflux management in the after visit summary. Continue current medications for reflux (Protonix  and Pepcid ) - Prescribed Xyzal 5 mg daily - Advised starting Flonase after nasal bleeding resolves, with instructions to aim laterally to avoid the septum. - we discussed to see Dr Darlean if cough does not resolve to obtain PFTs and chest imaging   Epistaxis due to nasal dryness Epistaxis on the left side attributed to nasal dryness, exacerbated by cold season. No blood thinners or nasal sprays used. No nasal packing in the past or other interventions - Advised use of saline spray 6-8 times a day. - Recommended applying Vaseline to both sides of the nose in the morning and at night. - Instructed to avoid digital trauma to the front of the nose.      Thank you for allowing me to participate in the care of this patient. Please do not hesitate to contact me with any questions or concerns.   Elena Larry, MD Otol[facility]ology [facility] Phone: [phone] Fax: [phone]    07/09/2024, 9:53 AM

## 2024-07-09 NOTE — Patient Instructions (Addendum)
 Epistaxis prevention instructions given to the patient: - use nasal saline spray x6/day and Vaseline twice a day to prevent nose bleeds - for active nose bleeds use Afrin and nasal tip pressure x 10 min to stop them - if nose bleed does not stop with above measures, please go to Emergency Room  - please see your primary care provider to check your blood pressure and make sure it is under control - return for recurrent nose bleeds and we will consider cautery of your nasal blood vessels  - Purchase BleedStop to have at home and help with epistaxis control   GamingLesson.nl - check out this website to learn more about reflux   -Avoid lying down for at least two hours after a meal or after drinking acidic beverages, like soda, or other caffeinated beverages. This can help to prevent stomach contents from flowing back into the esophagus. -Keep your head elevated while you sleep. Using an extra pillow or two can also help to prevent reflux. -Eat smaller and more frequent meals each day instead of a few large meals. This promotes digestion and can aid in preventing heartburn. -Wear loose-fitting clothes to ease pressure on the stomach, which can worsen heartburn and reflux. -Reduce excess weight around the midsection. This can ease pressure on the stomach. Such pressure can force some stomach contents back up the esophagus - Take Reflux Gourmet (natural supplement available on Amazon) to help with symptoms of chronic throat irritation      MUCUS MANAGEMENT  Several factors can cause the sensation of increased mucus in the throat, including dryness, acid reflux, and increased mucus production from allergies or chronic sinus drainage.  There is also some evidence that added sugars or processed sugars in the diet (not the kind that occur naturally in honey or ripe fruit) can increase mucus, as well as too much dairy or refined carbohydrates. To avoid these refined carbohydrates, on food labels,  watch out for "wheat flour" (also called "white," "refined" or "enriched" flour) on the ingredients list. The below website has several good options for mucus management.  leedsportal.com.pdf  Some recommendations: -Water, water, water -Cut back on caffeine -Steam treatments during the day (Personal QUALCOMM are available at [employer] and drugstores. Amazon sells one called Vick's steam inhaler that people like. A cheaper alternative is a bowl of warm water with a towel over your head. You can breathe in the steam for a couple of minutes, especially if you are about to use your voice a lot, or when you are feeling particularly dry in your throat or mouth.) -Humidifier at night. Put this right next to your head so that the mist falls on your face. Do be aware that indoor dampness can promote bacteria, mold, and dust mite growth, so if you have a dust mite allergy you should be sure to use mattress and pillow covers and avoid excessive humidification. -Prevention of acid reflux by avoiding late-night eating (nothing 3 hours before laying down at night), greasy and spicy foods, and acidic foods -Mucinex  (over-the-counter, generic name guaifenesin . Buy the kind without a cough suppressant or decongestant added). This medicine doesn't work well if you are not well hydrated, so drink plenty of water on days when you take it. -Xylimelts -Nasal saline spray such as Simply Saline (or any nasal saline without preservatives, in 0.9% concentration) -Nasal saline irrigations with NeilMed rinse kit, Neti Pot, Active Sinus, or Nasopure irrigation bottles (available in any pharmacy or grocery store, and all available  on amazon) -Avoid the things you are allergic to if you have allergies (use dust mite covers on your bed, wash your hair at night instead of morning if you have pollen allergies) - see this website for more information on managing allergies if this is a  problem for you: www.sinusallergy.http://hicks.info/ -Hall's Breezers (sugar-free), Grether's black currant pastilles, and Entertainer's Secret Throat Relief can all help dry mouth and thickened mucous. See website above for details. -If you smoke, stop. -Low-processed-sugar diet. Processed sugars may increase mucus and even generalized inflammation in the body. The FDA is changing the way they label foods soon so that it's easier to tell when sugars occur naturally in the food (which is not harmful to our health) vs when processed sugars or syrups have been added to the food (which may increase inflammation in the body).  In the meantime, you can research the plant-based diet, anti-inflammatory diet, mediterranean diet, or the paleo diet to get an idea of foods that you might want to avoid. -Dairy - some patients find that eating a lot of dairy products worsens their mucus. -Sleep apnea - if you have sleep apnea and don't treat it, consider starting up your CPAP again (and be sure to use the humidification). Snoring and struggling to keep your airway open all night long is traumatizing to the lining of your throat and can increase irritation.           Travel steam inhalers/humidifiers: www.amandaflynnvoice.com http://www.amazon.com/Air-O-Swiss-7146-Travel-Ultrasonic-Humidifier/dp/B001JL4LZ4? https://www.jenkins-webster.com/

## 2024-07-19 DIAGNOSIS — R9389 Abnormal findings on diagnostic imaging of other specified body structures: Secondary | ICD-10-CM | POA: Diagnosis not present

## 2024-07-19 DIAGNOSIS — J9811 Atelectasis: Secondary | ICD-10-CM | POA: Diagnosis not present

## 2024-07-19 DIAGNOSIS — R0602 Shortness of breath: Secondary | ICD-10-CM | POA: Diagnosis not present

## 2024-09-04 ENCOUNTER — Telehealth: Payer: Self-pay

## 2024-09-04 NOTE — Telephone Encounter (Signed)
 Copied from CRM #[id number]. Topic: Clinical - Medical Advice >> Sep 03, 2024  2:38 PM [name] wrote: Reason for CRM: PCP's office of patient is calling to state that patient is, per PCP evaluation, not well controlled in regards to wheezing and would like patient to be assessed.   ATC X1. Unable to lvm as vm is not set up

## 2024-09-08 ENCOUNTER — Telehealth: Payer: Self-pay

## 2024-09-08 NOTE — Telephone Encounter (Signed)
 Copied from CRM [id number]. Topic: Clinical - Medical Advice >> Sep 07, 2024  9:23 AM Joesph PARAS wrote: Reason for CRM: Dr. Lari would like Dr. Darlean to know that Dr.Burdine is trying to get patient enrolled in care bridge for medication adherence concerns.

## 2024-09-08 NOTE — Telephone Encounter (Signed)
 ATC x2.  No VM set up.  He already has an appointment with Dr. Darlean on 09/23/24 at 9:45 am.  Closing encounter per policy d/t unable to reach patient.

## 2024-09-23 ENCOUNTER — Encounter: Payer: Self-pay | Admitting: Internal Medicine

## 2024-09-23 ENCOUNTER — Ambulatory Visit: Admitting: Internal Medicine

## 2024-09-23 VITALS — BP 142/89 | HR 107 | Ht 68.0 in | Wt 244.0 lb

## 2024-09-23 DIAGNOSIS — R058 Other specified cough: Secondary | ICD-10-CM

## 2024-09-23 DIAGNOSIS — J4489 Other specified chronic obstructive pulmonary disease: Secondary | ICD-10-CM | POA: Insufficient documentation

## 2024-09-23 MED ORDER — AMOXICILLIN-POT CLAVULANATE 875-125 MG PO TABS: ORAL_TABLET | ORAL | 0 refills | Status: AC

## 2024-09-23 MED ORDER — BUDESONIDE-FORMOTEROL FUMARATE 80-4.5 MCG/ACT IN AERO: INHALATION_SPRAY | RESPIRATORY_TRACT | 12 refills | Status: AC

## 2024-09-23 MED ORDER — BREZTRI AEROSPHERE 160-9-4.8 MCG/ACT IN AERO: 2.0000 | INHALATION_SPRAY | Freq: Two times a day (BID) | RESPIRATORY_TRACT | Status: DC

## 2024-09-23 MED ORDER — PANTOPRAZOLE SODIUM 40 MG PO TBEC: 40.0000 mg | DELAYED_RELEASE_TABLET | Freq: Every day | ORAL | 2 refills | Status: AC

## 2024-09-23 MED ORDER — PREDNISONE 10 MG PO TABS: ORAL_TABLET | ORAL | 0 refills | Status: AC

## 2024-09-23 MED ORDER — AMOXICILLIN-POT CLAVULANATE 875-125 MG PO TABS: 1.0000 | ORAL_TABLET | Freq: Two times a day (BID) | ORAL | 0 refills | Status: DC

## 2024-09-23 NOTE — Patient Instructions (Signed)
 Continue Singulair  (montelukast )  and pepcid  20mg  (famotidine )  an hour before bedtime   Augmentin  875 mg take one pill twice daily  X 21 days - take at breakfast and supper with large glass of water.  It would help reduce the usual side effects (diarrhea and yeast infections) if you ate cultured yogurt at lunch.  ADD  Pantoprazole  40 mg  Take 30-60 min before first meal of the day    Prednisone  10 mg take  4 each am x 2 days,   2 each am x 2 days,  1 each am x 2 days and stop   Symbicort  80 (ketshup)  or Breztri  (mustard) on or the other Take 2 puffs first thing in am and then another 2 puffs about 12 hours later.   Work on inhaler technique:  relax and gently blow all the way out then take a nice smooth full deep breath back in, triggering the inhaler at same time you start breathing in.  Hold breath in for at least  5 seconds if you can. Blow out thru thru nose. Rinse and gargle with water when done.  If mouth or throat bother you at all,  try brushing teeth/gums/tongue with arm and hammer toothpaste/ make a slurry and gargle and spit out.   Use your albuterol  as a rescue medication to be used if you can't catch your breath by resting, slowing your pace,  or doing a relaxed purse lip breathing pattern.  - The less you use it, the better it will work when you need it. - Ok to use up to 2 puffs  every 4 hours if you must but call for  appointment if use goes up over your usual need - Don't leave home without it !!  (think of it like a spare tire or starter fluid for your car)     Please schedule a follow up visit in 3 weeks  but call sooner if needed  with all medications /inhalers/ solutions in hand so we can verify exactly what you are taking. This includes all medications from all doctors and over the counters

## 2024-09-23 NOTE — Progress Notes (Addendum)
 "   [name], male    DOB: 1956-04-30    MRN: [id number]   Brief patient profile:  19   yobm never  smoker with  referred to pulmonary clinic in [place]  03/09/2024 by Dr Lari for wheezing /coughing and nasal congestion sob  onset summer 2024  but has been inhalers since in his 68s   R HDelevation since at least 2012 and paced on advair in his 62s ( not really sure it helped)   Last prednisone  was p ER end of May  2025 seemed to help transiently    History of Present Illness  03/09/2024  Pulmonary/ 1st office eval/ Kirsi Hugh / [place] [facility] on trlegy and lisinopril   Chief Complaint  Patient presents with   Establish Care    DOE  Dyspnea:  neighborhood walker same speed as others Cough: nonproductive assoc with nasal congestion esp at hs  Sleep: bed is flat , head up on 3 pillows with cough hs x several min  SABA use: rarely needed  02: none  Rec Try off trelegy and lisinopril   Start olmesartan  40 mg one daily in place of lisinopril   If breathing worsens and you need more nebulizer after you finish prednisone  start back on  trelegy  Prednisone  10 mg take  4 each am x 2 days,  2 x 3 days,  2 each am x 2 days,  1 each am x 2 days and stop  Omnicef  300 mg twice daily x 10 days   Please schedule a follow up office visit in 4 weeks, sooner if needed  with all medications   04/08/2024  f/u ov/[place] office/Ellia Knowlton re: UACS  ? Acei case  maint on no rx / did not bring   meds  Chief Complaint  Patient presents with   Follow-up    Increased SOB and wheezing over the past wk. Neb not helping. He has occ dry cough.   Dyspnea:  much better p prednisone  then worse p completed and no better p neb  Cough: assoc with nasal congestion and light yellow and streaks of blood - not change p omnicef   Sleeping: still flat on bed and 3 pillows  and  having new toubles at night with cough and wheeze and sob  SABA use: duoneb not helping  02: none  Also choking on food (points to SS notch where  food is blocked)  Rec Take 4 for three days 3 for three days 2 for three days 1 for three days and stop  Pantoprazole  (protonix ) 40 mg   Take  30-60 min before first meal of the day and Pepcid  (famotidine )  20 mg after supper until return to office   GERD diet reviewed, bed blocks rec   My office will be contacting you by phone for referral to [facility]  > sept 2025    04/20/2024  f/u ov/[place] [facility]/Irisa Grimsley re: UACS maint on Protonix  40 ac/ and pepcid  p supper   Chief Complaint  Patient presents with   Follow-up   Cough  Dyspnea:  denies limiting doe unless really overdoes it  Cough: better than it was/ sporadic now  Sleeping: able to lie flat bed 3 pillows due to choking s  resp cc  SABA use: just puffer once or twice a week never pre-exercise  02: none  Rec Also  Ok to try albuterol  15 min before an activity (on alternating days)  that you know would usually make you short of breath   My  office will be contacting you by phone for referral for PFTs  at [facility] not done as of 06/18/2024    Pulmonary follow up is as needed after you complete your GI and ENT work up if not 100% satisfied with your cough and breathing.(Did not call for eval)    06/18/2024  f/u ov/[place] office/Sharry Beining re: noct cough / doe maint on just ppi qam and prn saba   Chief Complaint  Patient presents with   Shortness of Breath    SOB.  Would like to get oxygen   Dyspnea:  avg pace [employer]  Cough: more than usual assoc with nasal congestion esp on L foreever Lts of nasal d/c yellow green each am x 6 m  Sleeping: flat bed/ 3 pillows with 2-3 x per week uses fan  SABA use: mostly at bedtime or noct awakeing  02: none Patient Instructions  Prednisone  Take 4 for three days 3 for three days 2 for three days 1 for three days and stop  Start singulair  (montelukast )  and pepcid  20mg  (famotidine   an hour before bedtime  Augmentin  875 mg take one pill twice daily  X 14 days -  My office will be  contacting you by phone for referral to [facility] in [place]  and PFTs  in [place]   - if you don't hear back from my office within one week please call us  back or notify us  thru MyChart and we'll address it right away.   Please schedule a follow up visit in 3 months but call sooner if needed  with all medications and inhaler    ENT eval [facility]  07/09/24  c/w gerd/ pnds   09/23/2024  f/u ov/[place] office/Stark Aguinaga re: noct cough/ choking  maint on ?  did not  bring meds Chief Complaint  Patient presents with   Cough    Dark green/brown  Shob   Dyspnea:  walking at [employer] ok  Cough: 1st thing in am worse was better p 14 day augmentin  then gradually worse assoc with purulent nasal d/c Sleeping: flat bed / 3pillows or can't breath x years   SABA use: not clear  02: none    No obvious day to day or daytime variability or assoc  r mucus plugs or hemoptysis or cp or chest tightness, or overt  hb symptoms.   Also denies any obvious fluctuation of symptoms with weather or environmental changes or other aggravating or alleviating factors except as outlined above   No unusual exposure hx or h/o childhood pna/ asthma or knowledge of premature birth.  Current Allergies, Complete Past Medical History, Past Surgical History, Family History, and Social History were reviewed in [employer] record.  ROS  The following are not active complaints unless bolded Hoarseness, sore throat, dysphagia, dental problems, itching, sneezing,  nasal congestion or discharge of excess mucus or purulent secretions, ear ache,   fever, chills, sweats, unintended wt loss or wt gain, classically pleuritic or exertional cp,  orthopnea pnd or arm/hand swelling  or leg swelling, presyncope, palpitations, abdominal pain, anorexia, nausea, vomiting, diarrhea  or change in bowel habits or change in bladder habits, change in stools or change in urine, dysuria, hematuria,  rash, arthralgias, visual  complaints, headache, numbness, weakness or ataxia or problems with walking or coordination,  change in mood or  memory.         Outpatient Medications Prior to Visit -  - NOTE:   Unable to verify as accurately reflecting  what pt takes  l  Medication Sig Dispense Refill   albuterol  (PROVENTIL ) (2.5 MG/3ML) 0.083% nebulizer solution Take 3 mLs (2.5 mg total) by nebulization 3 (three) times daily. 75 mL 12   albuterol  (VENTOLIN  HFA) 108 (90 Base) MCG/ACT inhaler Inhale 1 puff into the lungs every 4 (four) hours as needed.     amLODipine  (NORVASC ) 10 MG tablet Take 10 mg by mouth daily.     budesonide  (PULMICORT ) 0.25 MG/2ML nebulizer solution Combine with albuterol  in nebulizer four times daily if needed or just twice daily once better 240 mL 12   DULoxetine (CYMBALTA) 20 MG capsule Take 20 mg by mouth daily.     famotidine  (PEPCID ) 20 MG tablet One after supper 30 tablet 11   fluticasone  (FLONASE ) 50 MCG/ACT nasal spray Place 2 sprays into both nostrils daily. 16 g 6   oxyCODONE  (OXY IR/ROXICODONE ) 5 MG immediate release tablet Take 5 mg by mouth 4 (four) times daily as needed.     sodium chloride  (OCEAN) 0.65 % SOLN nasal spray Place 1 spray into both nostrils as needed. 30 mL 5   amoxicillin -clavulanate (AUGMENTIN ) 875-125 MG tablet Take 1 tablet by mouth 2 (two) times daily. (Patient not taking: Reported on 09/23/2024) 28 tablet 0   hydrochlorothiazide  (HYDRODIURIL ) 25 MG tablet Take 25 mg by mouth daily. (Patient not taking: Reported on 09/23/2024)     levocetirizine (XYZAL  ALLERGY 24HR) 5 MG tablet Take 1 tablet (5 mg total) by mouth every evening. (Patient not taking: Reported on 09/23/2024) 30 tablet 3   montelukast  (SINGULAIR ) 10 MG tablet Take 1 tablet (10 mg total) by mouth at bedtime. (Patient not taking: Reported on 09/23/2024) 30 tablet 11   olmesartan  (BENICAR ) 40 MG tablet Take 1 tablet (40 mg total) by mouth daily. (Patient not taking: Reported on 09/23/2024) 30 tablet 11    pantoprazole  (PROTONIX ) 40 MG tablet Take 1 tablet (40 mg total) by mouth daily. (Patient not taking: Reported on 09/23/2024) 30 tablet 11   predniSONE  (DELTASONE ) 10 MG tablet Take 4 for three days 3 for three days 2 for three days 1 for three days and stop (Patient not taking: Reported on 09/23/2024) 30 tablet 0   spironolactone (ALDACTONE) 25 MG tablet Take 25 mg by mouth.     No facility-administered medications prior to visit.         Past Medical History:  Diagnosis Date   Abdominal hernia    Asthma    BPH (benign prostatic hyperplasia)    Chronic pain    COPD (chronic obstructive pulmonary disease) (HCC)    Diabetes mellitus    Diastolic CHF (HCC)    Headache(784.0)    chronic, daily   Hyperlipidemia    Hypertension    Substance abuse (HCC)    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 Cocaine      Objective:    Wts  09/23/2024     244 06/18/2024       228 04/20/2024       227   04/08/24 229 lb 9.6 oz (104.1 kg)  03/11/24 230 lb 3.2 oz (104.4 kg)  03/09/24 226 lb 3.2 oz (102.6 kg)     Vital signs reviewed  09/23/2024  - Note at rest 02 sats  96% on RA   General appearance:    amb bm with prominent upper airway wheeze        HEENT : Oropharynx  clear/ edentulous      Nasal turbinates mild edema /erythema, minimal MP discharge.  NECK :  without  apparent JVD/ palpable Nodes/TM    LUNGS: no acc muscle use,  Nl contour chest  with decreased BS bases  R> L   CV:  RRR  no s3 or murmur or increase in P2, and no edema   ABD:  soft and nontender   MS:  Gait nl   ext warm without deformities Or obvious joint restrictions  calf tenderness, cyanosis or clubbing    SKIN: warm and dry without lesions    NEURO:  alert, approp, nl sensorium with  no motor or cerebellar deficits apparent.         I personally reviewed images and agree with radiology impression as follows:   Chest CTa  04/01/24    Wnl    I personally reviewed images and agree with radiology impression as follows:   CXR:   portable 06/07/24 1.    Low inspiratory result with mild to moderate bibasilar atelectasis, right greater than left, associated with persistent mild to moderate elevation of the right hemidiaphragm.  2.    Mild bilateral apical pulmonary vascular redistribution suggestive of pulmonary vascular congestion. Recommend clinical correlation.   BNP 06/07/24  = 244     Assessment     Assessment & Plan Upper airway cough syndrome Never smoker Onset ? 2024 while on acei an advair  then Trelegy > d/c'd both 03/09/24  - stop acei 03/09/2024 and rx empirically for rhinitis/sinustis with omnicef  and pred x 8 d> improved but not resolved  - Allergy screen 03/09/2024 >  Eos 0.1/  IgE  369 - 04/08/2024 pred x 12 days/ start gerd rx and ENT consult> reconsulted 06/18/2024  - 06/18/2024  repeat pred x 12 days and start singulair  for nasal congestion / pseudowheeze > ent eval p 14 days of AUGMENTIN  and hold off inhalers for now and just use saba hfa/ nebs to get less Upper airway irritation   --ENT eval [facility]  07/09/24  c/w gerd/ pnds   - 09/23/2024 rx augmentin  x 21 days then sinus ct if not better  >>> 09/23/2024 max gerd rx    Asthmatic bronchitis , chronic (HCC) Never smoker Onset ? 2024 while on acei an advair  then Trelegy > d/c'd both 03/09/24  - stop acei 03/09/2024 and rx empirically for rhinitis/sinustis with omnicef  and pred x 8 d> improved but not resolved  - Allergy screen 03/09/2024 >  Eos 0.1/  IgE  369 - 06/18/2024  repeat pred x 12 days and start singulair  for nasal congestion / pseudowheeze > ent eval p 14 days of AUGMENTIN  and hold off inhalers for now and just use saba hfa/ nebs to get less Upper airway irritation   PFT's  06/23/24  FEV1 1.66 (53 % ) ratio 0.63  p 41 % improvement from saba p saba prior to study with DLCO  18.65 (75%)   and FV curve classic and ERV 30%  at wt 230  - 09/23/2024  After extensive coaching inhaler device,  effectiveness =    60% from a baseline of 30%   with breztri sample  Rec >>> symbicort  80 2bid  (the lower dose as he appears to have component of VCD./ pseudohweeze) and use breztri sample for training only  >>> ccntinue singulair   >>> Prednisone  10 mg take  4 each am x 2 days,   2 each am x 2 days,  1 each am x 2 days and stop    F/u 3 weeks with all meds  in hand using a trust but verify approach to confirm accurate Medication  Reconciliation The principal here is that until we are certain that the  patients are doing what we've asked, it makes no sense to ask them to do more.      Each maintenance medication was reviewed in detail including emphasizing most importantly the difference between maintenance and prns and under what circumstances the prns are to be triggered using an action plan format where appropriate.  Total time for H and P, chart review, counseling, reviewing hfa  device(s) and generating customized AVS unique to this office visit / same day charting = 40 min  for pt with  multiple  refractory respiratory  symptoms of uncertain etiology       AVS  Patient Instructions  Continue Singulair  (montelukast )  and pepcid  20mg  (famotidine )  an hour before bedtime   Augmentin  875 mg take one pill twice daily  X 21 days - take at breakfast and supper with large glass of water.  It would help reduce the usual side effects (diarrhea and yeast infections) if you ate cultured yogurt at lunch.  ADD  Pantoprazole  40 mg  Take 30-60 min before first meal of the day    Prednisone  10 mg take  4 each am x 2 days,   2 each am x 2 days,  1 each am x 2 days and stop   Symbicort  80 (ketshup)  or Breztri  (mustard) on or the other Take 2 puffs first thing in am and then another 2 puffs about 12 hours later.   Work on inhaler technique:  relax and gently blow all the way out then take a nice smooth full deep breath back in, triggering the inhaler at same time you start breathing in.  Hold breath in for at least  5 seconds if you can. Blow out  thru thru nose. Rinse and gargle with water when done.  If mouth or throat bother you at all,  try brushing teeth/gums/tongue with arm and hammer toothpaste/ make a slurry and gargle and spit out.   Use your albuterol  as a rescue medication to be used if you can't catch your breath by resting, slowing your pace,  or doing a relaxed purse lip breathing pattern.  - The less you use it, the better it will work when you need it. - Ok to use up to 2 puffs  every 4 hours if you must but call for  appointment if use goes up over your usual need - Don't leave home without it !!  (think of it like a spare tire or starter fluid for your car)     Please schedule a follow up visit in 3 weeks  but call sooner if needed  with all medications /inhalers/ solutions in hand so we can verify exactly what you are taking. This includes all medications from all doctors and over the counters       Ozell America, MD 09/23/2024          "

## 2024-09-23 NOTE — Assessment & Plan Note (Addendum)
 Never smoker Onset ? 2024 while on acei an advair  then Trelegy > d/c'd both 03/09/24  - stop acei 03/09/2024 and rx empirically for rhinitis/sinustis with omnicef  and pred x 8 d> improved but not resolved  - Allergy screen 03/09/2024 >  Eos 0.1/  IgE  369 - 06/18/2024  repeat pred x 12 days and start singulair  for nasal congestion / pseudowheeze > ent eval p 14 days of AUGMENTIN  and hold off inhalers for now and just use saba hfa/ nebs to get less Upper airway irritation   PFT's  06/23/24  FEV1 1.66 (53 % ) ratio 0.63  p 41 % improvement from saba p saba prior to study with DLCO  18.65 (75%)   and FV curve classic and ERV 30%  at wt 230  - 09/23/2024  After extensive coaching inhaler device,  effectiveness =    60% from a baseline of 30%  with breztri sample  Rec >>> symbicort  80 2bid  (the lower dose as he appears to have component of VCD./ pseudohweeze) and use breztri sample for training only  >>> ccntinue singulair   >>> Prednisone  10 mg take  4 each am x 2 days,   2 each am x 2 days,  1 each am x 2 days and stop

## 2024-09-23 NOTE — Assessment & Plan Note (Addendum)
 Never smoker Onset ? 2024 while on acei an advair  then Trelegy > d/c'd both 03/09/24  - stop acei 03/09/2024 and rx empirically for rhinitis/sinustis with omnicef  and pred x 8 d> improved but not resolved  - Allergy screen 03/09/2024 >  Eos 0.1/  IgE  369 - 04/08/2024 pred x 12 days/ start gerd rx and ENT consult> reconsulted 06/18/2024  - 06/18/2024  repeat pred x 12 days and start singulair  for nasal congestion / pseudowheeze > ent eval p 14 days of AUGMENTIN  and hold off inhalers for now and just use saba hfa/ nebs to get less Upper airway irritation   --ENT eval [facility]  07/09/24  c/w gerd/ pnds   - 09/23/2024 rx augmentin  x 21 days then sinus ct if not better  >>> 09/23/2024 max gerd rx

## 2024-10-07 ENCOUNTER — Ambulatory Visit: Admitting: Family Medicine

## 2024-10-07 ENCOUNTER — Ambulatory Visit: Payer: Self-pay | Admitting: Family Medicine
